# Patient Record
Sex: Male | Born: 1940 | Race: Black or African American | Hispanic: No | Marital: Married | Smoking: Former smoker
Health system: Southern US, Community
[De-identification: ages and names within clinical notes are randomized; demographics above are authoritative.]

## PROBLEM LIST (undated history)

## (undated) DIAGNOSIS — I2699 Other pulmonary embolism without acute cor pulmonale: Secondary | ICD-10-CM

## (undated) DIAGNOSIS — J90 Pleural effusion, not elsewhere classified: Secondary | ICD-10-CM

## (undated) DIAGNOSIS — I10 Essential (primary) hypertension: Secondary | ICD-10-CM

## (undated) DIAGNOSIS — E119 Type 2 diabetes mellitus without complications: Secondary | ICD-10-CM

## (undated) DIAGNOSIS — I82409 Acute embolism and thrombosis of unspecified deep veins of unspecified lower extremity: Secondary | ICD-10-CM

## (undated) HISTORY — PX: HERNIA REPAIR: SHX51

---

## 2015-10-26 DIAGNOSIS — G4733 Obstructive sleep apnea (adult) (pediatric): Secondary | ICD-10-CM | POA: Diagnosis not present

## 2015-10-26 DIAGNOSIS — Z6841 Body Mass Index (BMI) 40.0 and over, adult: Secondary | ICD-10-CM | POA: Diagnosis not present

## 2015-10-26 DIAGNOSIS — I10 Essential (primary) hypertension: Secondary | ICD-10-CM | POA: Diagnosis not present

## 2015-10-30 DIAGNOSIS — G4733 Obstructive sleep apnea (adult) (pediatric): Secondary | ICD-10-CM | POA: Diagnosis not present

## 2015-11-02 DIAGNOSIS — G4733 Obstructive sleep apnea (adult) (pediatric): Secondary | ICD-10-CM | POA: Diagnosis not present

## 2015-11-30 DIAGNOSIS — G4733 Obstructive sleep apnea (adult) (pediatric): Secondary | ICD-10-CM | POA: Diagnosis not present

## 2015-12-22 DIAGNOSIS — H26493 Other secondary cataract, bilateral: Secondary | ICD-10-CM | POA: Diagnosis not present

## 2015-12-22 DIAGNOSIS — H5213 Myopia, bilateral: Secondary | ICD-10-CM | POA: Diagnosis not present

## 2015-12-22 DIAGNOSIS — H40013 Open angle with borderline findings, low risk, bilateral: Secondary | ICD-10-CM | POA: Diagnosis not present

## 2015-12-22 DIAGNOSIS — H524 Presbyopia: Secondary | ICD-10-CM | POA: Diagnosis not present

## 2015-12-22 DIAGNOSIS — H52223 Regular astigmatism, bilateral: Secondary | ICD-10-CM | POA: Diagnosis not present

## 2015-12-28 DIAGNOSIS — G4733 Obstructive sleep apnea (adult) (pediatric): Secondary | ICD-10-CM | POA: Diagnosis not present

## 2016-01-28 DIAGNOSIS — G4733 Obstructive sleep apnea (adult) (pediatric): Secondary | ICD-10-CM | POA: Diagnosis not present

## 2016-02-27 DIAGNOSIS — G4733 Obstructive sleep apnea (adult) (pediatric): Secondary | ICD-10-CM | POA: Diagnosis not present

## 2016-03-29 DIAGNOSIS — G4733 Obstructive sleep apnea (adult) (pediatric): Secondary | ICD-10-CM | POA: Diagnosis not present

## 2016-04-12 DIAGNOSIS — J209 Acute bronchitis, unspecified: Secondary | ICD-10-CM | POA: Diagnosis not present

## 2016-04-12 DIAGNOSIS — I10 Essential (primary) hypertension: Secondary | ICD-10-CM | POA: Diagnosis not present

## 2016-04-12 DIAGNOSIS — Z6841 Body Mass Index (BMI) 40.0 and over, adult: Secondary | ICD-10-CM | POA: Diagnosis not present

## 2016-04-28 DIAGNOSIS — G4733 Obstructive sleep apnea (adult) (pediatric): Secondary | ICD-10-CM | POA: Diagnosis not present

## 2016-05-29 DIAGNOSIS — G4733 Obstructive sleep apnea (adult) (pediatric): Secondary | ICD-10-CM | POA: Diagnosis not present

## 2016-06-28 DIAGNOSIS — H26493 Other secondary cataract, bilateral: Secondary | ICD-10-CM | POA: Diagnosis not present

## 2016-06-28 DIAGNOSIS — H5213 Myopia, bilateral: Secondary | ICD-10-CM | POA: Diagnosis not present

## 2016-06-28 DIAGNOSIS — H524 Presbyopia: Secondary | ICD-10-CM | POA: Diagnosis not present

## 2016-06-28 DIAGNOSIS — H52223 Regular astigmatism, bilateral: Secondary | ICD-10-CM | POA: Diagnosis not present

## 2016-06-28 DIAGNOSIS — H40013 Open angle with borderline findings, low risk, bilateral: Secondary | ICD-10-CM | POA: Diagnosis not present

## 2016-06-29 DIAGNOSIS — G4733 Obstructive sleep apnea (adult) (pediatric): Secondary | ICD-10-CM | POA: Diagnosis not present

## 2016-07-29 DIAGNOSIS — G4733 Obstructive sleep apnea (adult) (pediatric): Secondary | ICD-10-CM | POA: Diagnosis not present

## 2016-08-01 DIAGNOSIS — G4733 Obstructive sleep apnea (adult) (pediatric): Secondary | ICD-10-CM | POA: Diagnosis not present

## 2016-08-29 DIAGNOSIS — G4733 Obstructive sleep apnea (adult) (pediatric): Secondary | ICD-10-CM | POA: Diagnosis not present

## 2016-09-28 DIAGNOSIS — G4733 Obstructive sleep apnea (adult) (pediatric): Secondary | ICD-10-CM | POA: Diagnosis not present

## 2016-10-29 DIAGNOSIS — G4733 Obstructive sleep apnea (adult) (pediatric): Secondary | ICD-10-CM | POA: Diagnosis not present

## 2016-11-05 DIAGNOSIS — M79672 Pain in left foot: Secondary | ICD-10-CM | POA: Diagnosis not present

## 2016-11-05 DIAGNOSIS — Z6841 Body Mass Index (BMI) 40.0 and over, adult: Secondary | ICD-10-CM | POA: Diagnosis not present

## 2016-12-27 DIAGNOSIS — H524 Presbyopia: Secondary | ICD-10-CM | POA: Diagnosis not present

## 2016-12-27 DIAGNOSIS — H40013 Open angle with borderline findings, low risk, bilateral: Secondary | ICD-10-CM | POA: Diagnosis not present

## 2016-12-27 DIAGNOSIS — H5213 Myopia, bilateral: Secondary | ICD-10-CM | POA: Diagnosis not present

## 2016-12-27 DIAGNOSIS — H26493 Other secondary cataract, bilateral: Secondary | ICD-10-CM | POA: Diagnosis not present

## 2016-12-27 DIAGNOSIS — H52223 Regular astigmatism, bilateral: Secondary | ICD-10-CM | POA: Diagnosis not present

## 2017-01-04 DIAGNOSIS — G4733 Obstructive sleep apnea (adult) (pediatric): Secondary | ICD-10-CM | POA: Diagnosis not present

## 2017-01-04 DIAGNOSIS — Z23 Encounter for immunization: Secondary | ICD-10-CM | POA: Diagnosis not present

## 2017-01-04 DIAGNOSIS — Z6841 Body Mass Index (BMI) 40.0 and over, adult: Secondary | ICD-10-CM | POA: Diagnosis not present

## 2017-01-04 DIAGNOSIS — H9313 Tinnitus, bilateral: Secondary | ICD-10-CM | POA: Diagnosis not present

## 2017-01-04 DIAGNOSIS — I10 Essential (primary) hypertension: Secondary | ICD-10-CM | POA: Diagnosis not present

## 2017-01-05 DIAGNOSIS — R7309 Other abnormal glucose: Secondary | ICD-10-CM | POA: Diagnosis not present

## 2017-01-09 DIAGNOSIS — E1165 Type 2 diabetes mellitus with hyperglycemia: Secondary | ICD-10-CM | POA: Diagnosis not present

## 2017-01-09 DIAGNOSIS — R739 Hyperglycemia, unspecified: Secondary | ICD-10-CM | POA: Diagnosis not present

## 2017-01-09 DIAGNOSIS — Z6841 Body Mass Index (BMI) 40.0 and over, adult: Secondary | ICD-10-CM | POA: Diagnosis not present

## 2017-01-09 DIAGNOSIS — I10 Essential (primary) hypertension: Secondary | ICD-10-CM | POA: Diagnosis not present

## 2017-01-12 DIAGNOSIS — Z6841 Body Mass Index (BMI) 40.0 and over, adult: Secondary | ICD-10-CM | POA: Diagnosis not present

## 2017-01-12 DIAGNOSIS — H903 Sensorineural hearing loss, bilateral: Secondary | ICD-10-CM | POA: Diagnosis not present

## 2017-01-12 DIAGNOSIS — H9193 Unspecified hearing loss, bilateral: Secondary | ICD-10-CM | POA: Diagnosis not present

## 2017-01-12 DIAGNOSIS — H9313 Tinnitus, bilateral: Secondary | ICD-10-CM | POA: Diagnosis not present

## 2017-02-01 DIAGNOSIS — E1165 Type 2 diabetes mellitus with hyperglycemia: Secondary | ICD-10-CM | POA: Diagnosis not present

## 2017-02-01 DIAGNOSIS — I10 Essential (primary) hypertension: Secondary | ICD-10-CM | POA: Diagnosis not present

## 2017-03-09 DIAGNOSIS — E1165 Type 2 diabetes mellitus with hyperglycemia: Secondary | ICD-10-CM | POA: Diagnosis not present

## 2017-03-09 DIAGNOSIS — Z6841 Body Mass Index (BMI) 40.0 and over, adult: Secondary | ICD-10-CM | POA: Diagnosis not present

## 2017-03-09 DIAGNOSIS — I10 Essential (primary) hypertension: Secondary | ICD-10-CM | POA: Diagnosis not present

## 2017-04-10 DIAGNOSIS — E784 Other hyperlipidemia: Secondary | ICD-10-CM | POA: Diagnosis not present

## 2017-04-10 DIAGNOSIS — E1165 Type 2 diabetes mellitus with hyperglycemia: Secondary | ICD-10-CM | POA: Diagnosis not present

## 2017-04-10 DIAGNOSIS — I10 Essential (primary) hypertension: Secondary | ICD-10-CM | POA: Diagnosis not present

## 2017-04-10 DIAGNOSIS — Z6841 Body Mass Index (BMI) 40.0 and over, adult: Secondary | ICD-10-CM | POA: Diagnosis not present

## 2017-07-04 DIAGNOSIS — H5213 Myopia, bilateral: Secondary | ICD-10-CM | POA: Diagnosis not present

## 2017-07-04 DIAGNOSIS — H26493 Other secondary cataract, bilateral: Secondary | ICD-10-CM | POA: Diagnosis not present

## 2017-07-04 DIAGNOSIS — H524 Presbyopia: Secondary | ICD-10-CM | POA: Diagnosis not present

## 2017-07-04 DIAGNOSIS — H52223 Regular astigmatism, bilateral: Secondary | ICD-10-CM | POA: Diagnosis not present

## 2017-07-04 DIAGNOSIS — H40013 Open angle with borderline findings, low risk, bilateral: Secondary | ICD-10-CM | POA: Diagnosis not present

## 2017-11-03 DIAGNOSIS — I10 Essential (primary) hypertension: Secondary | ICD-10-CM | POA: Diagnosis not present

## 2017-11-03 DIAGNOSIS — Z6841 Body Mass Index (BMI) 40.0 and over, adult: Secondary | ICD-10-CM | POA: Diagnosis not present

## 2017-11-03 DIAGNOSIS — Z87891 Personal history of nicotine dependence: Secondary | ICD-10-CM | POA: Diagnosis not present

## 2017-11-03 DIAGNOSIS — E785 Hyperlipidemia, unspecified: Secondary | ICD-10-CM | POA: Diagnosis not present

## 2017-11-03 DIAGNOSIS — E1165 Type 2 diabetes mellitus with hyperglycemia: Secondary | ICD-10-CM | POA: Diagnosis not present

## 2017-11-03 DIAGNOSIS — Z79899 Other long term (current) drug therapy: Secondary | ICD-10-CM | POA: Diagnosis not present

## 2017-11-29 DIAGNOSIS — G4733 Obstructive sleep apnea (adult) (pediatric): Secondary | ICD-10-CM | POA: Diagnosis not present

## 2017-12-05 DIAGNOSIS — G4733 Obstructive sleep apnea (adult) (pediatric): Secondary | ICD-10-CM | POA: Diagnosis not present

## 2018-01-02 DIAGNOSIS — H52223 Regular astigmatism, bilateral: Secondary | ICD-10-CM | POA: Diagnosis not present

## 2018-01-02 DIAGNOSIS — H26493 Other secondary cataract, bilateral: Secondary | ICD-10-CM | POA: Diagnosis not present

## 2018-01-02 DIAGNOSIS — H5213 Myopia, bilateral: Secondary | ICD-10-CM | POA: Diagnosis not present

## 2018-01-02 DIAGNOSIS — H40013 Open angle with borderline findings, low risk, bilateral: Secondary | ICD-10-CM | POA: Diagnosis not present

## 2018-01-02 DIAGNOSIS — H524 Presbyopia: Secondary | ICD-10-CM | POA: Diagnosis not present

## 2018-03-02 DIAGNOSIS — D125 Benign neoplasm of sigmoid colon: Secondary | ICD-10-CM | POA: Diagnosis not present

## 2018-03-02 DIAGNOSIS — K621 Rectal polyp: Secondary | ICD-10-CM | POA: Diagnosis not present

## 2018-03-02 DIAGNOSIS — D120 Benign neoplasm of cecum: Secondary | ICD-10-CM | POA: Diagnosis not present

## 2018-03-02 DIAGNOSIS — D123 Benign neoplasm of transverse colon: Secondary | ICD-10-CM | POA: Diagnosis not present

## 2018-03-02 DIAGNOSIS — Z1211 Encounter for screening for malignant neoplasm of colon: Secondary | ICD-10-CM | POA: Diagnosis not present

## 2018-03-02 DIAGNOSIS — K5730 Diverticulosis of large intestine without perforation or abscess without bleeding: Secondary | ICD-10-CM | POA: Diagnosis not present

## 2018-03-02 DIAGNOSIS — Z86010 Personal history of colonic polyps: Secondary | ICD-10-CM | POA: Diagnosis not present

## 2018-03-02 DIAGNOSIS — D124 Benign neoplasm of descending colon: Secondary | ICD-10-CM | POA: Diagnosis not present

## 2018-06-12 DIAGNOSIS — Z0000 Encounter for general adult medical examination without abnormal findings: Secondary | ICD-10-CM | POA: Diagnosis not present

## 2018-06-12 DIAGNOSIS — E785 Hyperlipidemia, unspecified: Secondary | ICD-10-CM | POA: Diagnosis not present

## 2018-06-12 DIAGNOSIS — Z87891 Personal history of nicotine dependence: Secondary | ICD-10-CM | POA: Diagnosis not present

## 2018-06-12 DIAGNOSIS — Z79899 Other long term (current) drug therapy: Secondary | ICD-10-CM | POA: Diagnosis not present

## 2018-06-12 DIAGNOSIS — Z7982 Long term (current) use of aspirin: Secondary | ICD-10-CM | POA: Diagnosis not present

## 2018-06-12 DIAGNOSIS — E1165 Type 2 diabetes mellitus with hyperglycemia: Secondary | ICD-10-CM | POA: Diagnosis not present

## 2018-06-12 DIAGNOSIS — I10 Essential (primary) hypertension: Secondary | ICD-10-CM | POA: Diagnosis not present

## 2018-07-06 DIAGNOSIS — R109 Unspecified abdominal pain: Secondary | ICD-10-CM | POA: Diagnosis not present

## 2018-07-06 DIAGNOSIS — B079 Viral wart, unspecified: Secondary | ICD-10-CM | POA: Diagnosis not present

## 2018-07-24 DIAGNOSIS — H26493 Other secondary cataract, bilateral: Secondary | ICD-10-CM | POA: Diagnosis not present

## 2018-07-24 DIAGNOSIS — H40013 Open angle with borderline findings, low risk, bilateral: Secondary | ICD-10-CM | POA: Diagnosis not present

## 2018-08-13 DIAGNOSIS — D234 Other benign neoplasm of skin of scalp and neck: Secondary | ICD-10-CM | POA: Diagnosis not present

## 2018-08-13 DIAGNOSIS — L821 Other seborrheic keratosis: Secondary | ICD-10-CM | POA: Diagnosis not present

## 2018-08-13 DIAGNOSIS — L918 Other hypertrophic disorders of the skin: Secondary | ICD-10-CM | POA: Diagnosis not present

## 2018-08-15 DIAGNOSIS — B078 Other viral warts: Secondary | ICD-10-CM | POA: Diagnosis not present

## 2018-09-14 DIAGNOSIS — G4733 Obstructive sleep apnea (adult) (pediatric): Secondary | ICD-10-CM | POA: Diagnosis not present

## 2018-12-12 DIAGNOSIS — E1165 Type 2 diabetes mellitus with hyperglycemia: Secondary | ICD-10-CM | POA: Diagnosis not present

## 2018-12-12 DIAGNOSIS — Z6841 Body Mass Index (BMI) 40.0 and over, adult: Secondary | ICD-10-CM | POA: Diagnosis not present

## 2018-12-12 DIAGNOSIS — E785 Hyperlipidemia, unspecified: Secondary | ICD-10-CM | POA: Diagnosis not present

## 2018-12-12 DIAGNOSIS — I10 Essential (primary) hypertension: Secondary | ICD-10-CM | POA: Diagnosis not present

## 2019-03-28 DIAGNOSIS — Z03818 Encounter for observation for suspected exposure to other biological agents ruled out: Secondary | ICD-10-CM | POA: Diagnosis not present

## 2019-04-02 DIAGNOSIS — H5213 Myopia, bilateral: Secondary | ICD-10-CM | POA: Diagnosis not present

## 2019-04-02 DIAGNOSIS — H40013 Open angle with borderline findings, low risk, bilateral: Secondary | ICD-10-CM | POA: Diagnosis not present

## 2019-04-02 DIAGNOSIS — H52223 Regular astigmatism, bilateral: Secondary | ICD-10-CM | POA: Diagnosis not present

## 2019-04-02 DIAGNOSIS — H524 Presbyopia: Secondary | ICD-10-CM | POA: Diagnosis not present

## 2019-04-02 DIAGNOSIS — H26493 Other secondary cataract, bilateral: Secondary | ICD-10-CM | POA: Diagnosis not present

## 2019-04-16 DIAGNOSIS — H26492 Other secondary cataract, left eye: Secondary | ICD-10-CM | POA: Diagnosis not present

## 2019-07-08 DIAGNOSIS — Z0000 Encounter for general adult medical examination without abnormal findings: Secondary | ICD-10-CM | POA: Diagnosis not present

## 2019-07-08 DIAGNOSIS — I10 Essential (primary) hypertension: Secondary | ICD-10-CM | POA: Diagnosis not present

## 2019-07-08 DIAGNOSIS — Z87891 Personal history of nicotine dependence: Secondary | ICD-10-CM | POA: Diagnosis not present

## 2019-07-08 DIAGNOSIS — E1165 Type 2 diabetes mellitus with hyperglycemia: Secondary | ICD-10-CM | POA: Diagnosis not present

## 2019-07-08 DIAGNOSIS — Z79899 Other long term (current) drug therapy: Secondary | ICD-10-CM | POA: Diagnosis not present

## 2019-07-08 DIAGNOSIS — G4733 Obstructive sleep apnea (adult) (pediatric): Secondary | ICD-10-CM | POA: Diagnosis not present

## 2019-07-08 DIAGNOSIS — E785 Hyperlipidemia, unspecified: Secondary | ICD-10-CM | POA: Diagnosis not present

## 2019-12-02 DIAGNOSIS — I10 Essential (primary) hypertension: Secondary | ICD-10-CM | POA: Diagnosis not present

## 2019-12-02 DIAGNOSIS — M792 Neuralgia and neuritis, unspecified: Secondary | ICD-10-CM | POA: Diagnosis not present

## 2019-12-02 DIAGNOSIS — M10072 Idiopathic gout, left ankle and foot: Secondary | ICD-10-CM | POA: Diagnosis not present

## 2019-12-02 DIAGNOSIS — M79673 Pain in unspecified foot: Secondary | ICD-10-CM | POA: Diagnosis not present

## 2019-12-02 DIAGNOSIS — M79672 Pain in left foot: Secondary | ICD-10-CM | POA: Diagnosis not present

## 2019-12-12 DIAGNOSIS — E782 Mixed hyperlipidemia: Secondary | ICD-10-CM | POA: Diagnosis not present

## 2019-12-12 DIAGNOSIS — E1165 Type 2 diabetes mellitus with hyperglycemia: Secondary | ICD-10-CM | POA: Diagnosis not present

## 2019-12-12 DIAGNOSIS — I10 Essential (primary) hypertension: Secondary | ICD-10-CM | POA: Diagnosis not present

## 2020-01-23 DIAGNOSIS — H35033 Hypertensive retinopathy, bilateral: Secondary | ICD-10-CM | POA: Diagnosis not present

## 2020-01-23 DIAGNOSIS — H43812 Vitreous degeneration, left eye: Secondary | ICD-10-CM | POA: Diagnosis not present

## 2020-01-23 DIAGNOSIS — H40023 Open angle with borderline findings, high risk, bilateral: Secondary | ICD-10-CM | POA: Diagnosis not present

## 2020-01-23 DIAGNOSIS — Z961 Presence of intraocular lens: Secondary | ICD-10-CM | POA: Diagnosis not present

## 2020-04-21 DIAGNOSIS — I10 Essential (primary) hypertension: Secondary | ICD-10-CM | POA: Diagnosis not present

## 2020-05-13 DIAGNOSIS — E1165 Type 2 diabetes mellitus with hyperglycemia: Secondary | ICD-10-CM | POA: Diagnosis not present

## 2020-05-13 DIAGNOSIS — I10 Essential (primary) hypertension: Secondary | ICD-10-CM | POA: Diagnosis not present

## 2020-05-13 DIAGNOSIS — E782 Mixed hyperlipidemia: Secondary | ICD-10-CM | POA: Diagnosis not present

## 2020-05-13 DIAGNOSIS — Z6841 Body Mass Index (BMI) 40.0 and over, adult: Secondary | ICD-10-CM | POA: Diagnosis not present

## 2020-05-15 DIAGNOSIS — H524 Presbyopia: Secondary | ICD-10-CM | POA: Diagnosis not present

## 2020-05-24 DIAGNOSIS — R040 Epistaxis: Secondary | ICD-10-CM | POA: Diagnosis not present

## 2020-05-27 DIAGNOSIS — R040 Epistaxis: Secondary | ICD-10-CM | POA: Diagnosis not present

## 2020-06-02 DIAGNOSIS — R040 Epistaxis: Secondary | ICD-10-CM | POA: Diagnosis not present

## 2020-06-03 DIAGNOSIS — R040 Epistaxis: Secondary | ICD-10-CM | POA: Diagnosis not present

## 2020-06-04 DIAGNOSIS — E1165 Type 2 diabetes mellitus with hyperglycemia: Secondary | ICD-10-CM | POA: Diagnosis not present

## 2020-06-04 DIAGNOSIS — E782 Mixed hyperlipidemia: Secondary | ICD-10-CM | POA: Diagnosis not present

## 2020-06-04 DIAGNOSIS — G4733 Obstructive sleep apnea (adult) (pediatric): Secondary | ICD-10-CM | POA: Diagnosis not present

## 2020-06-04 DIAGNOSIS — I10 Essential (primary) hypertension: Secondary | ICD-10-CM | POA: Diagnosis not present

## 2020-06-04 DIAGNOSIS — R040 Epistaxis: Secondary | ICD-10-CM | POA: Diagnosis not present

## 2020-06-23 DIAGNOSIS — H9313 Tinnitus, bilateral: Secondary | ICD-10-CM | POA: Diagnosis not present

## 2020-06-23 DIAGNOSIS — R040 Epistaxis: Secondary | ICD-10-CM | POA: Diagnosis not present

## 2020-08-20 DIAGNOSIS — Z961 Presence of intraocular lens: Secondary | ICD-10-CM | POA: Diagnosis not present

## 2020-08-20 DIAGNOSIS — H43812 Vitreous degeneration, left eye: Secondary | ICD-10-CM | POA: Diagnosis not present

## 2020-08-20 DIAGNOSIS — H40023 Open angle with borderline findings, high risk, bilateral: Secondary | ICD-10-CM | POA: Diagnosis not present

## 2020-08-20 DIAGNOSIS — H35033 Hypertensive retinopathy, bilateral: Secondary | ICD-10-CM | POA: Diagnosis not present

## 2020-09-01 DIAGNOSIS — R040 Epistaxis: Secondary | ICD-10-CM | POA: Diagnosis not present

## 2020-11-04 DIAGNOSIS — R040 Epistaxis: Secondary | ICD-10-CM | POA: Diagnosis not present

## 2020-11-04 DIAGNOSIS — H938X3 Other specified disorders of ear, bilateral: Secondary | ICD-10-CM | POA: Diagnosis not present

## 2020-11-04 DIAGNOSIS — Z7982 Long term (current) use of aspirin: Secondary | ICD-10-CM | POA: Diagnosis not present

## 2020-11-05 DIAGNOSIS — R040 Epistaxis: Secondary | ICD-10-CM | POA: Diagnosis not present

## 2020-12-09 DIAGNOSIS — Z8249 Family history of ischemic heart disease and other diseases of the circulatory system: Secondary | ICD-10-CM | POA: Diagnosis not present

## 2020-12-09 DIAGNOSIS — E1165 Type 2 diabetes mellitus with hyperglycemia: Secondary | ICD-10-CM | POA: Diagnosis not present

## 2020-12-09 DIAGNOSIS — Z86010 Personal history of colonic polyps: Secondary | ICD-10-CM | POA: Diagnosis not present

## 2020-12-09 DIAGNOSIS — E782 Mixed hyperlipidemia: Secondary | ICD-10-CM | POA: Diagnosis not present

## 2020-12-09 DIAGNOSIS — I10 Essential (primary) hypertension: Secondary | ICD-10-CM | POA: Diagnosis not present

## 2020-12-09 DIAGNOSIS — Z7984 Long term (current) use of oral hypoglycemic drugs: Secondary | ICD-10-CM | POA: Diagnosis not present

## 2020-12-09 DIAGNOSIS — Z6841 Body Mass Index (BMI) 40.0 and over, adult: Secondary | ICD-10-CM | POA: Diagnosis not present

## 2020-12-09 DIAGNOSIS — Z0000 Encounter for general adult medical examination without abnormal findings: Secondary | ICD-10-CM | POA: Diagnosis not present

## 2020-12-22 DIAGNOSIS — J3489 Other specified disorders of nose and nasal sinuses: Secondary | ICD-10-CM | POA: Diagnosis not present

## 2020-12-22 DIAGNOSIS — R040 Epistaxis: Secondary | ICD-10-CM | POA: Diagnosis not present

## 2021-01-05 DIAGNOSIS — R040 Epistaxis: Secondary | ICD-10-CM | POA: Diagnosis not present

## 2021-01-13 DIAGNOSIS — E119 Type 2 diabetes mellitus without complications: Secondary | ICD-10-CM | POA: Diagnosis not present

## 2021-01-13 DIAGNOSIS — Z6841 Body Mass Index (BMI) 40.0 and over, adult: Secondary | ICD-10-CM | POA: Diagnosis not present

## 2021-01-13 DIAGNOSIS — Z1211 Encounter for screening for malignant neoplasm of colon: Secondary | ICD-10-CM | POA: Diagnosis not present

## 2021-01-13 DIAGNOSIS — Z86010 Personal history of colonic polyps: Secondary | ICD-10-CM | POA: Diagnosis not present

## 2021-01-13 DIAGNOSIS — E669 Obesity, unspecified: Secondary | ICD-10-CM | POA: Diagnosis not present

## 2021-01-13 DIAGNOSIS — K648 Other hemorrhoids: Secondary | ICD-10-CM | POA: Diagnosis not present

## 2021-01-13 DIAGNOSIS — G4730 Sleep apnea, unspecified: Secondary | ICD-10-CM | POA: Diagnosis not present

## 2021-01-13 DIAGNOSIS — K5730 Diverticulosis of large intestine without perforation or abscess without bleeding: Secondary | ICD-10-CM | POA: Diagnosis not present

## 2021-01-13 DIAGNOSIS — Z7984 Long term (current) use of oral hypoglycemic drugs: Secondary | ICD-10-CM | POA: Diagnosis not present

## 2021-02-18 DIAGNOSIS — H43812 Vitreous degeneration, left eye: Secondary | ICD-10-CM | POA: Diagnosis not present

## 2021-02-18 DIAGNOSIS — Z961 Presence of intraocular lens: Secondary | ICD-10-CM | POA: Diagnosis not present

## 2021-02-18 DIAGNOSIS — H40023 Open angle with borderline findings, high risk, bilateral: Secondary | ICD-10-CM | POA: Diagnosis not present

## 2021-02-18 DIAGNOSIS — H524 Presbyopia: Secondary | ICD-10-CM | POA: Diagnosis not present

## 2021-02-18 DIAGNOSIS — H35033 Hypertensive retinopathy, bilateral: Secondary | ICD-10-CM | POA: Diagnosis not present

## 2021-05-01 DIAGNOSIS — K219 Gastro-esophageal reflux disease without esophagitis: Secondary | ICD-10-CM | POA: Diagnosis not present

## 2021-05-04 DIAGNOSIS — K219 Gastro-esophageal reflux disease without esophagitis: Secondary | ICD-10-CM | POA: Diagnosis not present

## 2021-05-04 DIAGNOSIS — I10 Essential (primary) hypertension: Secondary | ICD-10-CM | POA: Diagnosis not present

## 2021-05-04 DIAGNOSIS — E782 Mixed hyperlipidemia: Secondary | ICD-10-CM | POA: Diagnosis not present

## 2021-05-04 DIAGNOSIS — G4733 Obstructive sleep apnea (adult) (pediatric): Secondary | ICD-10-CM | POA: Diagnosis not present

## 2021-05-04 DIAGNOSIS — E1165 Type 2 diabetes mellitus with hyperglycemia: Secondary | ICD-10-CM | POA: Diagnosis not present

## 2021-05-20 DIAGNOSIS — J90 Pleural effusion, not elsewhere classified: Secondary | ICD-10-CM | POA: Diagnosis not present

## 2021-05-20 DIAGNOSIS — G4733 Obstructive sleep apnea (adult) (pediatric): Secondary | ICD-10-CM | POA: Diagnosis not present

## 2021-05-20 DIAGNOSIS — I10 Essential (primary) hypertension: Secondary | ICD-10-CM | POA: Diagnosis not present

## 2021-05-20 DIAGNOSIS — K8020 Calculus of gallbladder without cholecystitis without obstruction: Secondary | ICD-10-CM | POA: Diagnosis not present

## 2021-05-20 DIAGNOSIS — K219 Gastro-esophageal reflux disease without esophagitis: Secondary | ICD-10-CM | POA: Diagnosis not present

## 2021-05-20 DIAGNOSIS — R112 Nausea with vomiting, unspecified: Secondary | ICD-10-CM | POA: Diagnosis not present

## 2021-05-20 DIAGNOSIS — R1011 Right upper quadrant pain: Secondary | ICD-10-CM | POA: Diagnosis not present

## 2021-05-20 DIAGNOSIS — R634 Abnormal weight loss: Secondary | ICD-10-CM | POA: Diagnosis not present

## 2021-05-20 DIAGNOSIS — Z86010 Personal history of colonic polyps: Secondary | ICD-10-CM | POA: Diagnosis not present

## 2021-05-25 ENCOUNTER — Other Ambulatory Visit: Payer: Self-pay

## 2021-05-25 ENCOUNTER — Emergency Department (HOSPITAL_BASED_OUTPATIENT_CLINIC_OR_DEPARTMENT_OTHER): Payer: PPO

## 2021-05-25 ENCOUNTER — Inpatient Hospital Stay (HOSPITAL_BASED_OUTPATIENT_CLINIC_OR_DEPARTMENT_OTHER)
Admission: EM | Admit: 2021-05-25 | Discharge: 2021-05-27 | DRG: 180 | Disposition: A | Discharge status: Home Health | Payer: PPO | Attending: Internal Medicine | Admitting: Internal Medicine

## 2021-05-25 ENCOUNTER — Encounter (HOSPITAL_BASED_OUTPATIENT_CLINIC_OR_DEPARTMENT_OTHER): Payer: Self-pay

## 2021-05-25 DIAGNOSIS — N179 Acute kidney failure, unspecified: Secondary | ICD-10-CM | POA: Diagnosis present

## 2021-05-25 DIAGNOSIS — N1831 Chronic kidney disease, stage 3a: Secondary | ICD-10-CM | POA: Diagnosis present

## 2021-05-25 DIAGNOSIS — K8020 Calculus of gallbladder without cholecystitis without obstruction: Secondary | ICD-10-CM | POA: Diagnosis not present

## 2021-05-25 DIAGNOSIS — R0602 Shortness of breath: Secondary | ICD-10-CM

## 2021-05-25 DIAGNOSIS — Z79899 Other long term (current) drug therapy: Secondary | ICD-10-CM

## 2021-05-25 DIAGNOSIS — R1011 Right upper quadrant pain: Secondary | ICD-10-CM | POA: Diagnosis not present

## 2021-05-25 DIAGNOSIS — J9811 Atelectasis: Secondary | ICD-10-CM | POA: Diagnosis present

## 2021-05-25 DIAGNOSIS — I517 Cardiomegaly: Secondary | ICD-10-CM | POA: Diagnosis not present

## 2021-05-25 DIAGNOSIS — J9601 Acute respiratory failure with hypoxia: Secondary | ICD-10-CM | POA: Diagnosis present

## 2021-05-25 DIAGNOSIS — G4733 Obstructive sleep apnea (adult) (pediatric): Secondary | ICD-10-CM | POA: Diagnosis not present

## 2021-05-25 DIAGNOSIS — D75839 Thrombocytosis, unspecified: Secondary | ICD-10-CM | POA: Diagnosis present

## 2021-05-25 DIAGNOSIS — D631 Anemia in chronic kidney disease: Secondary | ICD-10-CM | POA: Diagnosis present

## 2021-05-25 DIAGNOSIS — Z6841 Body Mass Index (BMI) 40.0 and over, adult: Secondary | ICD-10-CM | POA: Diagnosis not present

## 2021-05-25 DIAGNOSIS — R918 Other nonspecific abnormal finding of lung field: Secondary | ICD-10-CM

## 2021-05-25 DIAGNOSIS — J910 Malignant pleural effusion: Secondary | ICD-10-CM | POA: Diagnosis not present

## 2021-05-25 DIAGNOSIS — C384 Malignant neoplasm of pleura: Secondary | ICD-10-CM | POA: Diagnosis not present

## 2021-05-25 DIAGNOSIS — Z20822 Contact with and (suspected) exposure to covid-19: Secondary | ICD-10-CM | POA: Diagnosis not present

## 2021-05-25 DIAGNOSIS — I2694 Multiple subsegmental pulmonary emboli without acute cor pulmonale: Secondary | ICD-10-CM | POA: Diagnosis not present

## 2021-05-25 DIAGNOSIS — J449 Chronic obstructive pulmonary disease, unspecified: Secondary | ICD-10-CM | POA: Diagnosis not present

## 2021-05-25 DIAGNOSIS — I2699 Other pulmonary embolism without acute cor pulmonale: Secondary | ICD-10-CM

## 2021-05-25 DIAGNOSIS — I129 Hypertensive chronic kidney disease with stage 1 through stage 4 chronic kidney disease, or unspecified chronic kidney disease: Secondary | ICD-10-CM | POA: Diagnosis present

## 2021-05-25 DIAGNOSIS — Z87891 Personal history of nicotine dependence: Secondary | ICD-10-CM | POA: Diagnosis not present

## 2021-05-25 DIAGNOSIS — Z86711 Personal history of pulmonary embolism: Secondary | ICD-10-CM | POA: Diagnosis not present

## 2021-05-25 DIAGNOSIS — I10 Essential (primary) hypertension: Secondary | ICD-10-CM | POA: Diagnosis not present

## 2021-05-25 DIAGNOSIS — K219 Gastro-esophageal reflux disease without esophagitis: Secondary | ICD-10-CM | POA: Diagnosis present

## 2021-05-25 DIAGNOSIS — Z7984 Long term (current) use of oral hypoglycemic drugs: Secondary | ICD-10-CM | POA: Diagnosis not present

## 2021-05-25 DIAGNOSIS — C3491 Malignant neoplasm of unspecified part of right bronchus or lung: Secondary | ICD-10-CM | POA: Diagnosis not present

## 2021-05-25 DIAGNOSIS — E1122 Type 2 diabetes mellitus with diabetic chronic kidney disease: Secondary | ICD-10-CM | POA: Diagnosis not present

## 2021-05-25 DIAGNOSIS — R0781 Pleurodynia: Secondary | ICD-10-CM | POA: Diagnosis not present

## 2021-05-25 DIAGNOSIS — Z48813 Encounter for surgical aftercare following surgery on the respiratory system: Secondary | ICD-10-CM | POA: Diagnosis not present

## 2021-05-25 DIAGNOSIS — J90 Pleural effusion, not elsewhere classified: Secondary | ICD-10-CM

## 2021-05-25 DIAGNOSIS — I82452 Acute embolism and thrombosis of left peroneal vein: Secondary | ICD-10-CM | POA: Diagnosis present

## 2021-05-25 DIAGNOSIS — I2693 Single subsegmental pulmonary embolism without acute cor pulmonale: Secondary | ICD-10-CM | POA: Diagnosis not present

## 2021-05-25 DIAGNOSIS — J811 Chronic pulmonary edema: Secondary | ICD-10-CM | POA: Diagnosis not present

## 2021-05-25 DIAGNOSIS — Z9889 Other specified postprocedural states: Secondary | ICD-10-CM

## 2021-05-25 HISTORY — DX: Essential (primary) hypertension: I10

## 2021-05-25 HISTORY — DX: Type 2 diabetes mellitus without complications: E11.9

## 2021-05-25 LAB — COMPREHENSIVE METABOLIC PANEL
ALT: 27 U/L (ref 0–44)
AST: 43 U/L — ABNORMAL HIGH (ref 15–41)
Albumin: 2.3 g/dL — ABNORMAL LOW (ref 3.5–5.0)
Alkaline Phosphatase: 120 U/L (ref 38–126)
Anion gap: 13 (ref 5–15)
BUN: 35 mg/dL — ABNORMAL HIGH (ref 8–23)
CO2: 19 mmol/L — ABNORMAL LOW (ref 22–32)
Calcium: 8.8 mg/dL — ABNORMAL LOW (ref 8.9–10.3)
Chloride: 105 mmol/L (ref 98–111)
Creatinine, Ser: 1.83 mg/dL — ABNORMAL HIGH (ref 0.61–1.24)
GFR, Estimated: 37 mL/min — ABNORMAL LOW (ref >60)
Glucose, Bld: 106 mg/dL — ABNORMAL HIGH (ref 70–99)
Potassium: 4.5 mmol/L (ref 3.5–5.1)
Sodium: 137 mmol/L (ref 135–145)
Total Bilirubin: 1.1 mg/dL (ref 0.3–1.2)
Total Protein: 6.8 g/dL (ref 6.5–8.1)

## 2021-05-25 LAB — GLUCOSE, CAPILLARY
Glucose-Capillary: 95 mg/dL (ref 70–99)
Glucose-Capillary: 120 mg/dL — ABNORMAL HIGH (ref 70–99)

## 2021-05-25 LAB — CBC WITH DIFFERENTIAL/PLATELET
Abs Immature Granulocytes: 0.11 10*3/uL — ABNORMAL HIGH (ref 0.00–0.07)
Basophils Absolute: 0.1 10*3/uL (ref 0.0–0.1)
Basophils Relative: 1 %
Eosinophils Absolute: 0.6 10*3/uL — ABNORMAL HIGH (ref 0.0–0.5)
Eosinophils Relative: 6 %
HCT: 36.2 % — ABNORMAL LOW (ref 39.0–52.0)
Hemoglobin: 12 g/dL — ABNORMAL LOW (ref 13.0–17.0)
Immature Granulocytes: 1 %
Lymphocytes Relative: 11 %
Lymphs Abs: 1.2 10*3/uL (ref 0.7–4.0)
MCH: 28.7 pg (ref 26.0–34.0)
MCHC: 33.1 g/dL (ref 30.0–36.0)
MCV: 86.6 fL (ref 80.0–100.0)
Monocytes Absolute: 1.3 10*3/uL — ABNORMAL HIGH (ref 0.1–1.0)
Monocytes Relative: 12 %
Neutro Abs: 7.3 10*3/uL (ref 1.7–7.7)
Neutrophils Relative %: 69 %
Platelets: 472 10*3/uL — ABNORMAL HIGH (ref 150–400)
RBC: 4.18 MIL/uL — ABNORMAL LOW (ref 4.22–5.81)
RDW: 14.6 % (ref 11.5–15.5)
WBC: 10.5 10*3/uL (ref 4.0–10.5)
nRBC: 0 % (ref 0.0–0.2)

## 2021-05-25 LAB — APTT: aPTT: 31 seconds (ref 24–36)

## 2021-05-25 LAB — CBC
HCT: 35.7 % — ABNORMAL LOW (ref 39.0–52.0)
Hemoglobin: 11.4 g/dL — ABNORMAL LOW (ref 13.0–17.0)
MCH: 28.2 pg (ref 26.0–34.0)
MCHC: 31.9 g/dL (ref 30.0–36.0)
MCV: 88.4 fL (ref 80.0–100.0)
Platelets: 495 10*3/uL — ABNORMAL HIGH (ref 150–400)
RBC: 4.04 MIL/uL — ABNORMAL LOW (ref 4.22–5.81)
RDW: 14.8 % (ref 11.5–15.5)
WBC: 9.7 10*3/uL (ref 4.0–10.5)
nRBC: 0 % (ref 0.0–0.2)

## 2021-05-25 LAB — RESP PANEL BY RT-PCR (FLU A&B, COVID) ARPGX2
Influenza A by PCR: NEGATIVE
Influenza B by PCR: NEGATIVE
SARS Coronavirus 2 by RT PCR: NEGATIVE

## 2021-05-25 LAB — HEMOGLOBIN A1C
Hgb A1c MFr Bld: 6.8 % — ABNORMAL HIGH (ref 4.8–5.6)
Mean Plasma Glucose: 148.46 mg/dL

## 2021-05-25 LAB — TROPONIN I (HIGH SENSITIVITY)
Troponin I (High Sensitivity): 9 ng/L (ref <18)
Troponin I (High Sensitivity): 7 ng/L (ref <18)

## 2021-05-25 LAB — PROTIME-INR
INR: 1.3 — ABNORMAL HIGH (ref 0.8–1.2)
Prothrombin Time: 16 seconds — ABNORMAL HIGH (ref 11.4–15.2)

## 2021-05-25 LAB — BRAIN NATRIURETIC PEPTIDE: B Natriuretic Peptide: 10.9 pg/mL (ref 0.0–100.0)

## 2021-05-25 LAB — HEPARIN LEVEL (UNFRACTIONATED)
Heparin Unfractionated: 0.19 IU/mL — ABNORMAL LOW (ref 0.30–0.70)
Heparin Unfractionated: 0.41 IU/mL (ref 0.30–0.70)

## 2021-05-25 LAB — CREATININE, SERUM
Creatinine, Ser: 1.58 mg/dL — ABNORMAL HIGH (ref 0.61–1.24)
GFR, Estimated: 44 mL/min — ABNORMAL LOW (ref >60)

## 2021-05-25 MED ORDER — ACETAMINOPHEN 325 MG PO TABS
650.0000 mg | ORAL_TABLET | Freq: Four times a day (QID) | ORAL | Status: DC | PRN
  Administered 2021-05-26 (×3): 650 mg via ORAL
  Filled 2021-05-25 (×3): qty 2

## 2021-05-25 MED ORDER — SALINE SPRAY 0.65 % NA SOLN
1.0000 | Freq: Every day | NASAL | Status: DC
  Administered 2021-05-25 – 2021-05-26 (×2): 1 via NASAL
  Filled 2021-05-25: qty 44

## 2021-05-25 MED ORDER — ONDANSETRON HCL 4 MG/2ML IJ SOLN: 4.0000 mg | Freq: Four times a day (QID) | INTRAMUSCULAR | Status: DC | PRN

## 2021-05-25 MED ORDER — HEPARIN BOLUS VIA INFUSION
7000.0000 [IU] | Freq: Once | INTRAVENOUS | Status: AC
  Administered 2021-05-25: 7000 [IU] via INTRAVENOUS

## 2021-05-25 MED ORDER — ACETAMINOPHEN 650 MG RE SUPP: 650.0000 mg | Freq: Four times a day (QID) | RECTAL | Status: DC | PRN

## 2021-05-25 MED ORDER — ONDANSETRON HCL 4 MG PO TABS: 4.0000 mg | ORAL_TABLET | Freq: Four times a day (QID) | ORAL | Status: DC | PRN

## 2021-05-25 MED ORDER — GUAIFENESIN ER 600 MG PO TB12
600.0000 mg | ORAL_TABLET | Freq: Two times a day (BID) | ORAL | Status: DC
  Administered 2021-05-25 – 2021-05-27 (×4): 600 mg via ORAL
  Filled 2021-05-25 (×4): qty 1

## 2021-05-25 MED ORDER — INSULIN ASPART 100 UNIT/ML IJ SOLN: 0.0000 [IU] | Freq: Three times a day (TID) | INTRAMUSCULAR | Status: DC

## 2021-05-25 MED ORDER — SODIUM CHLORIDE 0.9 % IV SOLN: INTRAVENOUS | Status: DC

## 2021-05-25 MED ORDER — ALBUTEROL SULFATE (2.5 MG/3ML) 0.083% IN NEBU
2.5000 mg | INHALATION_SOLUTION | Freq: Four times a day (QID) | RESPIRATORY_TRACT | Status: DC
  Administered 2021-05-25: 2.5 mg via RESPIRATORY_TRACT
  Filled 2021-05-25: qty 3

## 2021-05-25 MED ORDER — REVEFENACIN 175 MCG/3ML IN SOLN
175.0000 ug | Freq: Every day | RESPIRATORY_TRACT | Status: DC
  Administered 2021-05-25 – 2021-05-26 (×2): 175 ug via RESPIRATORY_TRACT
  Filled 2021-05-25 (×3): qty 3

## 2021-05-25 MED ORDER — SIMVASTATIN 20 MG PO TABS
10.0000 mg | ORAL_TABLET | Freq: Every day | ORAL | Status: DC
  Administered 2021-05-25 – 2021-05-27 (×3): 10 mg via ORAL
  Filled 2021-05-25 (×3): qty 1

## 2021-05-25 MED ORDER — IOHEXOL 350 MG/ML SOLN
100.0000 mL | Freq: Once | INTRAVENOUS | Status: AC | PRN
  Administered 2021-05-25: 80 mL via INTRAVENOUS

## 2021-05-25 MED ORDER — ARFORMOTEROL TARTRATE 15 MCG/2ML IN NEBU
15.0000 ug | INHALATION_SOLUTION | Freq: Two times a day (BID) | RESPIRATORY_TRACT | Status: DC
  Administered 2021-05-25 – 2021-05-27 (×4): 15 ug via RESPIRATORY_TRACT
  Filled 2021-05-25 (×4): qty 2

## 2021-05-25 MED ORDER — BUDESONIDE 0.25 MG/2ML IN SUSP
0.2500 mg | Freq: Two times a day (BID) | RESPIRATORY_TRACT | Status: DC
  Administered 2021-05-25 – 2021-05-27 (×4): 0.25 mg via RESPIRATORY_TRACT
  Filled 2021-05-25 (×4): qty 2

## 2021-05-25 MED ORDER — ALBUTEROL SULFATE (2.5 MG/3ML) 0.083% IN NEBU
2.5000 mg | INHALATION_SOLUTION | Freq: Three times a day (TID) | RESPIRATORY_TRACT | Status: DC
Start: 2021-05-26 — End: 2021-05-27
  Administered 2021-05-26 – 2021-05-27 (×4): 2.5 mg via RESPIRATORY_TRACT
  Filled 2021-05-25 (×4): qty 3

## 2021-05-25 MED ORDER — PANTOPRAZOLE SODIUM 40 MG PO TBEC
40.0000 mg | DELAYED_RELEASE_TABLET | Freq: Every day | ORAL | Status: DC
  Administered 2021-05-25 – 2021-05-27 (×3): 40 mg via ORAL
  Filled 2021-05-25 (×3): qty 1

## 2021-05-25 MED ORDER — HEPARIN (PORCINE) 25000 UT/250ML-% IV SOLN
1900.0000 [IU]/h | INTRAVENOUS | Status: DC
  Administered 2021-05-25 – 2021-05-26 (×2): 1600 [IU]/h via INTRAVENOUS
  Filled 2021-05-25 (×3): qty 250

## 2021-05-25 NOTE — Progress Notes (Signed)
ANTICOAGULATION CONSULT NOTE - Initial Consult  Pharmacy Consult for heparin Indication: pulmonary embolus  No Known Allergies  Patient Measurements: Height: 5\' 7"  (170.2 cm) Weight: 118.8 kg (262 lb) IBW/kg (Calculated) : 66.1 Heparin Dosing Weight: 93.5 kg  Vital Signs: Temp: 97.9 F (36.6 C) (08/23 0924) Temp Source: Oral (08/23 0924) BP: 119/68 (08/23 1115) Pulse Rate: 80 (08/23 1115)  Labs: Recent Labs    05/25/21 0947  HGB 12.0*  HCT 36.2*  PLT 472*  CREATININE 1.83*  TROPONINIHS 9    Estimated Creatinine Clearance: 41 mL/min (A) (by C-G formula based on SCr of 1.83 mg/dL (H)).   Medical History: Past Medical History:  Diagnosis Date   Diabetes mellitus without complication ([place])    Hypertension    Assessment: [name] with PMHx of DM and HTN. Arrives to ED with SOB for past week, with increased WOB. CXR with effusion, with CTA chest which showed PE with segmental to subsegmental bolus in RUL. No elevation of RV:LV. Also found to have large mass in R middle lobe.    Goal of Therapy:  Heparin level 0.3-0.7 units/ml Monitor platelets by anticoagulation protocol: Yes   Plan:  Give 7000 units bolus x 1 Start heparin infusion at 1600 units/hr Check anti-Xa level in 6-8 hours and daily while on heparin Continue to monitor H&H and platelets  Joetta Manners, PharmD, [facility] Medicine Clinical Pharmacist ED RPh Phone: [place]: [phone]

## 2021-05-25 NOTE — ED Notes (Signed)
[facility]  Here to get pt

## 2021-05-25 NOTE — ED Provider Notes (Signed)
[facility]ERGENCY DEPARTMENT Provider Note   CSN: [id number] Arrival date & time: 05/25/21  5885     History Chief Complaint  Patient presents with   Shortness of Breath    Kevin Hobbs is a 80 y.o. male.  The history is provided by the patient.  Shortness of Breath Severity:  Mild Onset quality:  Gradual Duration:  1 week Timing:  Constant Progression:  Unchanged Chronicity:  New Context: not URI   Context comment:  Patient has been coughing.  Negative rapid COVID test.  Had ultrasound yesterday of his gallbladder that showed some small pleural effusion.  He is undergoing treatment for H. pylori.  Not having any abdominal pain or chest pain. Relieved by:  Nothing Worsened by:  Nothing Associated symptoms: cough   Associated symptoms: no abdominal pain, no chest pain, no ear pain, no fever, no rash, no sore throat, no sputum production and no vomiting   Risk factors: no hx of PE/DVT       Past Medical History:  Diagnosis Date   Diabetes mellitus without complication ([place])    Hypertension     There are no problems to display for this patient.   Past Surgical History:  Procedure Laterality Date   HERNIA REPAIR         History reviewed. No pertinent family history.  Social History   Tobacco Use   Smoking status: Former    Types: Cigarettes   Smokeless tobacco: Never  Substance Use Topics   Alcohol use: Not Currently   Drug use: Never    Home Medications Prior to Admission medications   Medication Sig Start Date End Date Taking? Authorizing Provider  amLODipine (NORVASC) 10 MG tablet Take 10 mg by mouth daily. 05/04/21   [provider]  amoxicillin (AMOXIL) 500 MG tablet Take 1,000 mg by mouth 2 (two) times daily. 05/24/21   [provider]  losartan (COZAAR) 100 MG tablet Take 100 mg by mouth daily. 03/29/21   [provider]  metFORMIN (GLUCOPHAGE-XR) 500 MG 24 hr tablet Take 500 mg by mouth 2 (two) times  daily. 02/28/21   [provider]  pantoprazole (PROTONIX) 40 MG tablet Take 40 mg by mouth daily. 05/04/21   [provider]  simvastatin (ZOCOR) 10 MG tablet Take 10 mg by mouth daily as needed. 05/04/21   [provider]    Allergies    Patient has no known allergies.  Review of Systems   Review of Systems  Constitutional:  Negative for chills and fever.  HENT:  Negative for ear pain and sore throat.   Eyes:  Negative for pain and visual disturbance.  Respiratory:  Positive for cough and shortness of breath. Negative for sputum production.   Cardiovascular:  Negative for chest pain, palpitations and leg swelling.  Gastrointestinal:  Negative for abdominal pain and vomiting.  Genitourinary:  Negative for dysuria and hematuria.  Musculoskeletal:  Negative for arthralgias and back pain.  Skin:  Negative for color change and rash.  Neurological:  Negative for seizures and syncope.  All other systems reviewed and are negative.  Physical Exam Updated Vital Signs BP 119/68   Pulse 80   Temp 97.9 F (36.6 C) (Oral)   Resp (!) 21   Ht 5\' 7"  (1.702 m)   Wt 118.8 kg   SpO2 93%   BMI 41.04 kg/m   Physical Exam Vitals and nursing note reviewed.  Constitutional:      General: He is not  in acute distress.    Appearance: He is well-developed. He is not ill-appearing.  HENT:     Head: Normocephalic and atraumatic.  Eyes:     Extraocular Movements: Extraocular movements intact.     Conjunctiva/sclera: Conjunctivae normal.     Pupils: Pupils are equal, round, and reactive to light.  Cardiovascular:     Rate and Rhythm: Normal rate and regular rhythm.     Pulses: Normal pulses.     Heart sounds: Normal heart sounds. No murmur heard. Pulmonary:     Effort: Tachypnea present. No respiratory distress.     Breath sounds: Examination of the right-middle field reveals decreased breath sounds. Examination of the right-lower field reveals decreased breath sounds.  Decreased breath sounds present. No rhonchi or rales.  Abdominal:     Palpations: Abdomen is soft.     Tenderness: There is no abdominal tenderness.  Musculoskeletal:     Cervical back: Normal range of motion and neck supple.     Right lower leg: No edema.     Left lower leg: No edema.  Skin:    General: Skin is warm and dry.     Capillary Refill: Capillary refill takes less than 2 seconds.  Neurological:     General: No focal deficit present.     Mental Status: He is alert.  Psychiatric:        Mood and Affect: Mood normal.    ED Results / Procedures / Treatments   Labs (all labs ordered are listed, but only abnormal results are displayed) Labs Reviewed  CBC WITH DIFFERENTIAL/PLATELET - Abnormal; Notable for the following components:      Result Value   RBC 4.18 (*)    Hemoglobin 12.0 (*)    HCT 36.2 (*)    Platelets 472 (*)    Monocytes Absolute 1.3 (*)    Eosinophils Absolute 0.6 (*)    Abs Immature Granulocytes 0.11 (*)    All other components within normal limits  COMPREHENSIVE METABOLIC PANEL - Abnormal; Notable for the following components:   CO2 19 (*)    Glucose, Bld 106 (*)    BUN 35 (*)    Creatinine, Ser 1.83 (*)    Calcium 8.8 (*)    Albumin 2.3 (*)    AST 43 (*)    GFR, Estimated 37 (*)    All other components within normal limits  RESP PANEL BY RT-PCR (FLU A&B, COVID) [id number]  BRAIN NATRIURETIC PEPTIDE  TROPONIN I (HIGH SENSITIVITY)  TROPONIN I (HIGH SENSITIVITY)    EKG EKG Interpretation  Date/Time:  Tuesday May 25 2021 09:22:32 EDT Ventricular Rate:  93 PR Interval:  160 QRS Duration: 98 QT Interval:  371 QTC Calculation: 462 R Axis:   71 Text Interpretation: Sinus rhythm Multiple premature complexes, vent & supraven Confirmed by Lennice Sites ([id number]) on 05/25/2021 9:34:59 AM  Radiology CT Angio Chest PE W and/or Wo Contrast  Result Date: 05/25/2021 CLINICAL DATA:  Shortness of breath EXAM: CT ANGIOGRAPHY CHEST WITH CONTRAST TECHNIQUE:  Multidetector CT imaging of the chest was performed using the standard protocol during bolus administration of intravenous contrast. Multiplanar CT image reconstructions and MIPs were obtained to evaluate the vascular anatomy. CONTRAST:  70mL OMNIPAQUE IOHEXOL 350 MG/ML SOLN COMPARISON:  None. FINDINGS: Cardiovascular: Examination for pulmonary embolism is limited by marginal contrast bolus, main pulmonary artery = 141 HU. Within this limitation, positive examination for pulmonary embolism, with segmental to subsegmental embolus present in the right upper lobe (series 4, image  37). Normal heart size. Three-vessel coronary artery calcifications. No pericardial effusion. Aortic atherosclerosis. Enlargement of the main pulmonary artery measuring up to 3.6 cm in caliber. RV LV ratio is preserved, approximately 0.6. Mediastinum/Nodes: Markedly enlarged subcarinal nodes measuring at least 6.1 x 2.6 cm (series 4, image 52) thyroid gland, trachea, and esophagus demonstrate no significant findings. Lungs/Pleura: Moderate right pleural effusion associated atelectasis or consolidation. There is a large mass centered in the right middle lobe, which occludes the middle lobe segmental bronchi, although difficult to distinguish from airspace consolidation and adjacent atelectasis, this measures approximately 7.9 x 7.7 cm (series 4, image 55). Upper Abdomen: No acute abnormality. Gallstones and or sludge in the dependent gallbladder. Musculoskeletal: No chest wall abnormality. No acute or significant osseous findings. Review of the MIP images confirms the above findings. IMPRESSION: 1. Positive examination for pulmonary embolism, with segmental to subsegmental embolus present in the right upper lobe. 2. Enlargement of the main pulmonary artery, concerning for pulmonary hypertension. No elevation of the RV LV ratio. 3. There is a large mass centered in the right middle lobe, which occludes the middle lobe segmental bronchi, although  difficult to distinguish from airspace consolidation and adjacent atelectasis, this measures approximately 7.9 x 7.7 cm. 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 4. Markedly enlarged subcarinal lymph nodes, concerning for nodal metastatic disease. 5. Moderate right pleural effusion and associated atelectasis or consolidation, presumably malignant, however without direct evidence of pleural metastatic disease. 6. Coronary artery disease. These results were called by telephone at the time of interpretation on 05/25/2021 at 11:29 am to Dr. Lennice Sites , who verbally acknowledged these results. Aortic Atherosclerosis (ICD10-I70.0). Electronically Signed   By: Eddie Candle M.D.   On: 05/25/2021 11:30   DG Chest Portable 1 View  Result Date: 05/25/2021 CLINICAL DATA:  Breath for 1 week, hypertension, RIGHT pleural effusion by abdominal ultrasound EXAM: PORTABLE CHEST 1 VIEW COMPARISON:  Portable exam 0920 hours without priors for comparison FINDINGS: Enlargement of cardiac silhouette with pulmonary vascular congestion. Atherosclerotic calcification aorta. RIGHT pleural effusion and basilar atelectasis versus consolidation. Minimal atelectasis at LEFT base. Upper lungs clear. No pneumothorax. Bones demineralized. IMPRESSION: Enlargement of cardiac silhouette with pulmonary vascular congestion. RIGHT pleural effusion with basilar atelectasis and/or consolidation. Minimal LEFT basilar atelectasis. Electronically Signed   By: Lavonia Dana M.D.   On: 05/25/2021 10:03    Procedures .Critical Care  Date/Time: 05/25/2021 11:35 AM Performed by: Lennice Sites, DO Authorized by: Lennice Sites, DO   Critical care provider statement:    Critical care time (minutes):  40   Critical care was necessary to treat or prevent imminent or life-threatening deterioration of the following conditions: pulmonary embolism.   Critical care was time spent personally by me on the following activities:  Blood draw for  specimens, development of treatment plan with patient or surrogate, discussions with primary provider, evaluation of patient's response to treatment, examination of patient, obtaining history from patient or surrogate, ordering and performing treatments and interventions, ordering and review of laboratory studies, ordering and review of radiographic studies, pulse oximetry, re-evaluation of patient's condition and review of old charts   Care discussed with: admitting provider     Medications Ordered in ED Medications  iohexol (OMNIPAQUE) 350 MG/ML injection 100 mL (80 mLs Intravenous Contrast Given 05/25/21 1048)    ED Course  I have reviewed the triage vital signs and the nursing notes.  Pertinent labs & imaging results that were available during my care of the patient  were reviewed by me and considered in my medical decision making (see chart for details).    MDM Rules/Calculators/A&P                           Kevin Hobbs is here for shortness of breath.  History of diabetes and hypertension.  Normal vitals.  No fever.  Shortness of breath of the past week.  Had negative home COVID test.  Currently treated for H. pylori.  Has been having some abdominal pain recently.  Had ultrasound yesterday that showed no cholecystitis but does show small pleural effusion.  No history of heart disease or heart failure.  No leg swelling.  Has had a cough but no sputum production.  Suspect possibly viral process but could have large effusion, PE, CHF.  Will check COVID.  Will evaluate for cardiac issues with troponin and BNP and chest x-ray.  Will check basic labs.  No abdominal tenderness on exam.  Oxygen stays just above 90 with ambulation and but does have increased work of breathing easily.  Chest x-ray showed effusion therefore CT scan was ordered.  Radiology called me on the phone and overall patient does have blood clot in the lung but also does have what likely is a lung mass with pleural effusion.   Patient with no other significant anemia, electrolyte [place], kidney injury.  Troponin and BNP within normal limits.  There is no right heart strain.  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  Now with blood clot as well.  We will start him on IV heparin and have been admitted for thoracentesis and further diagnostics.  This chart was dictated using voice recognition software.  Despite best efforts to proofread,  errors can occur which can change the documentation meaning.   Final Clinical Impression(s) / ED Diagnoses Final diagnoses:  Lung mass  Pleural effusion  Other acute pulmonary embolism, unspecified whether acute cor pulmonale present [facility])    Rx / DC Orders ED Discharge Orders     None        Lennice Sites, DO 05/25/21 1135

## 2021-05-25 NOTE — Plan of Care (Signed)
  Problem: Health Behavior/Discharge Planning: Goal: Ability to manage health-related needs will improve Outcome: Progressing   Problem: Education: Goal: Knowledge of General Education information will improve Description: Including pain rating scale, medication(s)/side effects and non-pharmacologic comfort measures Outcome: Progressing   Problem: Clinical Measurements: Goal: Will remain free from infection Outcome: Progressing   

## 2021-05-25 NOTE — ED Triage Notes (Signed)
Pt c/o shortness of breath x 1 week. Was at PCP this morning who told to come to ED. Wife states recently had an [place] which showed "fluid somewhere". Denies chest pain

## 2021-05-25 NOTE — Progress Notes (Signed)
ANTICOAGULATION CONSULT NOTE - Follow Up Consult  Pharmacy Consult for Heparin Indication: pulmonary embolus  No Known Allergies  Patient Measurements: Height: 5\' 7"  (170.2 cm) Weight: 118.8 kg (262 lb) IBW/kg (Calculated) : 66.1 Heparin Dosing Weight: 93.5 kg  Vital Signs: Temp: 97.5 F (36.4 C) (08/23 1609) Temp Source: Oral (08/23 1609) BP: 129/76 (08/23 1609) Pulse Rate: 88 (08/23 1609)  Labs: Recent Labs    05/25/21 0947 05/25/21 0948 05/25/21 1628  HGB 12.0*  --   --   HCT 36.2*  --   --   PLT 472*  --   --   APTT  --  31  --   LABPROT  --  16.0*  --   INR  --  1.3*  --   HEPARINUNFRC  --   --  0.19*  CREATININE 1.83*  --   --   TROPONINIHS 9  --  7    Estimated Creatinine Clearance: 41 mL/min (A) (by C-G formula based on SCr of 1.83 mg/dL (H)).   Medications:  Infusions:   heparin 1,600 Units/hr (05/25/21 1203)    Assessment: [name] with PMHx of DM and HTN. Arrives to ED with SOB for past week, with increased WOB. CXR with effusion, with CTA chest which showed PE with segmental to subsegmental bolus in RUL. No elevation of RV:LV. Also found to have large mass in R middle lobe.  SCr elevated at 1.8, CrCl ~ 41 ml/min CBC: Hgb low at 12, Plt elevated 472 16:28 Heparin level obtained ~3.5 hours early 20:00 Heparin level 0.41, therapeutic on heparin at 1600 units/hr No bleeding or complications reported.   Goal of Therapy:  Heparin level 0.3-0.7 units/ml Monitor platelets by anticoagulation protocol: Yes   Plan:  Continue heparin IV infusion at 1600 units/hr Confirmatory heparin level in 8 hours Daily heparin level and CBC   Gretta Arab PharmD, BCPS Clinical Pharmacist [facility] main pharmacy [phone] 05/25/2021 7:27 PM

## 2021-05-25 NOTE — Care Plan (Addendum)
80 year old male with history of diabetes, hypertension former smoker presents with increasing shortness of breath and dry cough over the last week.  COVID-negative, CTA obtained showing pleural effusion on the right new lung mass 7 x 7 right-sided PE.  Patient was started on heparin drip.  Patient will be transferred from [facility] ED to [facility] for further evaluation and admission for likely new lung cancer and PE etc.  Patient discussed with Dr. Lennice Sites.  Consult pulm on arrival, pt discussed w/ Mr. [facility].  Pt not hypoxic on room air.

## 2021-05-25 NOTE — H&P (Addendum)
TRH H&P    Patient Demographics:    Kevin Hobbs, is a 80 y.o. male  MRN: [id number]  DOB - 12/16/1940  Admit Date - 05/25/2021  Referring MD/NP/PA: Dr. Ronnald Nian  Outpatient Primary MD for the patient is Robyne Peers, MD  Patient coming from: [facility]  Chief complaint-shortness of breath   HPI:    Kevin Hobbs  is a 80 y.o. male, with history of diabetes mellitus type 2, hypertension, former smoker who quit smoking in 1996, presented with shortness of breath and dry cough for past 1 week.  Patient states that he had abdominal pain, and was seen by PCP.  Had abdominal ultrasound yesterday which showed no cholecystitis but did show small pleural effusion.  He went for blood work at PCP office and developed extreme shortness of breath so was sent to ED for further evaluation. In the ED CTA chest showed pulmonary embolism and right side pleural effusion with right middle lobe mass measuring 7.9 x 7.7 cm.  Also showed markedly enlarged subcarinal lymph nodes concerning for nodal metastatic disease.  Moderate right pleural effusion with associated atelectasis. Patient denies nausea vomiting or diarrhea Complains of cough, no fever or chills. Denies abdominal pain or dysuria    Review of systems:    In addition to the HPI above,    All other systems reviewed and are negative.    Past History of the following :    Past Medical History:  Diagnosis Date   Diabetes mellitus without complication ([facility])    Hypertension       Past Surgical History:  Procedure Laterality Date   HERNIA REPAIR        Social History:      Social History   Tobacco Use   Smoking status: Former    Types: Cigarettes   Smokeless tobacco: Never  Substance Use Topics   Alcohol use: Not Currently       Family History :   No family history of cancer    Home Medications:   Prior to Admission  medications   Medication Sig Start Date End Date Taking? Authorizing Provider  amLODipine (NORVASC) 10 MG tablet Take 10 mg by mouth daily. 05/04/21  Yes [provider]  amoxicillin (AMOXIL) 500 MG tablet Take 1,000 mg by mouth 2 (two) times daily. 05/24/21  Yes [provider]  clarithromycin (BIAXIN) 500 MG tablet Take 500 mg by mouth 2 (two) times daily. Start date : 05/24/21   Yes [provider]  lansoprazole (PREVACID) 30 MG capsule Take 30 mg by mouth 2 (two) times daily before a meal. 05/24/21  Yes [provider]  losartan (COZAAR) 100 MG tablet Take 100 mg by mouth daily. 03/29/21  Yes [provider]  metFORMIN (GLUCOPHAGE-XR) 500 MG 24 hr tablet Take 1,000 mg by mouth daily. 02/28/21  Yes [provider]  pantoprazole (PROTONIX) 40 MG tablet Take 40 mg by mouth daily. 05/04/21  Yes [provider]  simvastatin (ZOCOR) 10 MG tablet Take 10 mg by mouth daily.  05/04/21  Yes [provider]  sodium chloride (OCEAN) 0.65 % nasal spray Place 1 spray into the nose at bedtime.   Yes [provider]     Allergies:    No Known Allergies   Physical Exam:   Vitals  Blood pressure 129/76, pulse 88, temperature (!) 97.5 F (36.4 C), temperature source Oral, resp. rate (!) 26, height 5\' 7"  (1.702 m), weight 118.8 kg, SpO2 93 %.  1.  General: Appears in no acute distress  2. Psychiatric: Alert, oriented x3, intact insight and judgment  3. Neurologic: Cranial nerves II through XII grossly intact, no focal deficit noted  4. HEENMT:  Atraumatic normocephalic, extraocular muscles are intact  5. Respiratory : Bilateral wheezing auscultated  6. Cardiovascular : S1-S2, regular, no murmur auscultated  7. Gastrointestinal:  Abdomen is soft, nontender, no organomegaly  8. Skin:  No rashes noted   Data Review:    CBC Recent Labs  Lab 05/25/21 0947  WBC 10.5  HGB 12.0*  HCT 36.2*  PLT 472*  MCV 86.6   MCH 28.7  MCHC 33.1  RDW 14.6  LYMPHSABS 1.2  MONOABS 1.3*  EOSABS 0.6*  BASOSABS 0.1   ------------------------------------------------------------------------------------------------------------------  Results for orders placed or performed during the hospital encounter of 05/25/21 (from the past 48 hour(s))  Resp Panel by RT-PCR (Flu A&B, Covid) Nasopharyngeal Swab     Status: None   Collection Time: 05/25/21  9:47 AM   Specimen: Nasopharyngeal Swab; Nasopharyngeal(NP) swabs in vial transport medium  Result Value Ref Range   SARS Coronavirus 2 by RT PCR NEGATIVE NEGATIVE    Comment: (NOTE) SARS-CoV-2 target nucleic acids are NOT DETECTED.  The SARS-CoV-2 RNA is generally detectable in upper respiratory specimens during the acute phase of infection. The lowest concentration of SARS-CoV-2 viral copies this assay can detect is 138 copies/mL. A negative result does not preclude SARS-Cov-2 infection and should not be used as the sole basis for treatment or other patient management decisions. A negative result may occur with  improper specimen collection/handling, submission of specimen other than nasopharyngeal swab, presence of viral mutation(s) within the areas targeted by this assay, and inadequate number of viral copies(<138 copies/mL). A negative result must be combined with clinical observations, patient history, and epidemiological information. The expected result is Negative.  Fact Sheet for Patients:  EntrepreneurPulse.com.au  Fact Sheet for Healthcare Providers:  IncredibleEmployment.be  This test is no t yet approved or cleared by the [place] FDA and  has been authorized for detection and/or diagnosis of SARS-CoV-2 by FDA under an Emergency Use Authorization (EUA). This EUA will remain  in effect (meaning this test can be used) for the duration of the COVID-19 declaration under Section 564(b)(1) of the Act,  21 U.S.C.section 360bbb-3(b)(1), unless the authorization is terminated  or revoked sooner.       Influenza A by PCR NEGATIVE NEGATIVE   Influenza B by PCR NEGATIVE NEGATIVE    Comment: (NOTE) The Xpert Xpress SARS-CoV-2/FLU/RSV plus assay is intended as an aid in the diagnosis of influenza from Nasopharyngeal swab specimens and should not be used as a sole basis for treatment. Nasal washings and aspirates are unacceptable for Xpert Xpress SARS-CoV-2/FLU/RSV testing.  Fact Sheet for Patients: EntrepreneurPulse.com.au  Fact Sheet for Healthcare Providers: IncredibleEmployment.be  This test is not yet approved or cleared by the [place] FDA and has been authorized for detection and/or diagnosis of SARS-CoV-2 by FDA under an Emergency Use Authorization (EUA). This EUA will remain in  effect (meaning this test can be used) for the duration of the COVID-19 declaration under Section 564(b)(1) of the Act, 21 U.S.C. section 360bbb-3(b)(1), unless the authorization is terminated or revoked.  Performed at [facility], [address] [place], [place] [address]   CBC with Differential     Status: Abnormal   Collection Time: 05/25/21  9:47 AM  Result Value Ref Range   WBC 10.5 4.0 - 10.5 K/uL   RBC 4.18 (L) 4.22 - 5.81 MIL/uL   Hemoglobin 12.0 (L) 13.0 - 17.0 g/dL   HCT 36.2 (L) 39.0 - 52.0 %   MCV 86.6 80.0 - 100.0 fL   MCH 28.7 26.0 - 34.0 pg   MCHC 33.1 30.0 - 36.0 g/dL   RDW 14.6 11.5 - 15.5 %   Platelets 472 (H) 150 - 400 K/uL   nRBC 0.0 0.0 - 0.2 %   Neutrophils Relative % 69 %   Neutro Abs 7.3 1.7 - 7.7 K/uL   Lymphocytes Relative 11 %   Lymphs Abs 1.2 0.7 - 4.0 K/uL   Monocytes Relative 12 %   Monocytes Absolute 1.3 (H) 0.1 - 1.0 K/uL   Eosinophils Relative 6 %   Eosinophils Absolute 0.6 (H) 0.0 - 0.5 K/uL   Basophils Relative 1 %   Basophils Absolute 0.1 0.0 - 0.1 K/uL   Immature Granulocytes 1 %   Abs Immature  Granulocytes 0.11 (H) 0.00 - 0.07 K/uL    Comment: Performed at [facility], [address] [place], [place] [address]  Comprehensive metabolic panel     Status: Abnormal   Collection Time: 05/25/21  9:47 AM  Result Value Ref Range   Sodium 137 135 - 145 mmol/L   Potassium 4.5 3.5 - 5.1 mmol/L   Chloride 105 98 - 111 mmol/L   CO2 19 (L) 22 - 32 mmol/L   Glucose, Bld 106 (H) 70 - 99 mg/dL    Comment: Glucose reference range applies only to samples taken after fasting for at least 8 hours.   BUN 35 (H) 8 - 23 mg/dL   Creatinine, Ser 1.83 (H) 0.61 - 1.24 mg/dL   Calcium 8.8 (L) 8.9 - 10.3 mg/dL   Total Protein 6.8 6.5 - 8.1 g/dL   Albumin 2.3 (L) 3.5 - 5.0 g/dL   AST 43 (H) 15 - 41 U/L   ALT 27 0 - 44 U/L   Alkaline Phosphatase 120 38 - 126 U/L   Total Bilirubin 1.1 0.3 - 1.2 mg/dL   GFR, Estimated 37 (L) >60 mL/min    Comment: (NOTE) Calculated using the CKD-EPI Creatinine Equation (2021)    Anion gap 13 5 - 15    Comment: Performed at [facility], [address] [place], [place] [address]  Brain natriuretic peptide     Status: None   Collection Time: 05/25/21  9:47 AM  Result Value Ref Range   B Natriuretic Peptide 10.9 0.0 - 100.0 pg/mL    Comment: Performed at [facility], [address] [place], [place] [address]  Troponin I (High Sensitivity)     Status: None   Collection Time: 05/25/21  9:47 AM  Result Value Ref Range   Troponin I (High Sensitivity) 9 <18 ng/L    Comment: (NOTE) Elevated high sensitivity troponin I (hsTnI) values and significant  changes across serial measurements may suggest ACS but many other  chronic and acute conditions are known to elevate hsTnI  results.  Refer to the "Links" section for chest pain algorithms and additional  guidance. Performed at [facility], [address] [place], [place] [address]   APTT     Status: None   Collection Time: 05/25/21  9:48 AM  Result Value Ref Range   aPTT  31 24 - 36 seconds    Comment: Performed at [facility], [address] [place], [place] [address]  Protime-INR     Status: Abnormal   Collection Time: 05/25/21  9:48 AM  Result Value Ref Range   Prothrombin Time 16.0 (H) 11.4 - 15.2 seconds   INR 1.3 (H) 0.8 - 1.2    Comment: (NOTE) INR goal varies based on device and disease states. Performed at [facility], [address] [name], [place] [address]     Chemistries  Recent Labs  Lab 05/25/21 [id number]  NA 137  K 4.5  CL 105  CO2 19*  GLUCOSE 106*  BUN 35*  CREATININE 1.83*  CALCIUM 8.8*  AST 43*  ALT 27  ALKPHOS 120  BILITOT 1.1   ------------------------------------------------------------------------------------------------------------------  ------------------------------------------------------------------------------------------------------------------ GFR: Estimated Creatinine Clearance: 41 mL/min (A) (by C-G formula based on SCr of 1.83 mg/dL (H)). Liver Function Tests: Recent Labs  Lab 05/25/21 0947  AST 43*  ALT 27  ALKPHOS 120  BILITOT 1.1  PROT 6.8  ALBUMIN 2.3*   No results for input(s): LIPASE, AMYLASE in the last 168 hours. No results for input(s): AMMONIA in the last 168 hours. Coagulation Profile: Recent Labs  Lab 05/25/21 0948  INR 1.3*   Cardiac Enzymes: No results for input(s): CKTOTAL, CKMB, CKMBINDEX, TROPONINI in the last 168 hours. BNP (last 3 results) No results for input(s): PROBNP in the last 8760 hours. HbA1C: No results for input(s): HGBA1C in the last 72 hours. CBG: No results for input(s): GLUCAP in the last 168 hours. Lipid Profile: No results for input(s): CHOL, HDL, LDLCALC, TRIG, CHOLHDL, LDLDIRECT in the last 72 hours. Thyroid Function Tests: No results for input(s): TSH, T4TOTAL, FREET4, T3FREE, THYROIDAB in the last 72 hours. Anemia Panel: No results for input(s): VITAMINB12, FOLATE, FERRITIN, TIBC, IRON, RETICCTPCT in the last 72  hours.  --------------------------------------------------------------------------------------------------------------- Urine analysis: No results found for: COLORURINE, APPEARANCEUR, LABSPEC, PHURINE, GLUCOSEU, HGBUR, BILIRUBINUR, KETONESUR, PROTEINUR, UROBILINOGEN, NITRITE, LEUKOCYTESUR    Imaging Results:    CT Angio Chest PE W and/or Wo Contrast  Result Date: 05/25/2021 CLINICAL DATA:  Shortness of breath EXAM: CT ANGIOGRAPHY CHEST WITH CONTRAST TECHNIQUE: Multidetector CT imaging of the chest was performed using the standard protocol during bolus administration of intravenous contrast. Multiplanar CT image reconstructions and MIPs were obtained to evaluate the vascular anatomy. CONTRAST:  37mL OMNIPAQUE IOHEXOL 350 MG/ML SOLN COMPARISON:  None. FINDINGS: Cardiovascular: Examination for pulmonary embolism is limited by marginal contrast bolus, main pulmonary artery = 141 HU. Within this limitation, positive examination for pulmonary embolism, with segmental to subsegmental embolus present in the right upper lobe (series 4, image 37). Normal heart size. Three-vessel coronary artery calcifications. No pericardial effusion. Aortic atherosclerosis. Enlargement of the main pulmonary artery measuring up to 3.6 cm in caliber. RV LV ratio is preserved, approximately 0.6. Mediastinum/Nodes: Markedly enlarged subcarinal nodes measuring at least 6.1 x 2.6 cm (series 4, image 52) thyroid gland, trachea, and esophagus demonstrate no significant findings. Lungs/Pleura: Moderate right pleural effusion associated atelectasis or consolidation. There is a large mass centered in the right middle lobe, which occludes the middle lobe segmental  bronchi, although difficult to distinguish from airspace consolidation and adjacent atelectasis, this measures approximately 7.9 x 7.7 cm (series 4, image 55). Upper Abdomen: No acute abnormality. Gallstones and or sludge in the dependent gallbladder. Musculoskeletal: No chest wall  abnormality. No acute or significant osseous findings. Review of the MIP images confirms the above findings. IMPRESSION: 1. Positive examination for pulmonary embolism, with segmental to subsegmental embolus present in the right upper lobe. 2. Enlargement of the main pulmonary artery, concerning for pulmonary hypertension. No elevation of the RV LV ratio. 3. There is a large mass centered in the right middle lobe, which occludes the middle lobe segmental bronchi, although difficult to distinguish from airspace consolidation and adjacent atelectasis, this measures approximately 7.9 x 7.7 cm. 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 4. Markedly enlarged subcarinal lymph nodes, concerning for nodal metastatic disease. 5. Moderate right pleural effusion and associated atelectasis or consolidation, presumably malignant, however without direct evidence of pleural metastatic disease. 6. Coronary artery disease. These results were called by telephone at the time of interpretation on 05/25/2021 at 11:29 am to Dr. Lennice Sites , who verbally acknowledged these results. Aortic Atherosclerosis (ICD10-I70.0). Electronically Signed   By: Eddie Candle M.D.   On: 05/25/2021 11:30   DG Chest Portable 1 View  Result Date: 05/25/2021 CLINICAL DATA:  Breath for 1 week, hypertension, RIGHT pleural effusion by abdominal ultrasound EXAM: PORTABLE CHEST 1 VIEW COMPARISON:  Portable exam 0920 hours without priors for comparison FINDINGS: Enlargement of cardiac silhouette with pulmonary vascular congestion. Atherosclerotic calcification aorta. RIGHT pleural effusion and basilar atelectasis versus consolidation. Minimal atelectasis at LEFT base. Upper lungs clear. No pneumothorax. Bones demineralized. IMPRESSION: Enlargement of cardiac silhouette with pulmonary vascular congestion. RIGHT pleural effusion with basilar atelectasis and/or consolidation. Minimal LEFT basilar atelectasis. Electronically Signed   By: Lavonia Dana M.D.   On: 05/25/2021 10:03     EKG -Shows multiple PACs.  Normal sinus rhythm.  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  Pulmonology has been consulted, he will likely need diagnostic and therapeutic thoracentesis.  Further management as per recommendations by pulmonology. Pulmonary embolism-CTA chest shows segmental to subsegmental embolus in the right upper lobe.  Patient started on IV heparin per pharmacy.  Currently not requiring oxygen.  No elevation of RV/LV ratio.  Enlargement of main pulmonary artery concerning for pulmonary hypertension.  Will obtain echocardiogram.  Follow results. Moderate right pleural effusion-Will need diagnostic and therapeutic thoracentesis.  Pulmonology has been consulted. Hypertension-blood pressure is soft, will hold antihypertensive medications at this time. Diabetes mellitus type 2-hold metformin, will initiate sliding scale insulin with NovoLog. GERD-continue Protonix 40 mg p.o. daily   DVT Prophylaxis-   full dose heparin  AM Labs Ordered, also please review Full Orders  Family Communication: Admission, patients condition and plan of care including tests being ordered have been discussed with the patient and his wife at bedside who indicate understanding and agree with the plan and Code Status.  Code Status: Full code  Admission status: Inpatient :The appropriate admission status for this patient is INPATIENT. Inpatient status is judged to be reasonable and necessary in order to provide the required intensity of service to ensure the patient's safety. The patient's presenting symptoms, physical exam findings, and initial radiographic and laboratory data in the context of their chronic comorbidities is felt to place them at high  risk for further clinical deterioration. Furthermore, it is not anticipated that the  patient will be medically stable for discharge from the hospital within 2 midnights of admission. The following factors support the admission status of inpatient.     The patient's presenting symptoms include shortness of breath The worrisome physical exam findings include lung mass on CT chest. The initial radiographic and laboratory data are worrisome because of lung mass, pulmonary embolism.        * I certify that at the point of admission it is my clinical judgment that the patient will require inpatient hospital care spanning beyond 2 midnights from the point of admission due to high intensity of service, high risk for further deterioration and high frequency of surveillance required.*  Time spent in minutes : 60 minutes   Carolle Ishii S Jera Headings M.D

## 2021-05-25 NOTE — Consult Note (Signed)
NAME:  Kevin Hobbs, MRN:  [id number], DOB:  01-20-1941, LOS: 0 ADMISSION DATE:  05/25/2021, CONSULTATION DATE:  05/25/21 REFERRING MD:  Dwyane Dee CHIEF COMPLAINT:  Lung Mass, PE   History of Present Illness:  Kevin Hobbs is a 80 y.o. male who presented to [facility] 8/23 with complaints 1 month and then right-sided pleuritic chest pain x1 or 2 weeks followed by insidious onset and progressive dyspnea x 1 week.  As the dyspnea got worse chest pain got better.  Saw PCP that morning and was told to go to ED. In ED, he was hypoxic with exertion but stable at rest.  COVID negative. CXR showed left effusion.  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 He was admitted by [facility] and PCCM asked to see in consultation.  The CT scan was personally visualized by PCCM heparin infusion.  At admission biomarkers for severity of pulm embolism suggested to mild pulm embolism based on the fact on CT scan there is no RV LV strain [all the pulmonary arteries are enlarged suggesting hypertension is present], BNP is normal, troponin is normal.  He is on room air.  On basis of age, male gender and history of cancer even though his hemodynamics are normal we have CLass 4 PESI score of 118 points.  [place]  He quit smoking in 1996 also.  Wife also used to smoke.  She also quit within a year of him quitting.  Pertinent  Medical History:  has Pulmonary embolism ([place]) on their problem list.   has a past medical history of Diabetes mellitus without complication ([place]) and Hypertension.   reports that he has quit smoking. His smoking use included cigarettes. He has never used smokeless tobacco.  Past Surgical History:  Procedure Laterality Date   HERNIA REPAIR      No Known Allergies   There is no immunization history on file for this  patient.  History reviewed. No pertinent family history.   Current Facility-Administered Medications:    heparin ADULT infusion 100 units/mL (25000 units/238mL), 1,600 Units/hr, Intravenous, Continuous, Curatolo, Adam, DO, Last Rate: 16 mL/hr at 05/25/21 1203, 1,600 Units/hr at 05/25/21 [place] [facility] Events: Including procedures, antibiotic start and stop dates in addition to other pertinent events   8/23 admit  Interim History / Subjective:  x  Objective:  Blood pressure 119/68, pulse 80, temperature 97.9 F (36.6 C), temperature source Oral, resp. rate (!) 21, height 5\' 7"  (1.702 m), weight 118.8 kg, SpO2 93 %.       No intake or output data in the 24 hours ending 05/25/21 1212 Filed Weights   05/25/21 0919  Weight: 118.8 kg    Examination: General: Pleasant male.  Lying in the bed.  IV heparin on.  No distress.  Family at the bedside Neuro: Alert and oriented x3.  Speech is normal.  Moves all 4 HEENT: No elevated JVP no neck nodes Cardiovascular: Regular rate and rhythm no murmurs Lungs: Clear to auscultation bilaterally.  Diminished air entry on the right side Abdomen: Obese and protuberant.  No tenderness Musculoskeletal: No cyanosis no clubbing no edema Skin: Intact GU: Not examined  Labs/imaging personally reviewed:  CTA chest 8/23 > RUL PE with normal RV/LV ratio and incidentally showed large RML mass 7.9cm x 7.7cm concerning for primary lung CA,  marked enlarged subcarinal lymph nodes, moderate right pleural effusion.  Assessment & Plan:   RUL PE Present on admission- likely provoked in setting of probable undiagnosed lung CA. class IV pulmonary embolism severity index score 118 point.  Biomarkers for submassive PE are negative  Plan - Continue heparin gtt for at least 48-72 hours due to risk of decompensation-> then start oral anticoagulation with priority for DOAC if no contraindication - No role in lytics at this point - Assess LE duplex,  echo. - Trend trops. -Check lactic acid  Large RML mass with enlarged subcarinal lymph nodes and mod R effusion.  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  Plan  - Aim for ultrasound-guided thoracentesis but only after ensuring 24 hours of stability postadmission in terms of pulmonary embolism with IV heparin  -Sent for cytology and also do therapeutic thoracentesis.  Heparin to be held at least for couple of hours before and after thora.  Risk of bleeding, pneumothorax, reexpansion pulmonary edema and nondiagnostic all explained. Marland Kitchen  He and family are willing to proceed  -Outpatient PET scan  -Further biopsy based on PET scan results including endobronchial ultrasound  Bilateral wheezing at admission   - Start DuoNeb    Best practice (evaluated daily):  Per primary team.     SIGNATURE    Dr. Brand Males, M.D., F.C.C.P,  Pulmonary and Critical Care Medicine Staff Physician, [place] Director - Interstitial Lung Disease  Program  Pulmonary [place] at [place], [place], [address]  NPI Number:  NPI #[id number]  Pager: [phone], If no answer  -> Check AMION or Try [phone] Telephone (clinical office): [phone] Telephone (research): [phone]  5:37 PM 05/25/2021    LABS    PULMONARY No results for input(s): PHART, PCO2ART, PO2ART, HCO3, TCO2, O2SAT in the last 168 hours.  Invalid input(s): PCO2, PO2  CBC Recent Labs  Lab 05/25/21 0947  HGB 12.0*  HCT 36.2*  WBC 10.5  PLT 472*    COAGULATION Recent Labs  Lab 05/25/21 0948  INR 1.3*    CARDIAC  No results for input(s): TROPONINI in the last 168 hours. No results for input(s): PROBNP in the last 168 hours.   CHEMISTRY Recent Labs  Lab 05/25/21 0947  NA 137  K 4.5  CL 105  CO2 19*  GLUCOSE 106*  BUN 35*  CREATININE 1.83*  CALCIUM 8.8*   Estimated Creatinine Clearance: 41 mL/min (A)  (by C-G formula based on SCr of 1.83 mg/dL (H)).   LIVER Recent Labs  Lab 05/25/21 0947 05/25/21 0948  AST 43*  --   ALT 27  --   ALKPHOS 120  --   BILITOT 1.1  --   PROT 6.8  --   ALBUMIN 2.3*  --   INR  --  1.3*     INFECTIOUS No results for input(s): LATICACIDVEN, PROCALCITON in the last 168 hours.   ENDOCRINE CBG (last 3)  No results for input(s): GLUCAP in the last 72 hours.       IMAGING x48h  - image(s) personally visualized  -   highlighted in bold CT Angio Chest PE W and/or Wo Contrast  Result Date: 05/25/2021 CLINICAL DATA:  Shortness of breath EXAM: CT ANGIOGRAPHY CHEST WITH CONTRAST TECHNIQUE: Multidetector CT imaging of the chest was performed using the standard protocol during bolus administration of intravenous contrast. Multiplanar CT image reconstructions and MIPs were obtained to  evaluate the vascular anatomy. CONTRAST:  80mL OMNIPAQUE IOHEXOL 350 MG/ML SOLN COMPARISON:  None. FINDINGS: Cardiovascular: Examination for pulmonary embolism is limited by marginal contrast bolus, main pulmonary artery = 141 HU. Within this limitation, positive examination for pulmonary embolism, with segmental to subsegmental embolus present in the right upper lobe (series 4, image 37). Normal heart size. Three-vessel coronary artery calcifications. No pericardial effusion. Aortic atherosclerosis. Enlargement of the main pulmonary artery measuring up to 3.6 cm in caliber. RV LV ratio is preserved, approximately 0.6. Mediastinum/Nodes: Markedly enlarged subcarinal nodes measuring at least 6.1 x 2.6 cm (series 4, image 52) thyroid gland, trachea, and esophagus demonstrate no significant findings. Lungs/Pleura: Moderate right pleural effusion associated atelectasis or consolidation. There is a large mass centered in the right middle lobe, which occludes the middle lobe segmental bronchi, although difficult to distinguish from airspace consolidation and adjacent atelectasis, this measures  approximately 7.9 x 7.7 cm (series 4, image 55). Upper Abdomen: No acute abnormality. Gallstones and or sludge in the dependent gallbladder. Musculoskeletal: No chest wall abnormality. No acute or significant osseous findings. Review of the MIP images confirms the above findings. IMPRESSION: 1. Positive examination for pulmonary embolism, with segmental to subsegmental embolus present in the right upper lobe. 2. Enlargement of the main pulmonary artery, concerning for pulmonary hypertension. No elevation of the RV LV ratio. 3. There is a large mass centered in the right middle lobe, which occludes the middle lobe segmental bronchi, although difficult to distinguish from airspace consolidation and adjacent atelectasis, this measures approximately 7.9 x 7.7 cm. 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 4. Markedly enlarged subcarinal lymph nodes, concerning for nodal metastatic disease. 5. Moderate right pleural effusion and associated atelectasis or consolidation, presumably malignant, however without direct evidence of pleural metastatic disease. 6. Coronary artery disease. These results were called by telephone at the time of interpretation on 05/25/2021 at 11:29 am to Dr. Lennice Sites , who verbally acknowledged these results. Aortic Atherosclerosis (ICD10-I70.0). Electronically Signed   By: Eddie Candle M.D.   On: 05/25/2021 11:30   DG Chest Portable 1 View  Result Date: 05/25/2021 CLINICAL DATA:  Breath for 1 week, hypertension, RIGHT pleural effusion by abdominal ultrasound EXAM: PORTABLE CHEST 1 VIEW COMPARISON:  Portable exam 0920 hours without priors for comparison FINDINGS: Enlargement of cardiac silhouette with pulmonary vascular congestion. Atherosclerotic calcification aorta. RIGHT pleural effusion and basilar atelectasis versus consolidation. Minimal atelectasis at LEFT base. Upper lungs clear. No pneumothorax. Bones demineralized. IMPRESSION: Enlargement of cardiac silhouette with  pulmonary vascular congestion. RIGHT pleural effusion with basilar atelectasis and/or consolidation. Minimal LEFT basilar atelectasis. Electronically Signed   By: Lavonia Dana M.D.   On: 05/25/2021 10:03

## 2021-05-25 NOTE — ED Notes (Signed)
Wife in room with pt

## 2021-05-25 NOTE — Progress Notes (Signed)
Patient got very SOB with exertion, SAT around 89-90%. After settling in bed, SAT 93%. Will place O2 if needed

## 2021-05-26 ENCOUNTER — Other Ambulatory Visit: Payer: Self-pay

## 2021-05-26 ENCOUNTER — Inpatient Hospital Stay (HOSPITAL_COMMUNITY): Payer: PPO

## 2021-05-26 DIAGNOSIS — J9601 Acute respiratory failure with hypoxia: Secondary | ICD-10-CM

## 2021-05-26 DIAGNOSIS — I2693 Single subsegmental pulmonary embolism without acute cor pulmonale: Secondary | ICD-10-CM | POA: Diagnosis not present

## 2021-05-26 DIAGNOSIS — I2694 Multiple subsegmental pulmonary emboli without acute cor pulmonale: Secondary | ICD-10-CM

## 2021-05-26 LAB — COMPREHENSIVE METABOLIC PANEL
ALT: 28 U/L (ref 0–44)
AST: 62 U/L — ABNORMAL HIGH (ref 15–41)
Albumin: 2.5 g/dL — ABNORMAL LOW (ref 3.5–5.0)
Alkaline Phosphatase: 125 U/L (ref 38–126)
Anion gap: 12 (ref 5–15)
BUN: 31 mg/dL — ABNORMAL HIGH (ref 8–23)
CO2: 18 mmol/L — ABNORMAL LOW (ref 22–32)
Calcium: 8.9 mg/dL (ref 8.9–10.3)
Chloride: 111 mmol/L (ref 98–111)
Creatinine, Ser: 1.48 mg/dL — ABNORMAL HIGH (ref 0.61–1.24)
GFR, Estimated: 48 mL/min — ABNORMAL LOW (ref >60)
Glucose, Bld: 105 mg/dL — ABNORMAL HIGH (ref 70–99)
Potassium: 4.2 mmol/L (ref 3.5–5.1)
Sodium: 141 mmol/L (ref 135–145)
Total Bilirubin: 1 mg/dL (ref 0.3–1.2)
Total Protein: 7.1 g/dL (ref 6.5–8.1)

## 2021-05-26 LAB — ECHOCARDIOGRAM COMPLETE
Area-P 1/2: 3.33 cm2
Height: 67 in
S' Lateral: 3.2 cm
Single Plane A4C EF: 57.5 %
Weight: 4192 oz

## 2021-05-26 LAB — GLUCOSE, PLEURAL OR PERITONEAL FLUID: Glucose, Fluid: 89 mg/dL

## 2021-05-26 LAB — PROTEIN, PLEURAL OR PERITONEAL FLUID: Total protein, fluid: 5.1 g/dL

## 2021-05-26 LAB — HEPARIN LEVEL (UNFRACTIONATED)
Heparin Unfractionated: 0.13 IU/mL — ABNORMAL LOW (ref 0.30–0.70)
Heparin Unfractionated: <0.1 [IU]/mL — ABNORMAL LOW (ref 0.30–0.70)

## 2021-05-26 LAB — CBC
HCT: 37.1 % — ABNORMAL LOW (ref 39.0–52.0)
Hemoglobin: 11.9 g/dL — ABNORMAL LOW (ref 13.0–17.0)
MCH: 28.3 pg (ref 26.0–34.0)
MCHC: 32.1 g/dL (ref 30.0–36.0)
MCV: 88.1 fL (ref 80.0–100.0)
Platelets: 488 K/uL — ABNORMAL HIGH (ref 150–400)
RBC: 4.21 MIL/uL — ABNORMAL LOW (ref 4.22–5.81)
RDW: 14.8 % (ref 11.5–15.5)
WBC: 9.4 K/uL (ref 4.0–10.5)
nRBC: 0 % (ref 0.0–0.2)

## 2021-05-26 LAB — ALBUMIN, PLEURAL OR PERITONEAL FLUID: Albumin, Fluid: 2.2 g/dL

## 2021-05-26 LAB — LACTATE DEHYDROGENASE, PLEURAL OR PERITONEAL FLUID: LD, Fluid: 614 U/L — ABNORMAL HIGH (ref 3–23)

## 2021-05-26 LAB — BODY FLUID CELL COUNT WITH DIFFERENTIAL
Eos, Fluid: 2 %
Lymphs, Fluid: 20 %
Monocyte-Macrophage-Serous Fluid: 21 % — ABNORMAL LOW (ref 50–90)
Neutrophil Count, Fluid: 57 % — ABNORMAL HIGH (ref 0–25)
Total Nucleated Cell Count, Fluid: UNDETERMINED cu mm (ref 0–1000)

## 2021-05-26 LAB — GLUCOSE, CAPILLARY
Glucose-Capillary: 108 mg/dL — ABNORMAL HIGH (ref 70–99)
Glucose-Capillary: 104 mg/dL — ABNORMAL HIGH (ref 70–99)
Glucose-Capillary: 138 mg/dL — ABNORMAL HIGH (ref 70–99)

## 2021-05-26 LAB — LACTIC ACID, PLASMA: Lactic Acid, Venous: 1.1 mmol/L (ref 0.5–1.9)

## 2021-05-26 LAB — TROPONIN I (HIGH SENSITIVITY): Troponin I (High Sensitivity): 11 ng/L (ref <18)

## 2021-05-26 MED ORDER — FUROSEMIDE 10 MG/ML IJ SOLN
40.0000 mg | Freq: Once | INTRAMUSCULAR | Status: AC
  Administered 2021-05-26: 40 mg via INTRAVENOUS
  Filled 2021-05-26: qty 4

## 2021-05-26 MED ORDER — LIDOCAINE HCL 1 % IJ SOLN
INTRAMUSCULAR | Status: AC
  Filled 2021-05-26: qty 20

## 2021-05-26 MED ORDER — HEPARIN (PORCINE) 25000 UT/250ML-% IV SOLN
2200.0000 [IU]/h | INTRAVENOUS | Status: DC
  Administered 2021-05-26: 2000 [IU]/h via INTRAVENOUS
  Administered 2021-05-27: 2200 [IU]/h via INTRAVENOUS
  Filled 2021-05-26 (×2): qty 250

## 2021-05-26 MED ORDER — CHLORHEXIDINE GLUCONATE 0.12 % MT SOLN
15.0000 mL | Freq: Two times a day (BID) | OROMUCOSAL | Status: DC
  Administered 2021-05-26 – 2021-05-27 (×3): 15 mL via OROMUCOSAL
  Filled 2021-05-26 (×3): qty 15

## 2021-05-26 MED ORDER — TRAMADOL HCL 50 MG PO TABS
50.0000 mg | ORAL_TABLET | Freq: Four times a day (QID) | ORAL | Status: AC | PRN
  Administered 2021-05-26 – 2021-05-27 (×2): 50 mg via ORAL
  Filled 2021-05-26 (×2): qty 1

## 2021-05-26 MED ORDER — HEPARIN BOLUS VIA INFUSION
3000.0000 [IU] | Freq: Once | INTRAVENOUS | Status: AC
  Administered 2021-05-26: 3000 [IU] via INTRAVENOUS
  Filled 2021-05-26: qty 3000

## 2021-05-26 MED ORDER — ORAL CARE MOUTH RINSE
15.0000 mL | Freq: Two times a day (BID) | OROMUCOSAL | Status: DC
  Administered 2021-05-27: 15 mL via OROMUCOSAL

## 2021-05-26 NOTE — Procedures (Signed)
Ultrasound-guided diagnostic and therapeutic right thoracentesis performed yielding 1.3 liters of blood-tinged fluid. No immediate complications. Follow-up chest x-ray pending. The fluid was sent to the lab for preordered studies. EBL < 2 cc.

## 2021-05-26 NOTE — Plan of Care (Signed)

## 2021-05-26 NOTE — Progress Notes (Signed)
ANTICOAGULATION CONSULT NOTE - Follow Up Consult  Pharmacy Consult for Heparin Indication: pulmonary embolus  No Known Allergies  Patient Measurements: Height: 5\' 7"  (170.2 cm) Weight: 118.8 kg (262 lb) IBW/kg (Calculated) : 66.1 Heparin Dosing Weight: 93.5 kg  Vital Signs: Temp: 101 F (38.3 C) (08/24 0348) Temp Source: Oral (08/24 0348) BP: 133/62 (08/24 0348) Pulse Rate: 91 (08/24 0348)  Labs: Recent Labs    05/25/21 0947 05/25/21 0948 05/25/21 1628 05/25/21 2016 05/26/21 0502  HGB 12.0*  --   --  11.4* 11.9*  HCT 36.2*  --   --  35.7* 37.1*  PLT 472*  --   --  495* 488*  APTT  --  31  --   --   --   LABPROT  --  16.0*  --   --   --   INR  --  1.3*  --   --   --   HEPARINUNFRC  --   --  0.19* 0.41 0.13*  CREATININE 1.83*  --   --  1.58* 1.48*  TROPONINIHS 9  --  7  --  11     Estimated Creatinine Clearance: 50.7 mL/min (A) (by C-G formula based on SCr of 1.48 mg/dL (H)).   Medications:  Infusions:   sodium chloride 10 mL/hr at 05/25/21 2125   heparin 1,600 Units/hr (05/26/21 0004)    Assessment: [name] with PMHx of DM and HTN. Arrives to ED with SOB for past week, with increased WOB. CXR with effusion, with CTA chest which showed PE with segmental to subsegmental bolus in RUL. No elevation of RV:LV. Also found to have large mass in R middle lobe.   05/26/2021 HL 0.13 sub-therapeutic on 1600 units/hr Hgb 11.9, plts 488 Per RN no interruptions and no bleeding   Goal of Therapy:  Heparin level 0.3-0.7 units/ml Monitor platelets by anticoagulation protocol: Yes   Plan:  Heparin bolus 3000 units x 1 Increase heparin drip to 1900 units/hr Heparin level in 8 hours Daily heparin level and CBC   Kevin Hobbs RPh 05/26/2021, 5:53 AM

## 2021-05-26 NOTE — Plan of Care (Signed)

## 2021-05-26 NOTE — Progress Notes (Signed)
Ultrasound states Heparin should be restarted 2 hours post procedure - 5PM - at 19 units per hour.  PTT in 6 hour post restart

## 2021-05-26 NOTE — Progress Notes (Signed)
PROGRESS NOTE    Kevin Hobbs  GXQ:[id number] DOB: [birth year] DOA: 05/25/2021 PCP: Robyne Peers, MD  Brief Narrative: The patient is a 80 year old African-American male with a past medical history significant for but not limited to diabetes mellitus type 2, hypertension, history of tobacco abuse who quit smoking 1996, as well as other comorbidities who presented with shortness of breath and a dry cough last week.  Patient has had abdominal pain at and was also seen by PCP.  He had abdominal ultrasound yesterday which showed no cholecystitis but did show a small pleural effusion.  He went for blood work his PCPs office and developed extreme shortness breath so sent to the ED for further evaluation.  In the ED CTA of the chest was done and showed a pulmonary embolus and right-sided pleural effusion with right middle lobe mass measuring 7.9 x 7.7 cm.  It also showed markedly enlarged subcarinal lymph nodes concerning for nodal metastatic disease.  He was also noted to have a moderate right pleural effusion with associated atelectasis.  He did not have any diarrhea, nausea, vomiting and denies any complaints of cough or chills.  He was admitted for his right lung mass and acute PE and pleural effusion and pulmonary was consulted.  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  Pulmonology has been consulted, he will likely need diagnostic and therapeutic thoracentesis.  -Further management  as per recommendations by pulmonology. -Patient underwent a thoracentesis today yielding 1.3 L of blood-tinged fluid -Pulmonary recommending sending for cytology and doing therapeutic thoracentesis. -We will need outpatient PET scan and further biopsy based on PET scan results including EBUS  AKI on CKD stage IIIa -Patient's BUNs/creatinine went from 35/1.3 and then trended down to 31/1.48 -Avoid further nephrotoxic medications, contrast dyes, hypotension renally adjust medications -Continue monitor and trend renal function carefully and repeat CMP in a.m.  Normocytic anemia -Mild and hemoglobin appears stable -Patient's hemoglobin/hematocrit is now 9.9/37.1 -Check anemia panel in the a.m. -Continue to monitor for signs and symptoms of bleeding now the patient is anticoagulated the heparin drip; no overt bleeding noted -Repeat CBC in a.m.  Pulmonary embolism -CTA chest shows segmental to subsegmental embolus in the right upper lobe.  Patient started on IV heparin per pharmacy.  We will continue IV heparin for 48 to 72 hours due to risk of decompensation and then start a oral anticoagulant DOAC if there is no contraindication -Troponin was normal and was not elevated as it ranged from 9 -> 7 -> 11 -Currently only 2 L of supplemental oxygen as above no elevation of RV/LV ratio.   -Enlargement of main pulmonary artery concerning for pulmonary hypertension.  -Will obtain echocardiogram and he will need lower extremity duplex as well   Moderate right pleural effusion -Will need diagnostic and therapeutic thoracentesis.  -Pulmonology has been consulted. -Pulmonary recommending interventional radiology involvement and they have taken the patient for a thoracentesis which yielded 1.3 L of blood-tinged fluid  Hypertension -Blood pressure is soft,  will hold antihypertensive medications at this time. -Continue monitor blood pressures per protocol -Last blood pressure reading was  133/65  Diabetes Mellitus Type 2 -hold metformin, will initiate sliding scale insulin with NovoLog.  GERD -Continue PPI with Pantoprazole 40 mg p.o. daily  Thrombocytosis -Likely reactive  -Patient's platelet count went from 472 and trended up to 495 is now 488 -Continue to monitor for signs and symptoms of bleeding -Repeat CBC in a.m.  Morbid Obesity -Complicates overall prognosis and care -Estimated body mass index is 41.04 kg/m as calculated from the following:   Height as of this encounter: 5\' 7"  (1.702 m).   Weight as of this encounter: 118.8 kg. -Weight Loss and Dietary Counseling given    DVT prophylaxis: Anticoagulated with a heparin drip Code Status: FULL CODE  Family Communication: Discussed with wife at bedside  Disposition Plan: Pending further clinical improvement and evaluation by PT OT and clearance by pulmonary  Status is: Inpatient  Remains inpatient appropriate because:Unsafe d/c plan, IV treatments appropriate due to intensity of illness or inability to take PO, and Inpatient level of care appropriate due to severity of illness  Dispo: The patient is from: Home              Anticipated d/c is to: Home              Patient currently is not medically stable to d/c.   Difficult to place patient No  Consultants:  Pulmonary/PCCM Interventional radiology  Procedures:  ECHOCARDIOGRAM IMPRESSIONS     1. Left ventricular ejection fraction, by estimation, is 60 to 65%. The  left ventricle has normal function. Left ventricular endocardial border  not optimally defined to evaluate regional wall motion. The left  ventricular internal cavity size was mildly  to moderately dilated. Left ventricular diastolic parameters are  indeterminate.   2. Right ventricular systolic function is normal. The right ventricular  size is normal.   3. The mitral valve is normal in structure. No evidence of mitral valve  regurgitation.   4. The aortic valve is grossly normal.  Aortic valve regurgitation is not  visualized.   FINDINGS   Left Ventricle: Left ventricular ejection fraction, by estimation, is 60  to 65%. The left ventricle has normal function. Left ventricular  endocardial border not optimally defined to evaluate regional wall motion.  The left ventricular internal cavity  size was mildly to moderately dilated. There is no left ventricular  hypertrophy. Left ventricular diastolic parameters are indeterminate.   Right Ventricle: The right ventricular size is normal. Right vetricular  wall thickness was not well visualized. Right ventricular systolic  function is normal.   Left Atrium: Left atrial size was normal in size.   Right Atrium: Right atrial size was normal in size.   Pericardium: There is no evidence of pericardial effusion.   Mitral Valve: The mitral valve is normal in structure. No evidence of  mitral valve regurgitation.   Tricuspid Valve: The tricuspid valve is grossly normal. Tricuspid valve  regurgitation is mild.   Aortic Valve: The aortic valve is grossly normal. Aortic valve  regurgitation is not visualized.   Pulmonic Valve: The pulmonic valve was grossly normal. Pulmonic valve  regurgitation is not visualized.   Aorta: The aortic root and ascending aorta are structurally normal, with  no evidence of dilitation.   IAS/Shunts: The atrial septum is grossly normal.      LEFT VENTRICLE  PLAX 2D  LVIDd:  5.10 cm      Diastology  LVIDs:         3.20 cm      LV e' medial:    6.96 cm/s  LV PW:         1.20 cm      LV E/e' medial:  9.4  LV IVS:        1.00 cm      LV e' lateral:   6.09 cm/s  LVOT diam:     2.40 cm      LV E/e' lateral: 10.7  LV SV:         79  LV SV Index:   35  LVOT Area:     4.52 cm     LV Volumes (MOD)  LV vol d, MOD A4C: 157.0 ml  LV vol s, MOD A4C: 66.8 ml  LV SV MOD A4C:     157.0 ml   RIGHT VENTRICLE  RV Basal diam:  2.50 cm  RV S prime:     16.80 cm/s  TAPSE (M-mode): 2.4 cm    LEFT ATRIUM             Index       RIGHT ATRIUM           Index  LA diam:        3.10 cm 1.37 cm/m  RA Area:     13.80 cm  LA Vol (A2C):   65.9 ml 29.05 ml/m RA Volume:   36.60 ml  16.14 ml/m  LA Vol (A4C):   44.3 ml 19.53 ml/m  LA Biplane Vol: 54.5 ml 24.03 ml/m   AORTIC VALVE  LVOT Vmax:   110.00 cm/s  LVOT Vmean:  64.400 cm/s  LVOT VTI:    0.174 m     AORTA  Ao Root diam: 2.90 cm   MITRAL VALVE  MV Area (PHT): 3.33 cm    SHUNTS  MV Decel Time: 228 msec    Systemic VTI:  0.17 m  MV E velocity: 65.10 cm/s  Systemic Diam: 2.40 cm  MV A velocity: 78.40 cm/s  MV E/A ratio:  0.83   THORACENTESIS Ultrasound-guided diagnostic and therapeutic right thoracentesis performed yielding 1.3 liters of blood-tinged fluid. No immediate complications. Follow-up chest x-ray pending. The fluid was sent to the lab for preordered studies. EBL < 2 cc.                                                  Antimicrobials:  Anti-infectives (From admission, onward)    None        Subjective: Seen and examined at bedside and thinks he is doing little bit better but still dyspneic but not as much.  No chest pain or lightheadedness or dizziness.  Understands that he will be undergoing a thoracentesis today.  No other concerns or complaints at this time.  Objective: Vitals:   05/25/21 2152 05/25/21 2350 05/26/21 0348 05/26/21 0748  BP:  (!) 143/73 133/62   Pulse: 90 93 91   Resp: (!) 24 (!) 21 16   Temp:  100.2 F (37.9 C) (!) 101 F (38.3 C)   TempSrc:  Oral Oral   SpO2: 93% 93% 92% 94%  Weight:      Height:        Intake/Output Summary (Last 24 hours)  at 05/26/2021 5277 Last data filed at 05/26/2021 0631 Gross per 24 hour  Intake 344.42 ml  Output 950 ml  Net -605.58 ml   Filed Weights   05/25/21 0919  Weight: 118.8 kg   Examination: Physical Exam:  Constitutional: WN/WD obese African-American male currently in NAD and appears calm and comfortable Eyes: Lids and conjunctivae  normal, sclerae anicteric  ENMT: External Ears, Nose appear normal. Grossly normal hearing.  Neck: Appears normal, supple, no cervical masses, normal ROM, no appreciable thyromegaly; no JVD Respiratory: Diminished to auscultation bilaterally with coarse breath sounds and he is wearing supplemental oxygen via nasal cannula.  Has diminished breath sounds on the right compared to left with some crackles noted.  Cardiovascular: RRR, no murmurs / rubs / gallops. S1 and S2 auscultated. No extremity edema Abdomen: Soft, non-tender, distended secondary body habitus. Bowel sounds positive.  GU: Deferred. Musculoskeletal: No clubbing / cyanosis of digits/nails. No joint deformity upper and lower extremities.  Skin: No rashes, lesions, ulcers on limited skin evaluation. No induration; Warm and dry.  Neurologic: CN 2-12 grossly intact with no focal deficits. Romberg sign and cerebellar reflexes not assessed.  Psychiatric: Normal judgment and insight. Alert and oriented x 3. Normal mood and appropriate affect.   Data Reviewed: I have personally reviewed following labs and imaging studies  CBC: Recent Labs  Lab 05/25/21 0947 05/25/21 2016 05/26/21 0502  WBC 10.5 9.7 9.4  NEUTROABS 7.3  --   --   HGB 12.0* 11.4* 11.9*  HCT 36.2* 35.7* 37.1*  MCV 86.6 88.4 88.1  PLT 472* 495* 824*   Basic Metabolic Panel: Recent Labs  Lab 05/25/21 0947 05/25/21 2016 05/26/21 0502  NA 137  --  141  K 4.5  --  4.2  CL 105  --  111  CO2 19*  --  18*  GLUCOSE 106*  --  105*  BUN 35*  --  31*  CREATININE 1.83* 1.58* 1.48*  CALCIUM 8.8*  --  8.9   GFR: Estimated Creatinine Clearance: 50.7 mL/min (A) (by C-G formula based on SCr of 1.48 mg/dL (H)). Liver Function Tests: Recent Labs  Lab 05/25/21 0947 05/26/21 0502  AST 43* 62*  ALT 27 28  ALKPHOS 120 125  BILITOT 1.1 1.0  PROT 6.8 7.1  ALBUMIN 2.3* 2.5*   No results for input(s): LIPASE, AMYLASE in the last 168 hours. No results for input(s):  AMMONIA in the last 168 hours. Coagulation Profile: Recent Labs  Lab 05/25/21 0948  INR 1.3*   Cardiac Enzymes: No results for input(s): CKTOTAL, CKMB, CKMBINDEX, TROPONINI in the last 168 hours. BNP (last 3 results) No results for input(s): PROBNP in the last 8760 hours. HbA1C: Recent Labs    05/25/21 1806  HGBA1C 6.8*   CBG: Recent Labs  Lab 05/25/21 1740 05/25/21 2053 05/26/21 0714  GLUCAP 95 120* 108*   Lipid Profile: No results for input(s): CHOL, HDL, LDLCALC, TRIG, CHOLHDL, LDLDIRECT in the last 72 hours. Thyroid Function Tests: No results for input(s): TSH, T4TOTAL, FREET4, T3FREE, THYROIDAB in the last 72 hours. Anemia Panel: No results for input(s): VITAMINB12, FOLATE, FERRITIN, TIBC, IRON, RETICCTPCT in the last 72 hours. Sepsis Labs: Recent Labs  Lab 05/26/21 0502  LATICACIDVEN 1.1    Recent Results (from the past 240 hour(s))  Resp Panel by RT-PCR (Flu A&B, Covid) Nasopharyngeal Swab     Status: None   Collection Time: 05/25/21  9:47 AM   Specimen: Nasopharyngeal Swab; Nasopharyngeal(NP) swabs in vial transport medium  Result Value Ref Range Status   SARS Coronavirus 2 by RT PCR NEGATIVE NEGATIVE Final    Comment: (NOTE) SARS-CoV-2 target nucleic acids are NOT DETECTED.  The SARS-CoV-2 RNA is generally detectable in upper respiratory specimens during the acute phase of infection. The lowest concentration of SARS-CoV-2 viral copies this assay can detect is 138 copies/mL. A negative result does not preclude SARS-Cov-2 infection and should not be used as the sole basis for treatment or other patient management decisions. A negative result may occur with  improper specimen collection/handling, submission of specimen other than nasopharyngeal swab, presence of viral mutation(s) within the areas targeted by this assay, and inadequate number of viral copies(<138 copies/mL). A negative result must be combined with clinical observations, patient history,  and epidemiological information. The expected result is Negative.  Fact Sheet for Patients:  EntrepreneurPulse.com.au  Fact Sheet for Healthcare Providers:  IncredibleEmployment.be  This test is no t yet approved or cleared by the [place] FDA and  has been authorized for detection and/or diagnosis of SARS-CoV-2 by FDA under an Emergency Use Authorization (EUA). This EUA will remain  in effect (meaning this test can be used) for the duration of the COVID-19 declaration under Section 564(b)(1) of the Act, 21 U.S.C.section 360bbb-3(b)(1), unless the authorization is terminated  or revoked sooner.       Influenza A by PCR NEGATIVE NEGATIVE Final   Influenza B by PCR NEGATIVE NEGATIVE Final    Comment: (NOTE) The Xpert Xpress SARS-CoV-2/FLU/RSV plus assay is intended as an aid in the diagnosis of influenza from Nasopharyngeal swab specimens and should not be used as a sole basis for treatment. Nasal washings and aspirates are unacceptable for Xpert Xpress SARS-CoV-2/FLU/RSV testing.  Fact Sheet for Patients: EntrepreneurPulse.com.au  Fact Sheet for Healthcare Providers: IncredibleEmployment.be  This test is not yet approved or cleared by the [place] FDA and has been authorized for detection and/or diagnosis of SARS-CoV-2 by FDA under an Emergency Use Authorization (EUA). This EUA will remain in effect (meaning this test can be used) for the duration of the COVID-19 declaration under Section 564(b)(1) of the Act, 21 U.S.C. section 360bbb-3(b)(1), unless the authorization is terminated or revoked.  Performed at [facility], [address] [place], [place] [address]     RN Pressure Injury Documentation:     Estimated body mass index is 41.04 kg/m as calculated from the following:   Height as of this encounter: 5\' 7"  (1.702 m).   Weight as of this encounter: 118.8  kg.  Malnutrition Type:   Malnutrition Characteristics:   Nutrition Interventions:     Radiology Studies: CT Angio Chest PE W and/or Wo Contrast  Result Date: 05/25/2021 CLINICAL DATA:  Shortness of breath EXAM: CT ANGIOGRAPHY CHEST WITH CONTRAST TECHNIQUE: Multidetector CT imaging of the chest was performed using the standard protocol during bolus administration of intravenous contrast. Multiplanar CT image reconstructions and MIPs were obtained to evaluate the vascular anatomy. CONTRAST:  13mL OMNIPAQUE IOHEXOL 350 MG/ML SOLN COMPARISON:  None. FINDINGS: Cardiovascular: Examination for pulmonary embolism is limited by marginal contrast bolus, main pulmonary artery = 141 HU. Within this limitation, positive examination for pulmonary embolism, with segmental to subsegmental embolus present in the right upper lobe (series 4, image 37). Normal heart size. Three-vessel coronary artery calcifications. No pericardial effusion. Aortic atherosclerosis. Enlargement of the main pulmonary artery measuring up to 3.6 cm in caliber. RV LV ratio is preserved, approximately 0.6. Mediastinum/Nodes: Markedly enlarged subcarinal nodes measuring at least  6.1 x 2.6 cm (series 4, image 52) thyroid gland, trachea, and esophagus demonstrate no significant findings. Lungs/Pleura: Moderate right pleural effusion associated atelectasis or consolidation. There is a large mass centered in the right middle lobe, which occludes the middle lobe segmental bronchi, although difficult to distinguish from airspace consolidation and adjacent atelectasis, this measures approximately 7.9 x 7.7 cm (series 4, image 55). Upper Abdomen: No acute abnormality. Gallstones and or sludge in the dependent gallbladder. Musculoskeletal: No chest wall abnormality. No acute or significant osseous findings. Review of the MIP images confirms the above findings. IMPRESSION: 1. Positive examination for pulmonary embolism, with segmental to subsegmental  embolus present in the right upper lobe. 2. Enlargement of the main pulmonary artery, concerning for pulmonary hypertension. No elevation of the RV LV ratio. 3. There is a large mass centered in the right middle lobe, which occludes the middle lobe segmental bronchi, although difficult to distinguish from airspace consolidation and adjacent atelectasis, this measures approximately 7.9 x 7.7 cm. 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 4. Markedly enlarged subcarinal lymph nodes, concerning for nodal metastatic disease. 5. Moderate right pleural effusion and associated atelectasis or consolidation, presumably malignant, however without direct evidence of pleural metastatic disease. 6. Coronary artery disease. These results were called by telephone at the time of interpretation on 05/25/2021 at 11:29 am to Dr. Lennice Sites , who verbally acknowledged these results. Aortic Atherosclerosis (ICD10-I70.0). Electronically Signed   By: Eddie Candle M.D.   On: 05/25/2021 11:30   DG Chest Portable 1 View  Result Date: 05/25/2021 CLINICAL DATA:  Breath for 1 week, hypertension, RIGHT pleural effusion by abdominal ultrasound EXAM: PORTABLE CHEST 1 VIEW COMPARISON:  Portable exam 0920 hours without priors for comparison FINDINGS: Enlargement of cardiac silhouette with pulmonary vascular congestion. Atherosclerotic calcification aorta. RIGHT pleural effusion and basilar atelectasis versus consolidation. Minimal atelectasis at LEFT base. Upper lungs clear. No pneumothorax. Bones demineralized. IMPRESSION: Enlargement of cardiac silhouette with pulmonary vascular congestion. RIGHT pleural effusion with basilar atelectasis and/or consolidation. Minimal LEFT basilar atelectasis. Electronically Signed   By: Lavonia Dana M.D.   On: 05/25/2021 10:03     Scheduled Meds:  albuterol  2.5 mg Nebulization TID   arformoterol  15 mcg Nebulization BID   budesonide (PULMICORT) nebulizer solution  0.25 mg Nebulization  BID   chlorhexidine  15 mL Mouth Rinse BID   guaiFENesin  600 mg Oral BID   insulin aspart  0-9 Units Subcutaneous TID WC   mouth rinse  15 mL Mouth Rinse q12n4p   pantoprazole  40 mg Oral Daily   revefenacin  175 mcg Nebulization Daily   simvastatin  10 mg Oral Daily   sodium chloride  1 spray Nasal QHS   Continuous Infusions:  sodium chloride 10 mL/hr at 05/25/21 2125   heparin 1,900 Units/hr (05/26/21 0631)    LOS: 1 day   Kerney Elbe, DO [facility] PAGER is on AMION  If 7PM-7AM, please contact night-coverage [email or web address]

## 2021-05-26 NOTE — Progress Notes (Signed)
  Echocardiogram 2D Echocardiogram has been performed.  Kevin Hobbs 05/26/2021, 12:33 PM

## 2021-05-26 NOTE — Progress Notes (Signed)
ANTICOAGULATION CONSULT NOTE - Follow Up Consult  Pharmacy Consult for heparin Indication: acute pulmonary embolus  No Known Allergies  Patient Measurements: Height: 5\' 7"  (170.2 cm) Weight: 118.8 kg (262 lb) IBW/kg (Calculated) : 66.1 Heparin Dosing Weight: 93 kg  Vital Signs: Temp: 97.8 F (36.6 C) (08/24 0800) Temp Source: Oral (08/24 0800) BP: 144/80 (08/24 0800) Pulse Rate: 88 (08/24 0800)  Labs: Recent Labs    05/25/21 0947 05/25/21 0948 05/25/21 1628 05/25/21 2016 05/26/21 0502  HGB 12.0*  --   --  11.4* 11.9*  HCT 36.2*  --   --  35.7* 37.1*  PLT 472*  --   --  495* 488*  APTT  --  31  --   --   --   LABPROT  --  16.0*  --   --   --   INR  --  1.3*  --   --   --   HEPARINUNFRC  --   --  0.19* 0.41 0.13*  CREATININE 1.83*  --   --  1.58* 1.48*  TROPONINIHS 9  --  7  --  11    Estimated Creatinine Clearance: 50.7 mL/min (A) (by C-G formula based on SCr of 1.48 mg/dL (H)).    Assessment: Patient is a 80 y.o M presented to the ED on 05/25/21 with c/o SOB. Chest CT showed acute PE, right pleural effusion and lung lung mass. He's currently on heparin drip for VTE treatment.  Today, 05/26/2021: - heparin level collected at 1411 is undetectable. However, heparin drip was stopped at ~1330 for thoracentesis. Patient underwent thoracentesis at ~1500. Rowe Robert stated that Dr. Chase Caller recom. to resume heparin drip back 2 hours after procedure. - cbc stable - no bleeding documented - scr tredning down  Goal of Therapy:  Heparin level 0.3-0.7 units/ml Monitor platelets by anticoagulation protocol: Yes   Plan:  - resume heparin drip back at 5PM at 2000 units/hr - check 8 hr heparin level - monitor for s/sx bleeding  Kevin Hobbs P 05/26/2021,2:46 PM

## 2021-05-27 ENCOUNTER — Telehealth: Payer: Self-pay | Admitting: Internal Medicine

## 2021-05-27 ENCOUNTER — Inpatient Hospital Stay (HOSPITAL_COMMUNITY): Payer: PPO

## 2021-05-27 DIAGNOSIS — R918 Other nonspecific abnormal finding of lung field: Secondary | ICD-10-CM

## 2021-05-27 DIAGNOSIS — I2699 Other pulmonary embolism without acute cor pulmonale: Secondary | ICD-10-CM

## 2021-05-27 DIAGNOSIS — I82452 Acute embolism and thrombosis of left peroneal vein: Secondary | ICD-10-CM

## 2021-05-27 LAB — CBC WITH DIFFERENTIAL/PLATELET
Abs Immature Granulocytes: 0.2 10*3/uL — ABNORMAL HIGH (ref 0.00–0.07)
Basophils Absolute: 0.1 10*3/uL (ref 0.0–0.1)
Basophils Relative: 1 %
Eosinophils Absolute: 0.2 10*3/uL (ref 0.0–0.5)
Eosinophils Relative: 2 %
HCT: 34.7 % — ABNORMAL LOW (ref 39.0–52.0)
Hemoglobin: 11.1 g/dL — ABNORMAL LOW (ref 13.0–17.0)
Immature Granulocytes: 2 %
Lymphocytes Relative: 15 %
Lymphs Abs: 1.5 10*3/uL (ref 0.7–4.0)
MCH: 28 pg (ref 26.0–34.0)
MCHC: 32 g/dL (ref 30.0–36.0)
MCV: 87.6 fL (ref 80.0–100.0)
Monocytes Absolute: 1.3 10*3/uL — ABNORMAL HIGH (ref 0.1–1.0)
Monocytes Relative: 13 %
Neutro Abs: 6.8 10*3/uL (ref 1.7–7.7)
Neutrophils Relative %: 67 %
Platelets: 501 10*3/uL — ABNORMAL HIGH (ref 150–400)
RBC: 3.96 MIL/uL — ABNORMAL LOW (ref 4.22–5.81)
RDW: 15 % (ref 11.5–15.5)
WBC: 10 10*3/uL (ref 4.0–10.5)
nRBC: 0 % (ref 0.0–0.2)

## 2021-05-27 LAB — GLUCOSE, CAPILLARY
Glucose-Capillary: 123 mg/dL — ABNORMAL HIGH (ref 70–99)
Glucose-Capillary: 132 mg/dL — ABNORMAL HIGH (ref 70–99)

## 2021-05-27 LAB — COMPREHENSIVE METABOLIC PANEL
ALT: 30 U/L (ref 0–44)
AST: 62 U/L — ABNORMAL HIGH (ref 15–41)
Albumin: 2.3 g/dL — ABNORMAL LOW (ref 3.5–5.0)
Alkaline Phosphatase: 116 U/L (ref 38–126)
Anion gap: 10 (ref 5–15)
BUN: 33 mg/dL — ABNORMAL HIGH (ref 8–23)
CO2: 20 mmol/L — ABNORMAL LOW (ref 22–32)
Calcium: 8.9 mg/dL (ref 8.9–10.3)
Chloride: 110 mmol/L (ref 98–111)
Creatinine, Ser: 1.58 mg/dL — ABNORMAL HIGH (ref 0.61–1.24)
GFR, Estimated: 44 mL/min — ABNORMAL LOW (ref >60)
Glucose, Bld: 150 mg/dL — ABNORMAL HIGH (ref 70–99)
Potassium: 4.3 mmol/L (ref 3.5–5.1)
Sodium: 140 mmol/L (ref 135–145)
Total Bilirubin: 1.2 mg/dL (ref 0.3–1.2)
Total Protein: 7 g/dL (ref 6.5–8.1)

## 2021-05-27 LAB — HEPARIN LEVEL (UNFRACTIONATED)
Heparin Unfractionated: 0.22 IU/mL — ABNORMAL LOW (ref 0.30–0.70)
Heparin Unfractionated: 0.28 IU/mL — ABNORMAL LOW (ref 0.30–0.70)

## 2021-05-27 LAB — AMYLASE, PLEURAL OR PERITONEAL FLUID: Amylase, Fluid: 60 U/L

## 2021-05-27 LAB — MAGNESIUM: Magnesium: 2.3 mg/dL (ref 1.7–2.4)

## 2021-05-27 LAB — PHOSPHORUS: Phosphorus: 4.6 mg/dL (ref 2.5–4.6)

## 2021-05-27 LAB — TRIGLYCERIDES, BODY FLUIDS: Triglycerides, Fluid: 34 mg/dL

## 2021-05-27 MED ORDER — ONDANSETRON HCL 4 MG PO TABS: 4.0000 mg | ORAL_TABLET | Freq: Four times a day (QID) | ORAL | 0 refills | Status: AC | PRN

## 2021-05-27 MED ORDER — BUDESONIDE-FORMOTEROL FUMARATE 80-4.5 MCG/ACT IN AERO: 2.0000 | INHALATION_SPRAY | Freq: Two times a day (BID) | RESPIRATORY_TRACT | 12 refills | Status: AC

## 2021-05-27 MED ORDER — UMECLIDINIUM BROMIDE 62.5 MCG/INH IN AEPB
1.0000 | INHALATION_SPRAY | Freq: Every day | RESPIRATORY_TRACT | Status: DC
  Administered 2021-05-27: 1 via RESPIRATORY_TRACT
  Filled 2021-05-27: qty 7

## 2021-05-27 MED ORDER — ACETAMINOPHEN 325 MG PO TABS: 650.0000 mg | ORAL_TABLET | Freq: Four times a day (QID) | ORAL | 0 refills | Status: AC | PRN

## 2021-05-27 MED ORDER — GUAIFENESIN ER 600 MG PO TB12: 600.0000 mg | ORAL_TABLET | Freq: Two times a day (BID) | ORAL | 0 refills | Status: AC

## 2021-05-27 MED ORDER — APIXABAN 5 MG PO TABS: ORAL_TABLET | ORAL | 0 refills | Status: AC

## 2021-05-27 MED ORDER — SPIRIVA HANDIHALER 18 MCG IN CAPS: 18.0000 ug | ORAL_CAPSULE | Freq: Every day | RESPIRATORY_TRACT | 12 refills | Status: AC

## 2021-05-27 MED ORDER — FLUTICASONE FUROATE-VILANTEROL 100-25 MCG/INH IN AEPB
1.0000 | INHALATION_SPRAY | Freq: Every day | RESPIRATORY_TRACT | Status: DC
  Administered 2021-05-27: 1 via RESPIRATORY_TRACT
  Filled 2021-05-27: qty 28

## 2021-05-27 MED ORDER — APIXABAN 5 MG PO TABS
10.0000 mg | ORAL_TABLET | Freq: Two times a day (BID) | ORAL | Status: DC
  Administered 2021-05-27: 10 mg via ORAL
  Filled 2021-05-27: qty 2

## 2021-05-27 MED ORDER — ALBUTEROL SULFATE (2.5 MG/3ML) 0.083% IN NEBU: 3.0000 mL | INHALATION_SOLUTION | Freq: Four times a day (QID) | RESPIRATORY_TRACT | Status: DC | PRN

## 2021-05-27 MED ORDER — APIXABAN 5 MG PO TABS: 5.0000 mg | ORAL_TABLET | Freq: Two times a day (BID) | ORAL | Status: DC

## 2021-05-27 NOTE — Telephone Encounter (Signed)
Triage  [facility] has following appt 9/2 with Beth . Please ensure a PET scan asap ideally before seeing Beth   Lung mass    Thanks    SIGNATURE    Dr. Brand Males, M.D., F.C.C.P,  Pulmonary and Critical Care Medicine Staff Physician, [place] Director - Interstitial Lung Disease  Program  P[facility] [place] [facility] [place], [place], [address]  NPI Number:  NPI #[id number]  Pager: [phone], If no answer  -> Check AMION or Try [facility] Telephone (clinical office): [phone] Telephone (research): [phone]  9:33 AM 05/27/2021

## 2021-05-27 NOTE — Discharge Summary (Signed)
Physician Discharge Summary  Kevin Hobbs IWL:[id number] DOB: 1941-05-24 DOA: 05/25/2021  PCP: Robyne Peers, MD  Admit date: 05/25/2021 Discharge date: 05/27/2021  Admitted From: Home Disposition: Home with Home Health   Recommendations for Outpatient Follow-up:  Follow up with PCP in 1-2 weeks Follow up with Pulmonary on 06/04/21 Please obtain CMP/CBC, Mag, Phos in one week Please follow up on the following pending results:  Home Health: Yes Equipment/Devices: Supplemental O2     Discharge Condition: Stable CODE STATUS: FULL CODE  Diet recommendation: Heart Healthy Diet   Brief/Interim Summary: The patient is a 80 year old African-American male with a past medical history significant for but not limited to diabetes mellitus type 2, hypertension, history of tobacco abuse who quit smoking 1996, as well as other comorbidities who presented with shortness of breath and a dry cough last week.  Patient has had abdominal pain at and was also seen by PCP.  He had abdominal ultrasound yesterday which showed no cholecystitis but did show a small pleural effusion.  He went for blood work his PCPs office and developed extreme shortness breath so sent to the ED for further evaluation.  In the ED CTA of the chest was done and showed a pulmonary embolus and right-sided pleural effusion with right middle lobe mass measuring 7.9 x 7.7 cm.  It also showed markedly enlarged subcarinal lymph nodes concerning for nodal metastatic disease.  He was also noted to have a moderate right pleural effusion with associated atelectasis.  He did not have any diarrhea, nausea, vomiting and denies any complaints of cough or chills.  He was admitted for his right lung mass and acute PE and pleural effusion and pulmonary was consulted.  He underwent a thoracentesis and subsequent work-up showed that he had PE.  Will need outpatient PET scan and pulmonary cleared the patient for discharge.  He ambulated prior to discharge and  desaturated so will be going home on supplemental oxygen.  Lower extremity venous duplex done prior to discharge was positive for DVT.  All questions were answered to his satisfaction and he will have continued work-up of his mass and get a PET scan.  Patient was sent home on some inhalers and he was transitioned to oral anticoagulation  Discharge Diagnoses:  Active Problems:   Pulmonary embolism (HCC)   Pulmonary emboli (HCC)  Acute Respiratory Failure with Hypoxia -The setting of right lung malignancy, acute pulmonary embolus as well as right-sided pleural effusion -SpO2: 94 % O2 Flow Rate (L/min): 2 L/min -Continuous Pulse Oximetry and Maintain O2 Saturations >90% -Continue Supplemental O2 via [place] and Wean O2 as Tolerated -He was started on DuoNeb; this has been changed to inhalers and will be sent home on Symbicort and Spiriva -Will need an Ambulatory Home O2 Screen prior to D/C and he did desaturate so we will go home at least 2 L of supplemental oxygen   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  Pulmonology has been consulted, he will likely need diagnostic and therapeutic thoracentesis.  -Further management as per recommendations by pulmonology. -Patient underwent a thoracentesis today yielding 1.3 L of blood-tinged fluid -Pulmonary recommending sending for cytology and doing therapeutic thoracentesis. -We will need outpatient PET scan and further biopsy based on PET scan results including EBUS -Pulmonary is arranging this and he will follow-up with pulmonary in 1 to 2 weeks   AKI on CKD stage IIIa -Patient's BUNs/creatinine is relatively stable at 33/1.58 -Avoid further nephrotoxic medications, contrast dyes,  hypotension renally adjust medications -Continue monitor and trend renal function carefully and repeat CMP within 1 week   Normocytic anemia -Mild and hemoglobin appears stable -Patient's hemoglobin/hematocrit is now 11.1/34.701 -Check  anemia panel in the outpatient setting -Continue to monitor for signs and symptoms of bleeding now the patient is anticoagulated the heparin drip; no overt bleeding noted -Repeat CBC in a.m.   Pulmonary embolism and left leg DVT  -CTA chest shows segmental to subsegmental embolus in the right upper lobe.  Patient started on IV heparin per pharmacy.  We will continue IV heparin for 48 to 72 hours due to risk of decompensation and then start a oral anticoagulant DOAC if there is no contraindication -Troponin was normal and was not elevated as it ranged from 9 -> 7 -> 11 -Currently only 2 L of supplemental oxygen as above no elevation of RV/LV ratio.   -Enlargement of main pulmonary artery concerning for pulmonary hypertension.  -Will obtain echocardiogram and he will need lower extremity duplex as well -Echocardiogram as below and venous duplex done prior to discharge showed consistent and acute DVT in the left peroneal veins    Moderate right pleural effusion -Will need diagnostic and therapeutic thoracentesis.  -Pulmonology has been consulted. -Pulmonary recommending interventional radiology involvement and they have taken the patient for a thoracentesis which yielded 1.3 L of blood-tinged fluid -Discussed with pulmonary about final cytology and pleural fluid studies results in outpatient setting   Hypertension -Blood pressure is soft, will hold antihypertensive medications at this time. -Continue monitor blood pressures per protocol -Last blood pressure reading was 133/65   Diabetes Mellitus Type 2 -hold metformin, will initiate sliding scale insulin with NovoLog. -Hemoglobin A1c was 6.8 -Resume home medications at discharge   GERD -Continue PPI with Pantoprazole 40 mg p.o. daily   Thrombocytosis -Likely reactive  -Patient's platelet count went from 472 and trended up to 495 is now 488 and today it is 5 1 -Continue to monitor for signs and symptoms of bleeding -Repeat CBC within 1  week   Morbid Obesity -Complicates overall prognosis and care -Estimated body mass index is 41.04 kg/m as calculated from the following:   Height as of this encounter: 5\' 7"  (1.702 m).   Weight as of this encounter: 118.8 kg. -Weight Loss and Dietary Counseling given    Discharge Instructions  Discharge Instructions     Call MD for:  difficulty breathing, headache or visual disturbances   Complete by: As directed    Call MD for:  extreme fatigue   Complete by: As directed    Call MD for:  hives   Complete by: As directed    Call MD for:  persistant dizziness or light-headedness   Complete by: As directed    Call MD for:  persistant nausea and vomiting   Complete by: As directed    Call MD for:  redness, tenderness, or signs of infection (pain, swelling, redness, odor or green/yellow discharge around incision site)   Complete by: As directed    Call MD for:  severe uncontrolled pain   Complete by: As directed    Call MD for:  temperature >100.4   Complete by: As directed    Diet - low sodium heart healthy   Complete by: As directed    Discharge instructions   Complete by: As directed    You were cared for by a hospitalist during your hospital stay. If you have any questions about your discharge medications or the care  you received while you were in the hospital after you are discharged, you can call the unit and ask to speak with the hospitalist on call if the hospitalist that took care of you is not available. Once you are discharged, your primary care physician will handle any further medical issues. Please note that NO REFILLS for any discharge medications will be authorized once you are discharged, as it is imperative that you return to your primary care physician (or establish a relationship with a primary care physician if you do not have one) for your aftercare needs so that they can reassess your need for medications and monitor your lab values.  Follow up with PCP,  Pulmonary and Oncology as an outpatient and have PET Scan done. Take all medications as prescribed. If symptoms change or worsen please return to the ED for evaluation   Increase activity slowly   Complete by: As directed       Allergies as of 05/27/2021   No Known Allergies      Medication List     STOP taking these medications    lansoprazole 30 MG capsule Commonly known as: PREVACID       TAKE these medications    acetaminophen 325 MG tablet Commonly known as: TYLENOL Take 2 tablets (650 mg total) by mouth every 6 (six) hours as needed for mild pain (or Fever >/= 101).   amLODipine 10 MG tablet Commonly known as: NORVASC Take 10 mg by mouth daily.   amoxicillin 500 MG tablet Commonly known as: AMOXIL Take 1,000 mg by mouth 2 (two) times daily.   apixaban 5 MG Tabs tablet Commonly known as: ELIQUIS Take 2 tablets (10 mg total) by mouth 2 (two) times daily for 7 days, THEN 1 tablet (5 mg total) 2 (two) times daily for 28 days. Start taking on: May 27, 2021   budesonide-formoterol 80-4.5 MCG/ACT inhaler Commonly known as: SYMBICORT Inhale 2 puffs into the lungs in the morning and at bedtime.   clarithromycin 500 MG tablet Commonly known as: BIAXIN Take 500 mg by mouth 2 (two) times daily. Start date : 05/24/21   guaiFENesin 600 MG 12 hr tablet Commonly known as: MUCINEX Take 1 tablet (600 mg total) by mouth 2 (two) times daily for 5 days.   losartan 100 MG tablet Commonly known as: COZAAR Take 100 mg by mouth daily.   metFORMIN 500 MG 24 hr tablet Commonly known as: GLUCOPHAGE-XR Take 1,000 mg by mouth daily.   ondansetron 4 MG tablet Commonly known as: ZOFRAN Take 1 tablet (4 mg total) by mouth every 6 (six) hours as needed for nausea.   pantoprazole 40 MG tablet Commonly known as: PROTONIX Take 40 mg by mouth daily.   simvastatin 10 MG tablet Commonly known as: ZOCOR Take 10 mg by mouth daily.   sodium chloride 0.65 % nasal spray Commonly  known as: OCEAN Place 1 spray into the nose at bedtime.   Spiriva HandiHaler 18 MCG inhalation capsule Generic drug: tiotropium Place 1 capsule (18 mcg total) into inhaler and inhale daily.               Durable Medical Equipment  (From admission, onward)           Start     Ordered   05/27/21 1213  For home use only DME oxygen  Once       Question Answer Comment  Length of Need 6 Months   Mode or (Route) Nasal cannula  Liters per Minute 2   Frequency Continuous (stationary and portable oxygen unit needed)   Oxygen delivery system Gas      05/27/21 1212            Follow-up Information     [facility] Follow up.   Why: home oxygen Contact information: [place] [id number] [phone]                No Known Allergies  Consultations: Pulmonary  Procedures/Studies: DG Chest 1 View  Result Date: 05/26/2021 CLINICAL DATA:  Status post right thoracentesis. EXAM: CHEST  1 VIEW COMPARISON:  One-view chest x-ray 05/25/2021 FINDINGS: Heart is enlarged. Right pleural effusion is significantly decreased. No pneumothorax is present. Left lung is clear. Pulmonary vascular congestion noted. IMPRESSION: 1. Right pleural effusion without evidence for complication. 2. Pulmonary vascular congestion. Electronically Signed   By: San Morelle M.D.   On: 05/26/2021 15:36   CT Angio Chest PE W and/or Wo Contrast  Result Date: 05/25/2021 CLINICAL DATA:  Shortness of breath EXAM: CT ANGIOGRAPHY CHEST WITH CONTRAST TECHNIQUE: Multidetector CT imaging of the chest was performed using the standard protocol during bolus administration of intravenous contrast. Multiplanar CT image reconstructions and MIPs were obtained to evaluate the vascular anatomy. CONTRAST:  50mL OMNIPAQUE IOHEXOL 350 MG/ML SOLN COMPARISON:  None. FINDINGS: Cardiovascular: Examination for pulmonary embolism is limited by marginal contrast bolus, main pulmonary artery =  141 HU. Within this limitation, positive examination for pulmonary embolism, with segmental to subsegmental embolus present in the right upper lobe (series 4, image 37). Normal heart size. Three-vessel coronary artery calcifications. No pericardial effusion. Aortic atherosclerosis. Enlargement of the main pulmonary artery measuring up to 3.6 cm in caliber. RV LV ratio is preserved, approximately 0.6. Mediastinum/Nodes: Markedly enlarged subcarinal nodes measuring at least 6.1 x 2.6 cm (series 4, image 52) thyroid gland, trachea, and esophagus demonstrate no significant findings. Lungs/Pleura: Moderate right pleural effusion associated atelectasis or consolidation. There is a large mass centered in the right middle lobe, which occludes the middle lobe segmental bronchi, although difficult to distinguish from airspace consolidation and adjacent atelectasis, this measures approximately 7.9 x 7.7 cm (series 4, image 55). Upper Abdomen: No acute abnormality. Gallstones and or sludge in the dependent gallbladder. Musculoskeletal: No chest wall abnormality. No acute or significant osseous findings. Review of the MIP images confirms the above findings. IMPRESSION: 1. Positive examination for pulmonary embolism, with segmental to subsegmental embolus present in the right upper lobe. 2. Enlargement of the main pulmonary artery, concerning for pulmonary hypertension. No elevation of the RV LV ratio. 3. There is a large mass centered in the right middle lobe, which occludes the middle lobe segmental bronchi, although difficult to distinguish from airspace consolidation and adjacent atelectasis, this measures approximately 7.9 x 7.7 cm. 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 4. Markedly enlarged subcarinal lymph nodes, concerning for nodal metastatic disease. 5. Moderate right pleural effusion and associated atelectasis or consolidation, presumably malignant, however without direct evidence of pleural  metastatic disease. 6. Coronary artery disease. These results were called by telephone at the time of interpretation on 05/25/2021 at 11:29 am to Dr. Lennice Sites , who verbally acknowledged these results. Aortic Atherosclerosis (ICD10-I70.0). Electronically Signed   By: Eddie Candle M.D.   On: 05/25/2021 11:30   DG CHEST PORT 1 VIEW  Result Date: 05/27/2021 CLINICAL DATA:  Short of breath.  Right thoracentesis yesterday EXAM: PORTABLE CHEST 1 VIEW COMPARISON:  05/26/2021 FINDINGS:  Persistent density in the right lung base may represent neoplasm based on CT. Pneumonia possible. Small right effusion unchanged. No pneumothorax Left lung remains clear. IMPRESSION: Persistent airspace density in the right lung base may represent tumor or pneumonia. No pneumothorax. Electronically Signed   By: Franchot Gallo M.D.   On: 05/27/2021 08:19   DG Chest Portable 1 View  Result Date: 05/25/2021 CLINICAL DATA:  Breath for 1 week, hypertension, RIGHT pleural effusion by abdominal ultrasound EXAM: PORTABLE CHEST 1 VIEW COMPARISON:  Portable exam 0920 hours without priors for comparison FINDINGS: Enlargement of cardiac silhouette with pulmonary vascular congestion. Atherosclerotic calcification aorta. RIGHT pleural effusion and basilar atelectasis versus consolidation. Minimal atelectasis at LEFT base. Upper lungs clear. No pneumothorax. Bones demineralized. IMPRESSION: Enlargement of cardiac silhouette with pulmonary vascular congestion. RIGHT pleural effusion with basilar atelectasis and/or consolidation. Minimal LEFT basilar atelectasis. Electronically Signed   By: Lavonia Dana M.D.   On: 05/25/2021 10:03   ECHOCARDIOGRAM COMPLETE  Result Date: 05/26/2021    ECHOCARDIOGRAM REPORT   Patient Name:   ARTEMUS ROMANOFF Date of Exam: 05/26/2021 Medical Rec #:  [id number]       Height:       67.0 in Accession #:    [id number]      Weight:       262.0 lb Date of Birth:  07/29/1941       BSA:          2.268 m Patient Age:    23  years        BP:           144/80 mmHg Patient Gender: M               HR:           88 bpm. Exam Location:  Inpatient Procedure: 2D Echo, Cardiac Doppler and Color Doppler Indications:    Pulmonary embolus  History:        Patient has no prior history of Echocardiogram examinations.                 Signs/Symptoms:Pulmonary embolus; Risk Factors:Diabetes,                 Hypertension, Former Smoker and Obesity.  Sonographer:    Dustin Flock RDCS Referring Phys: [place]  1. Left ventricular ejection fraction, by estimation, is 60 to 65%. The left ventricle has normal function. Left ventricular endocardial border not optimally defined to evaluate regional wall motion. The left ventricular internal cavity size was mildly to moderately dilated. Left ventricular diastolic parameters are indeterminate.  2. Right ventricular systolic function is normal. The right ventricular size is normal.  3. The mitral valve is normal in structure. No evidence of mitral valve regurgitation.  4. The aortic valve is grossly normal. Aortic valve regurgitation is not visualized. FINDINGS  Left Ventricle: Left ventricular ejection fraction, by estimation, is 60 to 65%. The left ventricle has normal function. Left ventricular endocardial border not optimally defined to evaluate regional wall motion. The left ventricular internal cavity size was mildly to moderately dilated. There is no left ventricular hypertrophy. Left ventricular diastolic parameters are indeterminate. Right Ventricle: The right ventricular size is normal. Right vetricular wall thickness was not well visualized. Right ventricular systolic function is normal. Left Atrium: Left atrial size was normal in size. Right Atrium: Right atrial size was normal in size. Pericardium: There is no evidence of pericardial effusion. Mitral Valve: The mitral valve is normal in  structure. No evidence of mitral valve regurgitation. Tricuspid Valve: The tricuspid  valve is grossly normal. Tricuspid valve regurgitation is mild. Aortic Valve: The aortic valve is grossly normal. Aortic valve regurgitation is not visualized. Pulmonic Valve: The pulmonic valve was grossly normal. Pulmonic valve regurgitation is not visualized. Aorta: The aortic root and ascending aorta are structurally normal, with no evidence of dilitation. IAS/Shunts: The atrial septum is grossly normal.  LEFT VENTRICLE PLAX 2D LVIDd:         5.10 cm      Diastology LVIDs:         3.20 cm      LV e' medial:    6.96 cm/s LV PW:         1.20 cm      LV E/e' medial:  9.4 LV IVS:        1.00 cm      LV e' lateral:   6.09 cm/s LVOT diam:     2.40 cm      LV E/e' lateral: 10.7 LV SV:         79 LV SV Index:   35 LVOT Area:     4.52 cm  LV Volumes (MOD) LV vol d, MOD A4C: 157.0 ml LV vol s, MOD A4C: 66.8 ml LV SV MOD A4C:     157.0 ml RIGHT VENTRICLE RV Basal diam:  2.50 cm RV S prime:     16.80 cm/s TAPSE (M-mode): 2.4 cm LEFT ATRIUM             Index       RIGHT ATRIUM           Index LA diam:        3.10 cm 1.37 cm/m  RA Area:     13.80 cm LA Vol (A2C):   65.9 ml 29.05 ml/m RA Volume:   36.60 ml  16.14 ml/m LA Vol (A4C):   44.3 ml 19.53 ml/m LA Biplane Vol: 54.5 ml 24.03 ml/m  AORTIC VALVE LVOT Vmax:   110.00 cm/s LVOT Vmean:  64.400 cm/s LVOT VTI:    0.174 m  AORTA Ao Root diam: 2.90 cm MITRAL VALVE MV Area (PHT): 3.33 cm    SHUNTS MV Decel Time: 228 msec    Systemic VTI:  0.17 m MV E velocity: 65.10 cm/s  Systemic Diam: 2.40 cm MV A velocity: 78.40 cm/s MV E/A ratio:  0.83 Mertie Moores MD Electronically signed by Mertie Moores MD Signature Date/Time: 05/26/2021/1:17:37 PM    Final    VAS [place] LOWER EXTREMITY VENOUS (DVT)  Result Date: 05/27/2021  Lower Venous DVT Study Patient Name:  GEOVANNY SARTIN  Date of Exam:   05/27/2021 Medical Rec #: [id number]        Accession #:    [id number] Date of Birth: 02-Jan-1941        Patient Gender: M Patient Age:   31 years Exam Location[facility]  [facility] Procedure:       VAS [place] LOWER EXTREMITY VENOUS (DVT) Referring Phys: Brand Males --------------------------------------------------------------------------------  Indications: Pulmonary embolism. Other Indications: Incrasing shortness of breath x1 week. Comparison Study: No previous exams Performing Technologist: Jody Hill RVT, RDMS  Examination Guidelines: A complete evaluation includes B-mode imaging, spectral Doppler, color Doppler, and power Doppler as needed of all accessible portions of each vessel. Bilateral testing is considered an integral part of a complete examination. Limited examinations for reoccurring indications may be performed as noted. The reflux portion of the exam is  performed with the patient in reverse Trendelenburg.  +---------+---------------+---------+-----------+----------+--------------+ RIGHT    CompressibilityPhasicitySpontaneityPropertiesThrombus Aging +---------+---------------+---------+-----------+----------+--------------+ CFV      Full           Yes      Yes                                 +---------+---------------+---------+-----------+----------+--------------+ SFJ      Full                                                        +---------+---------------+---------+-----------+----------+--------------+ FV Prox  Full           Yes      Yes                                 +---------+---------------+---------+-----------+----------+--------------+ FV Mid   Full           Yes      Yes                                 +---------+---------------+---------+-----------+----------+--------------+ FV DistalFull           Yes      Yes                                 +---------+---------------+---------+-----------+----------+--------------+ PFV      Full                                                        +---------+---------------+---------+-----------+----------+--------------+ POP      Full           Yes      Yes                                  +---------+---------------+---------+-----------+----------+--------------+ PTV      Full                                                        +---------+---------------+---------+-----------+----------+--------------+ PERO     Full                                                        +---------+---------------+---------+-----------+----------+--------------+   +---------+---------------+---------+-----------+----------+--------------+ LEFT     CompressibilityPhasicitySpontaneityPropertiesThrombus Aging +---------+---------------+---------+-----------+----------+--------------+ CFV      Full           Yes      Yes                                 +---------+---------------+---------+-----------+----------+--------------+  SFJ      Full                                                        +---------+---------------+---------+-----------+----------+--------------+ FV Prox  Full           Yes      Yes                                 +---------+---------------+---------+-----------+----------+--------------+ FV Mid   Full           Yes      Yes                                 +---------+---------------+---------+-----------+----------+--------------+ FV DistalFull           Yes      Yes                                 +---------+---------------+---------+-----------+----------+--------------+ PFV      Full                                                        +---------+---------------+---------+-----------+----------+--------------+ POP      Full           Yes      Yes                                 +---------+---------------+---------+-----------+----------+--------------+ PTV      Full                                                        +---------+---------------+---------+-----------+----------+--------------+ PERO     None           No       No                   Acute           +---------+---------------+---------+-----------+----------+--------------+     Summary: BILATERAL: - No evidence of superficial venous thrombosis in the lower extremities, bilaterally. -No evidence of popliteal cyst, bilaterally. RIGHT: - There is no evidence of deep vein thrombosis in the lower extremity.  LEFT: - Findings consistent with acute deep vein thrombosis involving the left peroneal veins.  *See table(s) above for measurements and observations. Electronically signed by Jamelle Haring on 05/27/2021 at 5:52:25 PM.    Final    US THORACENTESIS ASP PLEURAL SPACE W/IMG GUIDE  Result Date: 05/26/2021 INDICATION: Patient with history of right lung mass, pulmonary embolus, dyspnea, right pleural effusion. Request received for diagnostic and therapeutic right thoracentesis. EXAM: ULTRASOUND GUIDED DIAGNOSTIC AND THERAPEUTIC RIGHT THORACENTESIS MEDICATIONS: 1% lidocaine to skin and subcutaneous tissue COMPLICATIONS: None immediate. PROCEDURE: An ultrasound guided thoracentesis was thoroughly discussed with  the patient and questions answered. The benefits, risks, alternatives and complications were also discussed. The patient understands and wishes to proceed with the procedure. Written consent was obtained. Ultrasound was performed to localize and mark an adequate pocket of fluid in the right chest. The area was then prepped and draped in the normal sterile fashion. 1% Lidocaine was used for local anesthesia. Under ultrasound guidance a 6 Fr Safe-T-Centesis catheter was introduced. Thoracentesis was performed. The catheter was removed and a dressing applied. FINDINGS: A total of approximately 1.3 liters of blood-tinged fluid was removed. Samples were sent to the laboratory as requested by the clinical team. IMPRESSION: Successful ultrasound guided diagnostic and therapeutic right thoracentesis yielding 1.3 liters of pleural fluid. Read by: Rowe Robert, PA-C Electronically Signed   By: Markus Daft M.D.   On:  05/26/2021 15:10     Subjective: Seen and examined at bedside and is doing relatively well.  Desaturated and does need supplemental oxygen at home and he will follow-up with pulmonary within 1 to 2 weeks.  Denies any lightheadedness or dizziness.  Feels better and still has some GERD.  No other concerns or complaints this time.  Discharge Exam: Vitals:   05/27/21 1246 05/27/21 1355  BP: 119/72   Pulse: 97   Resp: 20   Temp: 99.1 F (37.3 C)   SpO2: 93% 94%   Vitals:   05/27/21 0733 05/27/21 0739 05/27/21 1246 05/27/21 1355  BP:   119/72   Pulse:   97   Resp:   20   Temp:   99.1 F (37.3 C)   TempSrc:   Oral   SpO2: 96% 97% 93% 94%  Weight:      Height:       General: Pt is alert, awake, not in acute distress Cardiovascular: RRR, S1/S2 +, no rubs, no gallops Respiratory: Diminished bilaterally, no wheezing, no rhonchi; wearing supplemental oxygen via nasal cannula Abdominal: Soft, NT, distended secondary habitus, bowel sounds + Extremities: 1+ edema, no cyanosis  The results of significant diagnostics from this hospitalization (including imaging, microbiology, ancillary and laboratory) are listed below for reference.    Microbiology: Recent Results (from the past 240 hour(s))  Resp Panel by RT-PCR (Flu A&B, Covid) Nasopharyngeal Swab     Status: None   Collection Time: 05/25/21  9:47 AM   Specimen: Nasopharyngeal Swab; Nasopharyngeal(NP) swabs in vial transport medium  Result Value Ref Range Status   SARS Coronavirus 2 by RT PCR NEGATIVE NEGATIVE Final    Comment: (NOTE) SARS-CoV-2 target nucleic acids are NOT DETECTED.  The SARS-CoV-2 RNA is generally detectable in upper respiratory specimens during the acute phase of infection. The lowest concentration of SARS-CoV-2 viral copies this assay can detect is 138 copies/mL. A negative result does not preclude SARS-Cov-2 infection and should not be used as the sole basis for treatment or other patient management decisions.  A negative result may occur with  improper specimen collection/handling, submission of specimen other than nasopharyngeal swab, presence of viral mutation(s) within the areas targeted by this assay, and inadequate number of viral copies(<138 copies/mL). A negative result must be combined with clinical observations, patient history, and epidemiological information. The expected result is Negative.  Fact Sheet for Patients:  EntrepreneurPulse.com.au  Fact Sheet for Healthcare Providers:  IncredibleEmployment.be  This test is no t yet approved or cleared by the [place] FDA and  has been authorized for detection and/or diagnosis of SARS-CoV-2 by FDA under an Emergency Use Authorization (EUA). This EUA will  remain  in effect (meaning this test can be used) for the duration of the COVID-19 declaration under Section 564(b)(1) of the Act, 21 U.S.C.section 360bbb-3(b)(1), unless the authorization is terminated  or revoked sooner.       Influenza A by PCR NEGATIVE NEGATIVE Final   Influenza B by PCR NEGATIVE NEGATIVE Final    Comment: (NOTE) The Xpert Xpress SARS-CoV-2/FLU/RSV plus assay is intended as an aid in the diagnosis of influenza from Nasopharyngeal swab specimens and should not be used as a sole basis for treatment. Nasal washings and aspirates are unacceptable for Xpert Xpress SARS-CoV-2/FLU/RSV testing.  Fact Sheet for Patients: EntrepreneurPulse.com.au  Fact Sheet for Healthcare Providers: IncredibleEmployment.be  This test is not yet approved or cleared by the [place] FDA and has been authorized for detection and/or diagnosis of SARS-CoV-2 by FDA under an Emergency Use Authorization (EUA). This EUA will remain in effect (meaning this test can be used) for the duration of the COVID-19 declaration under Section 564(b)(1) of the Act, 21 U.S.C. section 360bbb-3(b)(1), unless the authorization  is terminated or revoked.  Performed at [facility], [address] [place], [place] [address]   Body fluid culture w Gram Stain     Status: None (Preliminary result)   Collection Time: 05/26/21  3:31 PM   Specimen: PATH Cytology Pleural fluid  Result Value Ref Range Status   Specimen Description   Final    PLEURAL Performed at [facility], [place] [address], [place], [place] [address]    Special Requests   Final    NONE Performed at [facility], [place] [address] [place], [place] [address]    Gram Stain   Final    NO SQUAMOUS EPITHELIAL CELLS SEEN FEW WBC PRESENT,BOTH PMN AND MONONUCLEAR NO ORGANISMS SEEN    Culture   Final    NO GROWTH < 12 HOURS Performed at [facility], [place] [address], [place], [place] [address]    Report Status PENDING  Incomplete    Labs: BNP (last 3 results) Recent Labs    05/25/21 0947  BNP 28.7   Basic Metabolic Panel: Recent Labs  Lab 05/25/21 0947 05/25/21 2016 05/26/21 0502 05/27/21 0034  NA 137  --  141 140  K 4.5  --  4.2 4.3  CL 105  --  111 110  CO2 19*  --  18* 20*  GLUCOSE 106*  --  105* 150*  BUN 35*  --  31* 33*  CREATININE 1.83* 1.58* 1.48* 1.58*  CALCIUM 8.8*  --  8.9 8.9  MG  --   --   --  2.3  PHOS  --   --   --  4.6   Liver Function Tests: Recent Labs  Lab 05/25/21 0947 05/26/21 0502 05/27/21 0034  AST 43* 62* 62*  ALT 27 28 30   ALKPHOS 120 125 116  BILITOT 1.1 1.0 1.2  PROT 6.8 7.1 7.0  ALBUMIN 2.3* 2.5* 2.3*   No results for input(s): LIPASE, AMYLASE in the last 168 hours. No results for input(s): AMMONIA in the last 168 hours. CBC: Recent Labs  Lab 05/25/21 0947 05/25/21 2016 05/26/21 0502 05/27/21 0034  WBC 10.5 9.7 9.4 10.0  NEUTROABS 7.3  --   --  6.8  HGB 12.0* 11.4* 11.9* 11.1*  HCT 36.2* 35.7* 37.1* 34.7*  MCV 86.6 88.4 88.1 87.6  PLT 472* 495* 488* 501*   Cardiac Enzymes: No results for input(s): CKTOTAL, CKMB,  CKMBINDEX,  TROPONINI in the last 168 hours. BNP: Invalid input(s): POCBNP CBG: Recent Labs  Lab 05/26/21 0714 05/26/21 1657 05/26/21 2105 05/27/21 0728 05/27/21 1242  GLUCAP 108* 104* 138* 123* 132*   D-Dimer No results for input(s): DDIMER in the last 72 hours. Hgb A1c Recent Labs    05/25/21 1806  HGBA1C 6.8*   Lipid Profile No results for input(s): CHOL, HDL, LDLCALC, TRIG, CHOLHDL, LDLDIRECT in the last 72 hours. Thyroid function studies No results for input(s): TSH, T4TOTAL, T3FREE, THYROIDAB in the last 72 hours.  Invalid input(s): FREET3 Anemia work up No results for input(s): VITAMINB12, FOLATE, FERRITIN, TIBC, IRON, RETICCTPCT in the last 72 hours. Urinalysis No results found for: COLORURINE, APPEARANCEUR, [place], [place], GLUCOSEU, [place], [place], [place], PROTEINUR, UROBILINOGEN, NITRITE, LEUKOCYTESUR Sepsis Labs Invalid input(s): PROCALCITONIN,  WBC,  LACTICIDVEN Microbiology Recent Results (from the past 240 hour(s))  Resp Panel by RT-PCR (Flu A&B, Covid) Nasopharyngeal Swab     Status: None   Collection Time: 05/25/21  9:47 AM   Specimen: Nasopharyngeal Swab; Nasopharyngeal(NP) swabs in vial transport medium  Result Value Ref Range Status   SARS Coronavirus 2 by RT PCR NEGATIVE NEGATIVE Final    Comment: (NOTE) SARS-CoV-2 target nucleic acids are NOT DETECTED.  The SARS-CoV-2 RNA is generally detectable in upper respiratory specimens during the acute phase of infection. The lowest concentration of SARS-CoV-2 viral copies this assay can detect is 138 copies/mL. A negative result does not preclude SARS-Cov-2 infection and should not be used as the sole basis for treatment or other patient management decisions. A negative result may occur with  improper specimen collection/handling, submission of specimen other than nasopharyngeal swab, presence of viral mutation(s) within the areas targeted by this assay, and inadequate number of viral copies(<138  copies/mL). A negative result must be combined with clinical observations, patient history, and epidemiological information. The expected result is Negative.  Fact Sheet for Patients:  EntrepreneurPulse.com.au  Fact Sheet for Healthcare Providers:  IncredibleEmployment.be  This test is no t yet approved or cleared by the [place] FDA and  has been authorized for detection and/or diagnosis of SARS-CoV-2 by FDA under an Emergency Use Authorization (EUA). This EUA will remain  in effect (meaning this test can be used) for the duration of the COVID-19 declaration under Section 564(b)(1) of the Act, 21 U.S.C.section 360bbb-3(b)(1), unless the authorization is terminated  or revoked sooner.       Influenza A by PCR NEGATIVE NEGATIVE Final   Influenza B by PCR NEGATIVE NEGATIVE Final    Comment: (NOTE) The Xpert Xpress SARS-CoV-2/FLU/RSV plus assay is intended as an aid in the diagnosis of influenza from Nasopharyngeal swab specimens and should not be used as a sole basis for treatment. Nasal washings and aspirates are unacceptable for Xpert Xpress SARS-CoV-2/FLU/RSV testing.  Fact Sheet for Patients: EntrepreneurPulse.com.au  Fact Sheet for Healthcare Providers: IncredibleEmployment.be  This test is not yet approved or cleared by the [place] FDA and has been authorized for detection and/or diagnosis of SARS-CoV-2 by FDA under an Emergency Use Authorization (EUA). This EUA will remain in effect (meaning this test can be used) for the duration of the COVID-19 declaration under Section 564(b)(1) of the Act, 21 U.S.C. section 360bbb-3(b)(1), unless the authorization is terminated or revoked.  Performed at [facility], [address] [place], [place] [address]   Body fluid culture w Gram Stain     Status: None (Preliminary result)   Collection Time: 05/26/21  3:31 PM  Specimen: PATH  Cytology Pleural fluid  Result Value Ref Range Status   Specimen Description   Final    PLEURAL Performed at [place] [address], [place], [place] [address]    Special Requests   Final    NONE Performed at [facility], [place] [address], [place], [place] [address]    Gram Stain   Final    NO SQUAMOUS EPITHELIAL CELLS SEEN FEW WBC PRESENT,BOTH PMN AND MONONUCLEAR NO ORGANISMS SEEN    Culture   Final    NO GROWTH < 12 HOURS Performed at [facility], [place] [address] [place], [place] [address]    Report Status PENDING  Incomplete   Time coordinating discharge: 35 minutes  SIGNED:  Kerney Elbe, DO [facility] 05/27/2021, 7:38 PM Pager is on [place]  If 7PM-7AM, please contact night-coverage [email or web address]

## 2021-05-27 NOTE — TOC Transition Note (Signed)
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 No further CM needs.     Final next level of care: Home/Self Care Barriers to Discharge: No Barriers Identified   Patient Goals and CMS Choice Patient states their goals for this hospitalization and ongoing recovery are:: go home CMS Medicare.gov Compare Post Acute Care list provided to:: Patient Represenative (must comment) Kevin Hobbs spouse [phone]) Choice offered to / list presented to : Spouse  Discharge Placement                       Discharge Plan and Services   Discharge Planning Services: CM Consult Post Acute Care Choice: Durable Medical Equipment          DME Arranged: Oxygen DME Agency: [employer] Date DME Agency Contacted: 05/27/21 Time DME Agency Contacted: 1150 Representative spoke with at DME Agency: [place]) Interventions     Readmission Risk Interventions No flowsheet data found.

## 2021-05-27 NOTE — Progress Notes (Signed)
ANTICOAGULATION CONSULT NOTE - Follow Up Consult  Pharmacy Consult for heparin >> apixaban Indication: acute pulmonary embolus  No Known Allergies  Patient Measurements: Height: 5\' 7"  (170.2 cm) Weight: 118.8 kg (262 lb) IBW/kg (Calculated) : 66.1 Heparin Dosing Weight: 93 kg  Vital Signs: Temp: 99.1 F (37.3 C) (08/25 0534) Temp Source: Oral (08/25 0534) BP: 133/78 (08/25 0534) Pulse Rate: 87 (08/25 0534)  Labs: Recent Labs    05/25/21 0947 05/25/21 0947 05/25/21 0948 05/25/21 1628 05/25/21 2016 05/26/21 0502 05/26/21 1411 05/27/21 0034 05/27/21 0931  HGB 12.0*  --   --   --  11.4* 11.9*  --  11.1*  --   HCT 36.2*  --   --   --  35.7* 37.1*  --  34.7*  --   PLT 472*  --   --   --  495* 488*  --  501*  --   APTT  --   --  31  --   --   --   --   --   --   LABPROT  --   --  16.0*  --   --   --   --   --   --   INR  --   --  1.3*  --   --   --   --   --   --   HEPARINUNFRC  --    < >  --  0.19* 0.41 0.13* <0.10* 0.22* 0.28*  CREATININE 1.83*  --   --   --  1.58* 1.48*  --  1.58*  --   TROPONINIHS 9  --   --  7  --  11  --   --   --    < > = values in this interval not displayed.     Estimated Creatinine Clearance: 47.5 mL/min (A) (by C-G formula based on SCr of 1.58 mg/dL (H)).    Assessment: Patient is a 80 y.o M presented to the ED on 05/25/21 with c/o SOB. Chest CT showed acute PE, right pleural effusion and lung lung mass. He's currently on heparin drip for VTE treatment. Per CCM, ok to transition to apixaban today.  Today, 05/27/2021: - HL 0.28 subtherapeutic on 2200 units/hr - CBC: Hgb low but stable, Plts elevated - no bleeding or interruptions per RN - SCr 1.58, CrCl ~64 ml/min - No drug interactions noted  Goal of Therapy:  Heparin level 0.3-0.7 units/ml Monitor platelets by anticoagulation protocol: Yes   Plan:  - D/c heparin - Begin apixaban 10mg  PO bid x 7 days, then 5mg  PO bid thereafter - Provide 30 day free coupon voucher and education  prior to discharge  Peggyann Juba, PharmD, [place]: [phone]  05/27/2021, 10:27 AM

## 2021-05-27 NOTE — Progress Notes (Signed)
Discharge instructions given to patient, wife, and daughter. They all understood and had no further questions.

## 2021-05-27 NOTE — Plan of Care (Signed)

## 2021-05-27 NOTE — Progress Notes (Signed)
SATURATION QUALIFICATIONS: (This note is used to comply with regulatory documentation for home oxygen)  Patient Saturations on Room Air at Rest = 88%  Patient Saturations on Room Air while Ambulating = 82%  Patient Saturations on 2 Liters of oxygen while Ambulating = 91%  Please briefly explain why patient needs home oxygen: Patient SOB with activity, oxygen saturation down to low 80s on room air on 1L of oxygen still in the mid 80s on 2L sats up to 90%/91%, Incentive spirometry given and encouraged.

## 2021-05-27 NOTE — Plan of Care (Signed)
  Problem: Education: Goal: Knowledge of General Education information will improve Description: Including pain rating scale, medication(s)/side effects and non-pharmacologic comfort measures Outcome: Progressing   Problem: Health Behavior/Discharge Planning: Goal: Ability to manage health-related needs will improve Outcome: Progressing   Problem: Activity: Goal: Risk for activity intolerance will decrease Outcome: Progressing   Problem: Pain Managment: Goal: General experience of comfort will improve Outcome: Progressing   Problem: Skin Integrity: Goal: Risk for impaired skin integrity will decrease Outcome: Progressing   Problem: Nutrition: Goal: Adequate nutrition will be maintained Outcome: Adequate for Discharge

## 2021-05-27 NOTE — Discharge Instructions (Addendum)
Information on my medicine - ELIQUIS (apixaban)  Why was Eliquis prescribed for you? Eliquis was prescribed to treat blood clots that may have been found in the veins of your legs (deep vein thrombosis) or in your lungs (pulmonary embolism) and to reduce the risk of them occurring again.  What do You need to know about Eliquis ? The starting dose is 10 mg (two 5 mg tablets) taken TWICE daily for the FIRST SEVEN (7) DAYS, then on September 1st  the dose is reduced to ONE 5 mg tablet taken TWICE daily.  Eliquis may be taken with or without food.   Try to take the dose about the same time in the morning and in the evening. If you have difficulty swallowing the tablet whole please discuss with your pharmacist how to take the medication safely.  Take Eliquis exactly as prescribed and DO NOT stop taking Eliquis without talking to the doctor who prescribed the medication.  Stopping may increase your risk of developing a new blood clot.  Refill your prescription before you run out.  After discharge, you should have regular check-up appointments with your healthcare provider that is prescribing your Eliquis.    What do you do if you miss a dose? If a dose of ELIQUIS is not taken at the scheduled time, take it as soon as possible on the same day and twice-daily administration should be resumed. The dose should not be doubled to make up for a missed dose.  Important Safety Information A possible side effect of Eliquis is bleeding. You should call your healthcare provider right away if you experience any of the following: Bleeding from an injury or your nose that does not stop. Unusual colored urine (red or dark brown) or unusual colored stools (red or black). Unusual bruising for unknown reasons. A serious fall or if you hit your head (even if there is no bleeding).  Some medicines may interact with Eliquis and might increase your risk of bleeding or clotting while on Eliquis. To help avoid  this, consult your healthcare provider or pharmacist prior to using any new prescription or non-prescription medications, including herbals, vitamins, non-steroidal anti-inflammatory drugs (NSAIDs) and supplements.  This website has more information on Eliquis (apixaban): http://www.eliquis.com/eliquis/home

## 2021-05-27 NOTE — Progress Notes (Signed)
BLE venous duplex has been completed.  Dr. Chase Caller messaged with preliminary findings.  Results can be found under chart review under CV PROC. 05/27/2021 1:49 PM Lakisha Peyser RVT, RDMS

## 2021-05-27 NOTE — Telephone Encounter (Signed)
I called and spoke with patient wife, who is on DPR, and went over Dr. Oliver Hum. Wife verbalized understanding and we cancelled appt with Beth on 09/02 and keeping appt with Dr. Valeta Harms on 06/09/21. Order is already in for PET scan and notified wife someone will be calling to schedule that. Nothing further needed.

## 2021-05-27 NOTE — Progress Notes (Signed)
ANTICOAGULATION CONSULT NOTE - Follow Up Consult  Pharmacy Consult for heparin Indication: acute pulmonary embolus  No Known Allergies  Patient Measurements: Height: 5\' 7"  (170.2 cm) Weight: 118.8 kg (262 lb) IBW/kg (Calculated) : 66.1 Heparin Dosing Weight: 93 kg  Vital Signs: Temp: 99.9 F (37.7 C) (08/24 2220) Temp Source: Oral (08/24 2220) BP: 137/60 (08/24 2220) Pulse Rate: 95 (08/24 2226)  Labs: Recent Labs    05/25/21 0947 05/25/21 0947 05/25/21 0948 05/25/21 1628 05/25/21 2016 05/26/21 0502 05/26/21 1411 05/27/21 0034  HGB 12.0*  --   --   --  11.4* 11.9*  --  11.1*  HCT 36.2*  --   --   --  35.7* 37.1*  --  34.7*  PLT 472*  --   --   --  495* 488*  --  501*  APTT  --   --  31  --   --   --   --   --   LABPROT  --   --  16.0*  --   --   --   --   --   INR  --   --  1.3*  --   --   --   --   --   HEPARINUNFRC  --    < >  --  0.19* 0.41 0.13* <0.10* 0.22*  CREATININE 1.83*  --   --   --  1.58* 1.48*  --  1.58*  TROPONINIHS 9  --   --  7  --  11  --   --    < > = values in this interval not displayed.     Estimated Creatinine Clearance: 47.5 mL/min (A) (by C-G formula based on SCr of 1.58 mg/dL (H)).    Assessment: Patient is a 80 y.o M presented to the ED on 05/25/21 with c/o SOB. Chest CT showed acute PE, right pleural effusion and lung lung mass. He's currently on heparin drip for VTE treatment.  Today, 05/27/2021: - HL 0.22 subtherapeutic on 2000 units/hr - no bleeding or interruptions per RN  Goal of Therapy:  Heparin level 0.3-0.7 units/ml Monitor platelets by anticoagulation protocol: Yes   Plan:  - increase heparin drip to 2200 units/hr - check 8 hr heparin level - monitor for s/sx bleeding  Dolly Rias RPh 05/27/2021, 1:42 AM

## 2021-05-27 NOTE — Consult Note (Signed)
NAME:  Kevin Hobbs, MRN:  [id number], DOB:  [birth year], LOS: 2 ADMISSION DATE:  05/25/2021, CONSULTATION DATE:  05/25/21 REFERRING MD:  Dwyane Dee CHIEF COMPLAINT:  Lung Mass, PE   BRIEF  Kevin Hobbs is a 80 y.o. male who presented to [facility] 8/23 with complaints 1 month and then right-sided pleuritic chest pain x1 or 2 weeks followed by insidious onset and progressive dyspnea x 1 week.  As the dyspnea got worse chest pain got better.  Saw PCP that morning and was told to go to ED. In ED, he was hypoxic with exertion but stable at rest.  COVID negative. CXR showed left effusion.  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 He was admitted by [facility] and PCCM asked to see in consultation.  The CT scan was personally visualized by PCCM heparin infusion.  At admission biomarkers for severity of pulm embolism suggested to mild pulm embolism based on the fact on CT scan there is no RV LV strain [all the pulmonary arteries are enlarged suggesting hypertension is present], BNP is normal, troponin is normal.  He is on room air.  On basis of age, male gender and history of cancer even though his hemodynamics are normal we have CLass 4 PESI score of 118 points.  [place].  He quit smoking in 1996 also.  Wife also used to smoke.  She also quit within a year of him quitting.  Has OSA - on cPAP on RA at home  Pertinent  Medical History:  has Pulmonary embolism ([place]) and Pulmonary emboli ([facility]) on their problem list.   has a past medical history of Diabetes mellitus without complication ([place]) and Hypertension.   reports that he has quit smoking. His smoking use included cigarettes. He has never used smokeless tobacco.  Past Surgical History:  Procedure Laterality Date   HERNIA REPAIR      No Known Allergies   There is  no immunization history on file for this patient.  History reviewed. No pertinent family history.   Current Facility-Administered Medications:    0.9 %  sodium chloride infusion, , Intravenous, Continuous, Darrick Meigs, Marge Duncans, MD, Last Rate: 10 mL/hr at 05/25/21 2125, New Bag at 05/25/21 2125   acetaminophen (TYLENOL) tablet 650 mg, 650 mg, Oral, Q6H PRN, 650 mg at 05/26/21 2206 **OR** acetaminophen (TYLENOL) suppository 650 mg, 650 mg, Rectal, Q6H PRN, Darrick Meigs, Marge Duncans, MD   albuterol (VENTOLIN HFA) 108 (90 Base) MCG/ACT inhaler 2 puff, 2 puff, Inhalation, Q6H PRN, Chase Caller, Maranatha Grossi, MD   chlorhexidine (PERIDEX) 0.12 % solution 15 mL, 15 mL, Mouth Rinse, BID, Darrick Meigs, Gagan S, MD, 15 mL at 05/26/21 2205   fluticasone furoate-vilanterol (BREO ELLIPTA) 100-25 MCG/INH 1 puff, 1 puff, Inhalation, Daily, Etai Copado, MD   guaiFENesin (MUCINEX) 12 hr tablet 600 mg, 600 mg, Oral, BID, Darrick Meigs, Gagan S, MD, 600 mg at 05/26/21 2206   heparin ADULT infusion 100 units/mL (25000 units/254mL), 2,200 Units/hr, Intravenous, Continuous, Angela Adam, RPH, Last Rate: 22 mL/hr at 05/27/21 0531, 2,200 Units/hr at 05/27/21 0531   insulin aspart (novoLOG) injection 0-9 Units, 0-9 Units, Subcutaneous, TID WC, Lama, Marge Duncans, MD   MEDLINE mouth rinse, 15 mL, Mouth Rinse, q12n4p, Lama, Marge Duncans, MD   ondansetron (ZOFRAN) tablet 4 mg, 4 mg, Oral, Q6H PRN **OR** ondansetron (ZOFRAN) injection  4 mg, 4 mg, Intravenous, Q6H PRN, Darrick Meigs, Marge Duncans, MD   pantoprazole (PROTONIX) EC tablet 40 mg, 40 mg, Oral, Daily, Darrick Meigs, Gagan S, MD, 40 mg at 05/26/21 0956   simvastatin (ZOCOR) tablet 10 mg, 10 mg, Oral, Daily, Darrick Meigs, Gagan S, MD, 10 mg at 05/26/21 0956   sodium chloride (OCEAN) 0.65 % nasal spray 1 spray, 1 spray, Nasal, QHS, Darrick Meigs, Marge Duncans, MD, 1 spray at 05/26/21 2206   traMADol (ULTRAM) tablet 50 mg, 50 mg, Oral, Q6H PRN, Kathryne Eriksson, NP, 50 mg at 05/26/21 2004   umeclidinium bromide (INCRUSE ELLIPTA) 62.5 MCG/INH 1 puff, 1 puff,  Inhalation, Daily, Jamila Slatten, MD   Significant Hospital Events: Including procedures, antibiotic start and stop dates in addition to other pertinent events   8/23 admit. STart Iv heparin gtt, Small PE. Start nebs for wheezing 8/24 - Thora - exudate LDH 600s cyctology pendiong  Interim History / Subjective:   8/25 - using cPAP QHS for baseline copd. Also on 2LNC 97%. Thora yesterday - exduate cytology pending. On IV heparin gtt for PE (load small -trop, lactate, BNP and RV on echo all normal). On BD regimen via nebs -. Now wheezing resolved and les dsypenic after above. Wife at beside  Objective:  Blood pressure 133/78, pulse 87, temperature 99.1 F (37.3 C), temperature source Oral, resp. rate 20, height 5\' 7"  (1.702 m), weight 118.8 kg, SpO2 97 %.    FiO2 (%):  [28 %] 28 %   Intake/Output Summary (Last 24 hours) at 05/27/2021 0918 Last data filed at 05/27/2021 0830 Gross per 24 hour  Intake 680.54 ml  Output 1000 ml  Net -319.46 ml   Filed Weights   05/25/21 0919  Weight: 118.8 kg    Examination: General Appearance:  Looks well. Obese Head:  Normocephalic, without obvious abnormality, atraumatic Eyes:  PERRL - yes, conjunctiva/corneas - muddy     Ears:  Normal external ear canals, both ears Nose:  G tube - no but has [place] o2 2L Throat:  ETT TUBE - no , OG tube - no Neck:  Supple,  No enlargement/tenderness/nodules Lungs: Clear to auscultation bilaterally, Heart:  S1 and S2 normal, no murmur, CVP - no.  Pressors - no Abdomen:  Soft, no masses, no organomegaly Genitalia / Rectal:  Not done Extremities:  Extremities- intac Skin:  ntact in exposed areas . Sacral area - x Neurologic:  Sedation - none -> RASS - +1 . Moves all 4s - yes. CAM-ICU - neg . 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  Assessment & Plan:  Baseline OSA on cPAP on RA -  Plan  - continue cPAP QHS   RUL PE - Present on admission- likely provoked in setting of probable undiagnosed lung CA. class IV pulmonary embolism severity index score 118 point.  Biomarkers and normal RV on echo suggest - PE load is small   05/27/2021 - normal lactate, trop, bno and RV on echo. On IV heparin gtt x 48h without bleed.   Vance to start eliquis if no CI -pharm consult placed  - continue as long as cancer present or 1-2 years - No role in lytics  - Assess LE duplex,.    Large RML mass with enlarged subcarinal lymph nodes  and mod R effusion.  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  05/27/2021 - s/p thora 8/24 exudate and cytologp pending  Plan  - Outpatient PET scan -Further biopsy based on PET scan results and cytology results - given PE not a good time to do bx this admit - OPD appt as below (PET scan to be arranged as opd)_   Bilateral wheezing at admission - likely copd  05/27/2021- resolved after nebs and thora  plan - change nebs to incruse + breo + alb prn - needs this as opd   Acute hypoxemic resp failure - mild at admit  05/27/2021 - still on 2L [place] opulse ox 97%  Plan  - turn o2 off and walk him to see if he really needs o2  - goal pulse ox > 92%   Followup Future Appointments  Date Time Provider [place]  06/04/2021 11:00 AM Martyn Ehrich, NP [facility] None  06/09/2021  9:30 AM Icard, Octavio Graves, DO [facility] None   Patient and wife counseled of all of above Ccm will sign off - informed Dr Alfredia Ferguson via secure chart   Best practice (evaluated daily):  Per primary team.     SIGNATURE    Dr. Brand Males, M.D., F.C.C.P,  Pulmonary and Critical Care Medicine Staff Physician, [place] Director - Interstitial Lung Disease  Program  [facility] [place] [facility] [place], [place], [address]  NPI Number:  NPI #[id number]  Pager: [phone], If no answer  -> Check AMION or Try [phone] Telephone (clinical office): [phone] Telephone (research): [phone]  9:18 AM 05/27/2021    LABS    PULMONARY No results for input(s): PHART, PCO2ART, PO2ART, HCO3, TCO2, O2SAT in the last 168 hours.  Invalid input(s): PCO2, PO2  CBC Recent Labs  Lab 05/25/21 2016 05/26/21 0502 05/27/21 0034  HGB 11.4* 11.9* 11.1*  HCT 35.7* 37.1* 34.7*  WBC 9.7 9.4 10.0  PLT 495* 488* 501*    COAGULATION Recent Labs  Lab 05/25/21 0948  INR 1.3*    CARDIAC  No results for input(s): TROPONINI in the last 168 hours. No results for input(s): PROBNP in the last 168 hours.   CHEMISTRY Recent Labs  Lab 05/25/21 0947 05/25/21 2016 05/26/21 0502 05/27/21 0034  NA 137  --  141 140  K 4.5  --  4.2 4.3  CL 105  --  111 110  CO2 19*  --  18* 20*  GLUCOSE 106*  --  105* 150*  BUN 35*  --  31* 33*  CREATININE 1.83* 1.58* 1.48* 1.58*  CALCIUM 8.8*  --  8.9 8.9  MG  --   --   --  2.3  PHOS  --   --   --  4.6   Estimated Creatinine Clearance: 47.5 mL/min (A) (by C-G formula based on SCr of 1.58 mg/dL (H)).   LIVER Recent Labs  Lab 05/25/21 0947 05/25/21 0948 05/26/21 0502 05/27/21 0034  AST 43*  --  62* 62*  ALT 27  --  28 30  ALKPHOS 120  --  125 116  BILITOT 1.1  --  1.0 1.2  PROT 6.8  --  7.1 7.0  ALBUMIN 2.3*  --  2.5* 2.3*  INR  --  1.3*  --   --      INFECTIOUS Recent Labs  Lab 05/26/21 0502  LATICACIDVEN 1.1     ENDOCRINE  CBG (last 3)  Recent Labs    05/26/21 1657 05/26/21 2105 05/27/21 0728  GLUCAP 104* 138* 123*         IMAGING x48h  - image(s) personally visualized  -   highlighted in bold DG Chest 1 View  Result Date: 05/26/2021 CLINICAL DATA:  Status post right thoracentesis. EXAM: CHEST  1 VIEW COMPARISON:  One-view chest x-ray 05/25/2021 FINDINGS: Heart is enlarged. Right  pleural effusion is significantly decreased. No pneumothorax is present. Left lung is clear. Pulmonary vascular congestion noted. IMPRESSION: 1. Right pleural effusion without evidence for complication. 2. Pulmonary vascular congestion. Electronically Signed   By: San Morelle M.D.   On: 05/26/2021 15:36   CT Angio Chest PE W and/or Wo Contrast  Result Date: 05/25/2021 CLINICAL DATA:  Shortness of breath EXAM: CT ANGIOGRAPHY CHEST WITH CONTRAST TECHNIQUE: Multidetector CT imaging of the chest was performed using the standard protocol during bolus administration of intravenous contrast. Multiplanar CT image reconstructions and MIPs were obtained to evaluate the vascular anatomy. CONTRAST:  17mL OMNIPAQUE IOHEXOL 350 MG/ML SOLN COMPARISON:  None. FINDINGS: Cardiovascular: Examination for pulmonary embolism is limited by marginal contrast bolus, main pulmonary artery = 141 HU. Within this limitation, positive examination for pulmonary embolism, with segmental to subsegmental embolus present in the right upper lobe (series 4, image 37). Normal heart size. Three-vessel coronary artery calcifications. No pericardial effusion. Aortic atherosclerosis. Enlargement of the main pulmonary artery measuring up to 3.6 cm in caliber. RV LV ratio is preserved, approximately 0.6. Mediastinum/Nodes: Markedly enlarged subcarinal nodes measuring at least 6.1 x 2.6 cm (series 4, image 52) thyroid gland, trachea, and esophagus demonstrate no significant findings. Lungs/Pleura: Moderate right pleural effusion associated atelectasis or consolidation. There is a large mass centered in the right middle lobe, which occludes the middle lobe segmental bronchi, although difficult to distinguish from airspace consolidation and adjacent atelectasis, this measures approximately 7.9 x 7.7 cm (series 4, image 55). Upper Abdomen: No acute abnormality. Gallstones and or sludge in the dependent gallbladder. Musculoskeletal: No chest wall  abnormality. No acute or significant osseous findings. Review of the MIP images confirms the above findings. IMPRESSION: 1. Positive examination for pulmonary embolism, with segmental to subsegmental embolus present in the right upper lobe. 2. Enlargement of the main pulmonary artery, concerning for pulmonary hypertension. No elevation of the RV LV ratio. 3. There is a large mass centered in the right middle lobe, which occludes the middle lobe segmental bronchi, although difficult to distinguish from airspace consolidation and adjacent atelectasis, this measures approximately 7.9 x 7.7 cm. 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 4. Markedly enlarged subcarinal lymph nodes, concerning for nodal metastatic disease. 5. Moderate right pleural effusion and associated atelectasis or consolidation, presumably malignant, however without direct evidence of pleural metastatic disease. 6. Coronary artery disease. These results were called by telephone at the time of interpretation on 05/25/2021 at 11:29 am to Dr. Lennice Sites , who verbally acknowledged these results. Aortic Atherosclerosis (ICD10-I70.0). Electronically Signed   By: Eddie Candle M.D.   On: 05/25/2021 11:30   DG CHEST PORT 1 VIEW  Result Date: 05/27/2021 CLINICAL DATA:  Short of breath.  Right thoracentesis yesterday EXAM: PORTABLE CHEST 1 VIEW COMPARISON:  05/26/2021 FINDINGS: Persistent density in the right lung base may represent neoplasm based on CT. Pneumonia possible. Small right effusion unchanged. No pneumothorax Left lung remains clear. IMPRESSION: Persistent airspace density in the right lung base may represent tumor or pneumonia. No pneumothorax. Electronically Signed  By: Franchot Gallo M.D.   On: 05/27/2021 08:19   DG Chest Portable 1 View  Result Date: 05/25/2021 CLINICAL DATA:  Breath for 1 week, hypertension, RIGHT pleural effusion by abdominal ultrasound EXAM: PORTABLE CHEST 1 VIEW COMPARISON:  Portable exam 0920  hours without priors for comparison FINDINGS: Enlargement of cardiac silhouette with pulmonary vascular congestion. Atherosclerotic calcification aorta. RIGHT pleural effusion and basilar atelectasis versus consolidation. Minimal atelectasis at LEFT base. Upper lungs clear. No pneumothorax. Bones demineralized. IMPRESSION: Enlargement of cardiac silhouette with pulmonary vascular congestion. RIGHT pleural effusion with basilar atelectasis and/or consolidation. Minimal LEFT basilar atelectasis. Electronically Signed   By: Lavonia Dana M.D.   On: 05/25/2021 10:03   ECHOCARDIOGRAM COMPLETE  Result Date: 05/26/2021    ECHOCARDIOGRAM REPORT   Patient Name:   CAMDIN HEGNER Date of Exam: 05/26/2021 Medical Rec #:  [id number]       Height:       67.0 in Accession #:    [id number]      Weight:       262.0 lb Date of Birth:  Jan 24, 1941       BSA:          2.268 m Patient Age:    63 years        BP:           144/80 mmHg Patient Gender: M               HR:           88 bpm. Exam Location:  Inpatient Procedure: 2D Echo, Cardiac Doppler and Color Doppler Indications:    Pulmonary embolus  History:        Patient has no prior history of Echocardiogram examinations.                 Signs/Symptoms:Pulmonary embolus; Risk Factors:Diabetes,                 Hypertension, Former Smoker and Obesity.  Sonographer:    Dustin Flock RDCS Referring Phys: [place]  1. Left ventricular ejection fraction, by estimation, is 60 to 65%. The left ventricle has normal function. Left ventricular endocardial border not optimally defined to evaluate regional wall motion. The left ventricular internal cavity size was mildly to moderately dilated. Left ventricular diastolic parameters are indeterminate.  2. Right ventricular systolic function is normal. The right ventricular size is normal.  3. The mitral valve is normal in structure. No evidence of mitral valve regurgitation.  4. The aortic valve is grossly normal.  Aortic valve regurgitation is not visualized. FINDINGS  Left Ventricle: Left ventricular ejection fraction, by estimation, is 60 to 65%. The left ventricle has normal function. Left ventricular endocardial border not optimally defined to evaluate regional wall motion. The left ventricular internal cavity size was mildly to moderately dilated. There is no left ventricular hypertrophy. Left ventricular diastolic parameters are indeterminate. Right Ventricle: The right ventricular size is normal. Right vetricular wall thickness was not well visualized. Right ventricular systolic function is normal. Left Atrium: Left atrial size was normal in size. Right Atrium: Right atrial size was normal in size. Pericardium: There is no evidence of pericardial effusion. Mitral Valve: The mitral valve is normal in structure. No evidence of mitral valve regurgitation. Tricuspid Valve: The tricuspid valve is grossly normal. Tricuspid valve regurgitation is mild. Aortic Valve: The aortic valve is grossly normal. Aortic valve regurgitation is not visualized. Pulmonic Valve: The pulmonic valve was grossly normal. Pulmonic valve  regurgitation is not visualized. Aorta: The aortic root and ascending aorta are structurally normal, with no evidence of dilitation. IAS/Shunts: The atrial septum is grossly normal.  LEFT VENTRICLE PLAX 2D LVIDd:         5.10 cm      Diastology LVIDs:         3.20 cm      LV e' medial:    6.96 cm/s LV PW:         1.20 cm      LV E/e' medial:  9.4 LV IVS:        1.00 cm      LV e' lateral:   6.09 cm/s LVOT diam:     2.40 cm      LV E/e' lateral: 10.7 LV SV:         79 LV SV Index:   35 LVOT Area:     4.52 cm  LV Volumes (MOD) LV vol d, MOD A4C: 157.0 ml LV vol s, MOD A4C: 66.8 ml LV SV MOD A4C:     157.0 ml RIGHT VENTRICLE RV Basal diam:  2.50 cm RV S prime:     16.80 cm/s TAPSE (M-mode): 2.4 cm LEFT ATRIUM             Index       RIGHT ATRIUM           Index LA diam:        3.10 cm 1.37 cm/m  RA Area:     13.80  cm LA Vol (A2C):   65.9 ml 29.05 ml/m RA Volume:   36.60 ml  16.14 ml/m LA Vol (A4C):   44.3 ml 19.53 ml/m LA Biplane Vol: 54.5 ml 24.03 ml/m  AORTIC VALVE LVOT Vmax:   110.00 cm/s LVOT Vmean:  64.400 cm/s LVOT VTI:    0.174 m  AORTA Ao Root diam: 2.90 cm MITRAL VALVE MV Area (PHT): 3.33 cm    SHUNTS MV Decel Time: 228 msec    Systemic VTI:  0.17 m MV E velocity: 65.10 cm/s  Systemic Diam: 2.40 cm MV A velocity: 78.40 cm/s MV E/A ratio:  0.83 Mertie Moores MD Electronically signed by Mertie Moores MD Signature Date/Time: 05/26/2021/1:17:37 PM    Final    US THORACENTESIS ASP PLEURAL SPACE W/IMG GUIDE  Result Date: 05/26/2021 INDICATION: Patient with history of right lung mass, pulmonary embolus, dyspnea, right pleural effusion. Request received for diagnostic and therapeutic right thoracentesis. EXAM: ULTRASOUND GUIDED DIAGNOSTIC AND THERAPEUTIC RIGHT THORACENTESIS MEDICATIONS: 1% lidocaine to skin and subcutaneous tissue COMPLICATIONS: None immediate. PROCEDURE: An ultrasound guided thoracentesis was thoroughly discussed with the patient and questions answered. The benefits, risks, alternatives and complications were also discussed. The patient understands and wishes to proceed with the procedure. Written consent was obtained. Ultrasound was performed to localize and mark an adequate pocket of fluid in the right chest. The area was then prepped and draped in the normal sterile fashion. 1% Lidocaine was used for local anesthesia. Under ultrasound guidance a 6 Fr Safe-T-Centesis catheter was introduced. Thoracentesis was performed. The catheter was removed and a dressing applied. FINDINGS: A total of approximately 1.3 liters of blood-tinged fluid was removed. Samples were sent to the laboratory as requested by the clinical team. IMPRESSION: Successful ultrasound guided diagnostic and therapeutic right thoracentesis yielding 1.3 liters of pleural fluid. Read by: Rowe Robert, PA-C Electronically Signed   By:  Markus Daft M.D.   On: 05/26/2021 15:10

## 2021-05-27 NOTE — Telephone Encounter (Signed)
Call made to patient, confirmed DOB. Made aware of order being placed for Pet Scan. Confirmed appt for 9/2. Voiced understanding.   Nothing further needed at this time.

## 2021-05-28 ENCOUNTER — Other Ambulatory Visit (HOSPITAL_COMMUNITY): Payer: Self-pay

## 2021-05-28 ENCOUNTER — Telehealth: Payer: Self-pay | Admitting: Pulmonary Disease

## 2021-05-28 LAB — CHOLESTEROL, BODY FLUID: Cholesterol, Fluid: 83 mg/dL

## 2021-05-28 NOTE — Telephone Encounter (Signed)
Please schedule what is available and we will let providers know.   Thanks   BI and MR first available date for PET was 9/12. Would you like [place] to r/s in office appt or keep appt and call patient with results following PET. Thanks

## 2021-05-30 LAB — BODY FLUID CULTURE W GRAM STAIN
Culture: NO GROWTH
Gram Stain: NONE SEEN

## 2021-05-31 ENCOUNTER — Telehealth: Payer: Self-pay | Admitting: Internal Medicine

## 2021-05-31 NOTE — Telephone Encounter (Signed)
Pt was able to get the PET sooner no need to move appt

## 2021-05-31 NOTE — Telephone Encounter (Signed)
Spoke with pt who gave permission to speak with wife. Pt's wife stated confusion over which inhalers pt should be using. Dr. Chase Caller stated pt should be using Breo and Incruse until they run out. The pt will then switch to Symbicort and [place] r/t insurance coverage. Details were reviewed with pt's wife who stated understanding. Noting further needed at this time.   Routing to Dr. Chase Caller as Juluis Rainier

## 2021-06-01 ENCOUNTER — Encounter: Payer: Self-pay | Admitting: *Deleted

## 2021-06-01 ENCOUNTER — Telehealth: Payer: Self-pay | Admitting: *Deleted

## 2021-06-01 DIAGNOSIS — R918 Other nonspecific abnormal finding of lung field: Secondary | ICD-10-CM

## 2021-06-01 LAB — CYTOLOGY - NON PAP

## 2021-06-01 NOTE — Telephone Encounter (Signed)
Noted.   Nothing further needed at this time.   Will close encounter.

## 2021-06-01 NOTE — Telephone Encounter (Signed)
I received referred on Mr. Hopkin today. I called and scheduled him to be seen with Dr. Julien Nordmann on 9/7 with labs.

## 2021-06-01 NOTE — Progress Notes (Signed)
I updated Dr. Julien Nordmann on Mr. [facility] referral.  He requested me to updated pathology dept to test recent cytology for PDL 1.  I updated pathology.

## 2021-06-03 ENCOUNTER — Other Ambulatory Visit: Payer: Self-pay

## 2021-06-03 ENCOUNTER — Ambulatory Visit (HOSPITAL_COMMUNITY)
Admission: RE | Admit: 2021-06-03 | Discharge: 2021-06-03 | Disposition: A | Discharge status: Home / Self Care | Payer: PPO | Source: Ambulatory Visit | Attending: Internal Medicine | Admitting: Internal Medicine

## 2021-06-03 DIAGNOSIS — E119 Type 2 diabetes mellitus without complications: Secondary | ICD-10-CM | POA: Diagnosis not present

## 2021-06-03 DIAGNOSIS — G319 Degenerative disease of nervous system, unspecified: Secondary | ICD-10-CM | POA: Diagnosis not present

## 2021-06-03 DIAGNOSIS — I771 Stricture of artery: Secondary | ICD-10-CM | POA: Diagnosis not present

## 2021-06-03 DIAGNOSIS — I509 Heart failure, unspecified: Secondary | ICD-10-CM | POA: Diagnosis present

## 2021-06-03 DIAGNOSIS — F32A Depression, unspecified: Secondary | ICD-10-CM | POA: Diagnosis present

## 2021-06-03 DIAGNOSIS — C7951 Secondary malignant neoplasm of bone: Secondary | ICD-10-CM | POA: Diagnosis not present

## 2021-06-03 DIAGNOSIS — I82452 Acute embolism and thrombosis of left peroneal vein: Secondary | ICD-10-CM | POA: Diagnosis not present

## 2021-06-03 DIAGNOSIS — K8020 Calculus of gallbladder without cholecystitis without obstruction: Secondary | ICD-10-CM | POA: Diagnosis not present

## 2021-06-03 DIAGNOSIS — I639 Cerebral infarction, unspecified: Secondary | ICD-10-CM | POA: Diagnosis not present

## 2021-06-03 DIAGNOSIS — J441 Chronic obstructive pulmonary disease with (acute) exacerbation: Secondary | ICD-10-CM | POA: Diagnosis present

## 2021-06-03 DIAGNOSIS — Z20822 Contact with and (suspected) exposure to covid-19: Secondary | ICD-10-CM | POA: Diagnosis not present

## 2021-06-03 DIAGNOSIS — I313 Pericardial effusion (noninflammatory): Secondary | ICD-10-CM | POA: Diagnosis not present

## 2021-06-03 DIAGNOSIS — C342 Malignant neoplasm of middle lobe, bronchus or lung: Secondary | ICD-10-CM | POA: Diagnosis not present

## 2021-06-03 DIAGNOSIS — J90 Pleural effusion, not elsewhere classified: Secondary | ICD-10-CM | POA: Diagnosis not present

## 2021-06-03 DIAGNOSIS — K219 Gastro-esophageal reflux disease without esophagitis: Secondary | ICD-10-CM | POA: Diagnosis not present

## 2021-06-03 DIAGNOSIS — Z789 Other specified health status: Secondary | ICD-10-CM | POA: Diagnosis not present

## 2021-06-03 DIAGNOSIS — C3491 Malignant neoplasm of unspecified part of right bronchus or lung: Secondary | ICD-10-CM | POA: Diagnosis not present

## 2021-06-03 DIAGNOSIS — C3490 Malignant neoplasm of unspecified part of unspecified bronchus or lung: Secondary | ICD-10-CM | POA: Diagnosis not present

## 2021-06-03 DIAGNOSIS — I10 Essential (primary) hypertension: Secondary | ICD-10-CM | POA: Diagnosis not present

## 2021-06-03 DIAGNOSIS — R0600 Dyspnea, unspecified: Secondary | ICD-10-CM | POA: Diagnosis not present

## 2021-06-03 DIAGNOSIS — Z515 Encounter for palliative care: Secondary | ICD-10-CM | POA: Diagnosis not present

## 2021-06-03 DIAGNOSIS — G479 Sleep disorder, unspecified: Secondary | ICD-10-CM | POA: Diagnosis not present

## 2021-06-03 DIAGNOSIS — I700 Atherosclerosis of aorta: Secondary | ICD-10-CM | POA: Diagnosis not present

## 2021-06-03 DIAGNOSIS — J910 Malignant pleural effusion: Secondary | ICD-10-CM | POA: Diagnosis not present

## 2021-06-03 DIAGNOSIS — Z711 Person with feared health complaint in whom no diagnosis is made: Secondary | ICD-10-CM | POA: Diagnosis not present

## 2021-06-03 DIAGNOSIS — N289 Disorder of kidney and ureter, unspecified: Secondary | ICD-10-CM | POA: Diagnosis not present

## 2021-06-03 DIAGNOSIS — R0902 Hypoxemia: Secondary | ICD-10-CM | POA: Diagnosis not present

## 2021-06-03 DIAGNOSIS — I672 Cerebral atherosclerosis: Secondary | ICD-10-CM | POA: Diagnosis not present

## 2021-06-03 DIAGNOSIS — J986 Disorders of diaphragm: Secondary | ICD-10-CM | POA: Diagnosis present

## 2021-06-03 DIAGNOSIS — J9690 Respiratory failure, unspecified, unspecified whether with hypoxia or hypercapnia: Secondary | ICD-10-CM | POA: Diagnosis not present

## 2021-06-03 DIAGNOSIS — D649 Anemia, unspecified: Secondary | ICD-10-CM | POA: Diagnosis not present

## 2021-06-03 DIAGNOSIS — R918 Other nonspecific abnormal finding of lung field: Secondary | ICD-10-CM | POA: Insufficient documentation

## 2021-06-03 DIAGNOSIS — Z66 Do not resuscitate: Secondary | ICD-10-CM | POA: Diagnosis not present

## 2021-06-03 DIAGNOSIS — K5900 Constipation, unspecified: Secondary | ICD-10-CM | POA: Diagnosis not present

## 2021-06-03 DIAGNOSIS — J948 Other specified pleural conditions: Secondary | ICD-10-CM | POA: Diagnosis not present

## 2021-06-03 DIAGNOSIS — Z48813 Encounter for surgical aftercare following surgery on the respiratory system: Secondary | ICD-10-CM | POA: Diagnosis not present

## 2021-06-03 DIAGNOSIS — J440 Chronic obstructive pulmonary disease with acute lower respiratory infection: Secondary | ICD-10-CM | POA: Diagnosis present

## 2021-06-03 DIAGNOSIS — E785 Hyperlipidemia, unspecified: Secondary | ICD-10-CM | POA: Diagnosis present

## 2021-06-03 DIAGNOSIS — I6389 Other cerebral infarction: Secondary | ICD-10-CM | POA: Diagnosis not present

## 2021-06-03 DIAGNOSIS — T380X5A Adverse effect of glucocorticoids and synthetic analogues, initial encounter: Secondary | ICD-10-CM | POA: Diagnosis not present

## 2021-06-03 DIAGNOSIS — I6340 Cerebral infarction due to embolism of unspecified cerebral artery: Secondary | ICD-10-CM | POA: Diagnosis not present

## 2021-06-03 DIAGNOSIS — Z7189 Other specified counseling: Secondary | ICD-10-CM | POA: Diagnosis not present

## 2021-06-03 DIAGNOSIS — J9621 Acute and chronic respiratory failure with hypoxia: Secondary | ICD-10-CM | POA: Diagnosis not present

## 2021-06-03 DIAGNOSIS — D72828 Other elevated white blood cell count: Secondary | ICD-10-CM | POA: Diagnosis not present

## 2021-06-03 DIAGNOSIS — R0602 Shortness of breath: Secondary | ICD-10-CM | POA: Diagnosis not present

## 2021-06-03 DIAGNOSIS — I110 Hypertensive heart disease with heart failure: Secondary | ICD-10-CM | POA: Diagnosis present

## 2021-06-03 DIAGNOSIS — Z9889 Other specified postprocedural states: Secondary | ICD-10-CM | POA: Diagnosis not present

## 2021-06-03 DIAGNOSIS — Z86718 Personal history of other venous thrombosis and embolism: Secondary | ICD-10-CM | POA: Diagnosis not present

## 2021-06-03 DIAGNOSIS — F4321 Adjustment disorder with depressed mood: Secondary | ICD-10-CM | POA: Diagnosis not present

## 2021-06-03 DIAGNOSIS — C3431 Malignant neoplasm of lower lobe, right bronchus or lung: Secondary | ICD-10-CM | POA: Diagnosis not present

## 2021-06-03 DIAGNOSIS — E875 Hyperkalemia: Secondary | ICD-10-CM | POA: Diagnosis not present

## 2021-06-03 DIAGNOSIS — I6621 Occlusion and stenosis of right posterior cerebral artery: Secondary | ICD-10-CM | POA: Diagnosis not present

## 2021-06-03 DIAGNOSIS — J9809 Other diseases of bronchus, not elsewhere classified: Secondary | ICD-10-CM | POA: Diagnosis not present

## 2021-06-03 DIAGNOSIS — G4733 Obstructive sleep apnea (adult) (pediatric): Secondary | ICD-10-CM | POA: Diagnosis not present

## 2021-06-03 DIAGNOSIS — I2699 Other pulmonary embolism without acute cor pulmonale: Secondary | ICD-10-CM | POA: Diagnosis not present

## 2021-06-03 DIAGNOSIS — D6869 Other thrombophilia: Secondary | ICD-10-CM | POA: Diagnosis not present

## 2021-06-03 DIAGNOSIS — C782 Secondary malignant neoplasm of pleura: Secondary | ICD-10-CM | POA: Diagnosis not present

## 2021-06-03 DIAGNOSIS — C3401 Malignant neoplasm of right main bronchus: Secondary | ICD-10-CM | POA: Diagnosis not present

## 2021-06-03 DIAGNOSIS — N179 Acute kidney failure, unspecified: Secondary | ICD-10-CM | POA: Diagnosis present

## 2021-06-03 DIAGNOSIS — I6601 Occlusion and stenosis of right middle cerebral artery: Secondary | ICD-10-CM | POA: Diagnosis not present

## 2021-06-03 DIAGNOSIS — J189 Pneumonia, unspecified organism: Secondary | ICD-10-CM | POA: Diagnosis not present

## 2021-06-03 LAB — GLUCOSE, CAPILLARY: Glucose-Capillary: 115 mg/dL — ABNORMAL HIGH (ref 70–99)

## 2021-06-03 LAB — MISC LABCORP TEST (SEND OUT): Labcorp test code: 9985

## 2021-06-03 MED ORDER — FLUDEOXYGLUCOSE F - 18 (FDG) INJECTION
13.6000 | Freq: Once | INTRAVENOUS | Status: AC
  Administered 2021-06-03: 12.9 via INTRAVENOUS

## 2021-06-04 ENCOUNTER — Inpatient Hospital Stay: Payer: PPO | Admitting: Primary Care

## 2021-06-04 ENCOUNTER — Inpatient Hospital Stay (HOSPITAL_COMMUNITY)
Admission: EM | Admit: 2021-06-04 | Discharge: 2021-06-18 | DRG: 193 | Disposition: A | Discharge status: Hospice | Payer: PPO | Attending: Family Medicine | Admitting: Family Medicine

## 2021-06-04 ENCOUNTER — Encounter (HOSPITAL_COMMUNITY): Payer: Self-pay

## 2021-06-04 ENCOUNTER — Emergency Department (HOSPITAL_COMMUNITY): Payer: PPO

## 2021-06-04 DIAGNOSIS — F32A Depression, unspecified: Secondary | ICD-10-CM | POA: Diagnosis present

## 2021-06-04 DIAGNOSIS — D72828 Other elevated white blood cell count: Secondary | ICD-10-CM | POA: Diagnosis not present

## 2021-06-04 DIAGNOSIS — I639 Cerebral infarction, unspecified: Secondary | ICD-10-CM | POA: Diagnosis not present

## 2021-06-04 DIAGNOSIS — I10 Essential (primary) hypertension: Secondary | ICD-10-CM | POA: Diagnosis not present

## 2021-06-04 DIAGNOSIS — R0602 Shortness of breath: Secondary | ICD-10-CM | POA: Diagnosis present

## 2021-06-04 DIAGNOSIS — I509 Heart failure, unspecified: Secondary | ICD-10-CM | POA: Diagnosis present

## 2021-06-04 DIAGNOSIS — I6340 Cerebral infarction due to embolism of unspecified cerebral artery: Secondary | ICD-10-CM | POA: Diagnosis present

## 2021-06-04 DIAGNOSIS — J441 Chronic obstructive pulmonary disease with (acute) exacerbation: Secondary | ICD-10-CM | POA: Diagnosis present

## 2021-06-04 DIAGNOSIS — K219 Gastro-esophageal reflux disease without esophagitis: Secondary | ICD-10-CM | POA: Diagnosis not present

## 2021-06-04 DIAGNOSIS — C342 Malignant neoplasm of middle lobe, bronchus or lung: Secondary | ICD-10-CM | POA: Diagnosis present

## 2021-06-04 DIAGNOSIS — Z7984 Long term (current) use of oral hypoglycemic drugs: Secondary | ICD-10-CM

## 2021-06-04 DIAGNOSIS — Z515 Encounter for palliative care: Secondary | ICD-10-CM | POA: Diagnosis not present

## 2021-06-04 DIAGNOSIS — Z79899 Other long term (current) drug therapy: Secondary | ICD-10-CM

## 2021-06-04 DIAGNOSIS — Z20822 Contact with and (suspected) exposure to covid-19: Secondary | ICD-10-CM | POA: Diagnosis present

## 2021-06-04 DIAGNOSIS — J986 Disorders of diaphragm: Secondary | ICD-10-CM | POA: Diagnosis present

## 2021-06-04 DIAGNOSIS — Z9889 Other specified postprocedural states: Secondary | ICD-10-CM

## 2021-06-04 DIAGNOSIS — Z7951 Long term (current) use of inhaled steroids: Secondary | ICD-10-CM

## 2021-06-04 DIAGNOSIS — J90 Pleural effusion, not elsewhere classified: Secondary | ICD-10-CM

## 2021-06-04 DIAGNOSIS — J910 Malignant pleural effusion: Secondary | ICD-10-CM | POA: Diagnosis present

## 2021-06-04 DIAGNOSIS — T380X5A Adverse effect of glucocorticoids and synthetic analogues, initial encounter: Secondary | ICD-10-CM | POA: Diagnosis not present

## 2021-06-04 DIAGNOSIS — J189 Pneumonia, unspecified organism: Secondary | ICD-10-CM | POA: Diagnosis present

## 2021-06-04 DIAGNOSIS — E785 Hyperlipidemia, unspecified: Secondary | ICD-10-CM | POA: Diagnosis present

## 2021-06-04 DIAGNOSIS — C7951 Secondary malignant neoplasm of bone: Secondary | ICD-10-CM | POA: Diagnosis present

## 2021-06-04 DIAGNOSIS — D6869 Other thrombophilia: Secondary | ICD-10-CM | POA: Diagnosis present

## 2021-06-04 DIAGNOSIS — Z66 Do not resuscitate: Secondary | ICD-10-CM | POA: Diagnosis not present

## 2021-06-04 DIAGNOSIS — E119 Type 2 diabetes mellitus without complications: Secondary | ICD-10-CM | POA: Diagnosis present

## 2021-06-04 DIAGNOSIS — J9621 Acute and chronic respiratory failure with hypoxia: Secondary | ICD-10-CM | POA: Diagnosis present

## 2021-06-04 DIAGNOSIS — Z86718 Personal history of other venous thrombosis and embolism: Secondary | ICD-10-CM

## 2021-06-04 DIAGNOSIS — I6389 Other cerebral infarction: Secondary | ICD-10-CM | POA: Diagnosis not present

## 2021-06-04 DIAGNOSIS — K5900 Constipation, unspecified: Secondary | ICD-10-CM | POA: Diagnosis present

## 2021-06-04 DIAGNOSIS — I110 Hypertensive heart disease with heart failure: Secondary | ICD-10-CM | POA: Diagnosis present

## 2021-06-04 DIAGNOSIS — N179 Acute kidney failure, unspecified: Secondary | ICD-10-CM | POA: Diagnosis present

## 2021-06-04 DIAGNOSIS — C782 Secondary malignant neoplasm of pleura: Secondary | ICD-10-CM | POA: Diagnosis present

## 2021-06-04 DIAGNOSIS — Z7901 Long term (current) use of anticoagulants: Secondary | ICD-10-CM

## 2021-06-04 DIAGNOSIS — J440 Chronic obstructive pulmonary disease with acute lower respiratory infection: Secondary | ICD-10-CM | POA: Diagnosis present

## 2021-06-04 DIAGNOSIS — I2699 Other pulmonary embolism without acute cor pulmonale: Secondary | ICD-10-CM | POA: Diagnosis present

## 2021-06-04 DIAGNOSIS — Z87891 Personal history of nicotine dependence: Secondary | ICD-10-CM

## 2021-06-04 DIAGNOSIS — C3491 Malignant neoplasm of unspecified part of right bronchus or lung: Secondary | ICD-10-CM | POA: Diagnosis not present

## 2021-06-04 DIAGNOSIS — C3490 Malignant neoplasm of unspecified part of unspecified bronchus or lung: Secondary | ICD-10-CM | POA: Diagnosis not present

## 2021-06-04 DIAGNOSIS — E875 Hyperkalemia: Secondary | ICD-10-CM | POA: Diagnosis present

## 2021-06-04 DIAGNOSIS — Z7189 Other specified counseling: Secondary | ICD-10-CM | POA: Diagnosis not present

## 2021-06-04 DIAGNOSIS — Z711 Person with feared health complaint in whom no diagnosis is made: Secondary | ICD-10-CM | POA: Diagnosis not present

## 2021-06-04 DIAGNOSIS — Z789 Other specified health status: Secondary | ICD-10-CM | POA: Diagnosis not present

## 2021-06-04 DIAGNOSIS — G479 Sleep disorder, unspecified: Secondary | ICD-10-CM | POA: Diagnosis not present

## 2021-06-04 HISTORY — DX: Acute embolism and thrombosis of unspecified deep veins of unspecified lower extremity: I82.409

## 2021-06-04 HISTORY — DX: Pleural effusion, not elsewhere classified: J90

## 2021-06-04 HISTORY — DX: Other pulmonary embolism without acute cor pulmonale: I26.99

## 2021-06-04 LAB — COMPREHENSIVE METABOLIC PANEL
ALT: 91 U/L — ABNORMAL HIGH (ref 0–44)
AST: 164 U/L — ABNORMAL HIGH (ref 15–41)
Albumin: 2.2 g/dL — ABNORMAL LOW (ref 3.5–5.0)
Alkaline Phosphatase: 145 U/L — ABNORMAL HIGH (ref 38–126)
Anion gap: 9 (ref 5–15)
BUN: 61 mg/dL — ABNORMAL HIGH (ref 8–23)
CO2: 20 mmol/L — ABNORMAL LOW (ref 22–32)
Calcium: 8.7 mg/dL — ABNORMAL LOW (ref 8.9–10.3)
Chloride: 107 mmol/L (ref 98–111)
Creatinine, Ser: 1.49 mg/dL — ABNORMAL HIGH (ref 0.61–1.24)
GFR, Estimated: 48 mL/min — ABNORMAL LOW (ref >60)
Glucose, Bld: 99 mg/dL (ref 70–99)
Potassium: 5.3 mmol/L — ABNORMAL HIGH (ref 3.5–5.1)
Sodium: 136 mmol/L (ref 135–145)
Total Bilirubin: 2.5 mg/dL — ABNORMAL HIGH (ref 0.3–1.2)
Total Protein: 7.3 g/dL (ref 6.5–8.1)

## 2021-06-04 LAB — URINALYSIS, ROUTINE W REFLEX MICROSCOPIC
Bilirubin Urine: NEGATIVE
Glucose, UA: NEGATIVE mg/dL
Ketones, ur: NEGATIVE mg/dL
Leukocytes,Ua: NEGATIVE
Nitrite: NEGATIVE
Specific Gravity, Urine: 1.02 (ref 1.005–1.030)
pH: 5.5 (ref 5.0–8.0)

## 2021-06-04 LAB — BASIC METABOLIC PANEL
Anion gap: 10 (ref 5–15)
BUN: 63 mg/dL — ABNORMAL HIGH (ref 8–23)
CO2: 20 mmol/L — ABNORMAL LOW (ref 22–32)
Calcium: 9.1 mg/dL (ref 8.9–10.3)
Chloride: 105 mmol/L (ref 98–111)
Creatinine, Ser: 1.55 mg/dL — ABNORMAL HIGH (ref 0.61–1.24)
GFR, Estimated: 46 mL/min — ABNORMAL LOW (ref >60)
Glucose, Bld: 112 mg/dL — ABNORMAL HIGH (ref 70–99)
Potassium: 5.9 mmol/L — ABNORMAL HIGH (ref 3.5–5.1)
Sodium: 135 mmol/L (ref 135–145)

## 2021-06-04 LAB — CBC WITH DIFFERENTIAL/PLATELET
Abs Immature Granulocytes: 0.29 10*3/uL — ABNORMAL HIGH (ref 0.00–0.07)
Basophils Absolute: 0.1 10*3/uL (ref 0.0–0.1)
Basophils Relative: 1 %
Eosinophils Absolute: 0.1 10*3/uL (ref 0.0–0.5)
Eosinophils Relative: 1 %
HCT: 32.2 % — ABNORMAL LOW (ref 39.0–52.0)
Hemoglobin: 10.2 g/dL — ABNORMAL LOW (ref 13.0–17.0)
Immature Granulocytes: 2 %
Lymphocytes Relative: 11 %
Lymphs Abs: 1.6 10*3/uL (ref 0.7–4.0)
MCH: 28.2 pg (ref 26.0–34.0)
MCHC: 31.7 g/dL (ref 30.0–36.0)
MCV: 89 fL (ref 80.0–100.0)
Monocytes Absolute: 1.6 10*3/uL — ABNORMAL HIGH (ref 0.1–1.0)
Monocytes Relative: 11 %
Neutro Abs: 10.9 10*3/uL — ABNORMAL HIGH (ref 1.7–7.7)
Neutrophils Relative %: 74 %
Platelets: 609 10*3/uL — ABNORMAL HIGH (ref 150–400)
RBC: 3.62 MIL/uL — ABNORMAL LOW (ref 4.22–5.81)
RDW: 16.4 % — ABNORMAL HIGH (ref 11.5–15.5)
WBC: 14.6 10*3/uL — ABNORMAL HIGH (ref 4.0–10.5)
nRBC: 0 % (ref 0.0–0.2)

## 2021-06-04 LAB — BRAIN NATRIURETIC PEPTIDE: B Natriuretic Peptide: 490.3 pg/mL — ABNORMAL HIGH (ref 0.0–100.0)

## 2021-06-04 LAB — TROPONIN I (HIGH SENSITIVITY)
Troponin I (High Sensitivity): 7 ng/L (ref <18)
Troponin I (High Sensitivity): 11 ng/L (ref <18)

## 2021-06-04 LAB — MAGNESIUM: Magnesium: 1.9 mg/dL (ref 1.7–2.4)

## 2021-06-04 MED ORDER — SODIUM ZIRCONIUM CYCLOSILICATE 10 G PO PACK
10.0000 g | PACK | Freq: Once | ORAL | Status: AC
  Administered 2021-06-04: 10 g via ORAL
  Filled 2021-06-04: qty 1

## 2021-06-04 MED ORDER — ACETAMINOPHEN 650 MG RE SUPP: 650.0000 mg | Freq: Four times a day (QID) | RECTAL | Status: DC | PRN

## 2021-06-04 MED ORDER — METHYLPREDNISOLONE SODIUM SUCC 125 MG IJ SOLR
80.0000 mg | Freq: Two times a day (BID) | INTRAMUSCULAR | Status: DC
  Administered 2021-06-04 – 2021-06-05 (×2): 80 mg via INTRAVENOUS
  Filled 2021-06-04 (×2): qty 2

## 2021-06-04 MED ORDER — MOMETASONE FURO-FORMOTEROL FUM 100-5 MCG/ACT IN AERO
2.0000 | INHALATION_SPRAY | Freq: Two times a day (BID) | RESPIRATORY_TRACT | Status: DC
  Administered 2021-06-04 – 2021-06-10 (×12): 2 via RESPIRATORY_TRACT
  Filled 2021-06-04 (×2): qty 8.8

## 2021-06-04 MED ORDER — APIXABAN 5 MG PO TABS
5.0000 mg | ORAL_TABLET | Freq: Two times a day (BID) | ORAL | Status: DC
  Administered 2021-06-04 – 2021-06-18 (×28): 5 mg via ORAL
  Filled 2021-06-04 (×29): qty 1

## 2021-06-04 MED ORDER — SODIUM CHLORIDE 0.9 % IV BOLUS
500.0000 mL | Freq: Once | INTRAVENOUS | Status: AC
  Administered 2021-06-04: 500 mL via INTRAVENOUS

## 2021-06-04 MED ORDER — ACETAMINOPHEN 325 MG PO TABS
650.0000 mg | ORAL_TABLET | Freq: Four times a day (QID) | ORAL | Status: DC | PRN
  Administered 2021-06-08 – 2021-06-16 (×10): 650 mg via ORAL
  Filled 2021-06-04 (×12): qty 2

## 2021-06-04 MED ORDER — ALBUTEROL SULFATE (2.5 MG/3ML) 0.083% IN NEBU
2.5000 mg | INHALATION_SOLUTION | RESPIRATORY_TRACT | Status: DC | PRN
  Administered 2021-06-08 – 2021-06-13 (×7): 2.5 mg via RESPIRATORY_TRACT
  Filled 2021-06-04 (×7): qty 3

## 2021-06-04 MED ORDER — FUROSEMIDE 10 MG/ML IJ SOLN
40.0000 mg | Freq: Once | INTRAMUSCULAR | Status: AC
  Administered 2021-06-04: 40 mg via INTRAVENOUS
  Filled 2021-06-04: qty 4

## 2021-06-04 NOTE — ED Provider Notes (Signed)
[facility] DEPT Provider Note   CSN: [id number] Arrival date & time: 06/04/21  1435     History Chief Complaint  Patient presents with   Shortness of [place] is a 80 y.o. male.  The history is provided by the patient, medical records and the spouse. No language interpreter was used.  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  History obtained through patient and through wife who is at bedside.  Last month patient was admitted to the hospital due to having shortness of breath.  He was found to have a right pulmonary mass with pleural effusion along with evidence of a pulmonary embolus.  Patient also had a positive DVT study.  He was discharged home with supplemental oxygen and Eliquis.  Patient did have a Thoracentesis during his hospitalization.  He also had a PET scan performed yesterday along with labs.  He was notified by his provider to take him to the ED for further evaluation as he may need IV fluid and possible admission due to his impaired renal function.  Patient mention his shortness of breath has been ongoing for the past several weeks.  He also has had 40 pound weight loss within the past few months.  He denies having fever or chills having any active chest pain or abdominal pain.  He has been fully vaccinated for COVID-19.  Patient quit smoking 1996    Past Medical History:  Diagnosis Date   Diabetes mellitus without complication ([place])    Hypertension     Patient Active Problem List   Diagnosis Date Noted   Pulmonary embolism ([place]) 05/25/2021   Pulmonary emboli ([place]) 05/25/2021    Past Surgical History:  Procedure Laterality Date   HERNIA REPAIR         No family history on file.  Social History   Tobacco Use   Smoking  status: Former    Types: Cigarettes   Smokeless tobacco: Never  Substance Use Topics   Alcohol use: Not Currently   Drug use: Never    Home Medications Prior to Admission medications   Medication Sig Start Date End Date Taking? Authorizing Provider  acetaminophen (TYLENOL) 325 MG tablet Take 2 tablets (650 mg total) by mouth every 6 (six) hours as needed for mild pain (or Fever >/= 101). 05/27/21   Raiford Noble Latif, DO  amLODipine (NORVASC) 10 MG tablet Take 10 mg by mouth daily. 05/04/21   [provider]  amoxicillin (AMOXIL) 500 MG tablet Take 1,000 mg by mouth 2 (two) times daily. 05/24/21   [provider]  apixaban (ELIQUIS) 5 MG TABS tablet Take 2 tablets (10 mg total) by mouth 2 (two) times daily for 7 days, THEN 1 tablet (5 mg total) 2 (two) times daily for 28 days. 05/27/21 07/01/21  Raiford Noble Latif, DO  budesonide-formoterol (SYMBICORT) 80-4.5 MCG/ACT inhaler Inhale 2 puffs into the lungs in the morning and at bedtime. 05/27/21   Raiford Noble Latif, DO  clarithromycin (BIAXIN) 500 MG tablet Take 500 mg by mouth 2 (two) times daily. Start date : 05/24/21    [provider]  losartan (COZAAR) 100 MG tablet Take 100 mg by mouth daily. 03/29/21   [provider]  metFORMIN (GLUCOPHAGE-XR) 500 MG 24 hr tablet Take 1,000 mg by  mouth daily. 02/28/21   [provider]  ondansetron (ZOFRAN) 4 MG tablet Take 1 tablet (4 mg total) by mouth every 6 (six) hours as needed for nausea. 05/27/21   Raiford Noble Latif, DO  pantoprazole (PROTONIX) 40 MG tablet Take 40 mg by mouth daily. 05/04/21   [provider]  simvastatin (ZOCOR) 10 MG tablet Take 10 mg by mouth daily. 05/04/21   [provider]  sodium chloride (OCEAN) 0.65 % nasal spray Place 1 spray into the nose at bedtime.    [provider]  tiotropium (SPIRIVA HANDIHALER) 18 MCG inhalation capsule Place 1 capsule (18 mcg total) into inhaler and inhale daily. 05/27/21   Kerney Elbe, DO    Allergies    Patient has no known allergies.  Review of Systems   Review of Systems  Respiratory:  Positive for shortness of breath.   All other systems reviewed and are negative.  Physical Exam Updated Vital Signs BP (!) 141/60 (BP Location: Right Arm)   Pulse 88   Temp 98.6 F (37 C) (Oral)   Resp (!) 26   Ht 5\' 7"  (1.702 m)   SpO2 97%   BMI 41.04 kg/m   Physical Exam Vitals and nursing note reviewed.  Constitutional:      General: He is not in acute distress.    Appearance: He is well-developed. He is obese.     Comments: Elderly male ill-appearing in mild respiratory discomfort  HENT:     Head: Atraumatic.  Eyes:     Conjunctiva/sclera: Conjunctivae normal.  Neck:     Vascular: No JVD.  Cardiovascular:     Rate and Rhythm: Normal rate and regular rhythm.  Pulmonary:     Breath sounds: No stridor. Decreased breath sounds and rhonchi present. No rales.     Comments: Tachypneic Abdominal:     Palpations: Abdomen is soft.  Musculoskeletal:     Cervical back: Normal range of motion and neck supple.     Right lower leg: Edema present.     Left lower leg: Edema present.  Skin:    Capillary Refill: Capillary refill takes less than 2 seconds.     Findings: No rash.  Neurological:     Mental Status: He is alert. Mental status is at baseline.  Psychiatric:        Mood and Affect: Mood normal.    ED Results / Procedures / Treatments   Labs (all labs ordered are listed, but only abnormal results are displayed) Labs Reviewed  BASIC METABOLIC PANEL - Abnormal; Notable for the following components:      Result Value   Potassium 5.9 (*)    CO2 20 (*)    Glucose, Bld 112 (*)    BUN 63 (*)    Creatinine, Ser 1.55 (*)    GFR, Estimated 46 (*)    All other components within normal limits  BRAIN NATRIURETIC PEPTIDE - Abnormal; Notable for the following components:   B Natriuretic Peptide 490.3 (*)    All other components within normal limits   CBC WITH DIFFERENTIAL/PLATELET - Abnormal; Notable for the following components:   WBC 14.6 (*)    RBC 3.62 (*)    Hemoglobin 10.2 (*)    HCT 32.2 (*)    RDW 16.4 (*)    Platelets 609 (*)    Neutro Abs 10.9 (*)    Monocytes Absolute 1.6 (*)    Abs Immature Granulocytes 0.29 (*)    All other components  within normal limits  URINALYSIS, ROUTINE W REFLEX MICROSCOPIC - Abnormal; Notable for the following components:   Color, Urine YELLOW (*)    APPearance CLOUDY (*)    Hgb urine dipstick TRACE (*)    Protein, ur TRACE (*)    Bacteria, UA RARE (*)    All other components within normal limits  SARS CORONAVIRUS 2 (TAT 6-24 HRS)  PHOSPHORUS  MAGNESIUM  MAGNESIUM  COMPREHENSIVE METABOLIC PANEL  CBC WITH DIFFERENTIAL/PLATELET  TROPONIN I (HIGH SENSITIVITY)    EKG None ED ECG REPORT   Date: 06/04/2021  Rate: 88  Rhythm: normal sinus rhythm  QRS Axis: normal  Intervals: normal  ST/T Wave abnormalities: normal  Conduction Disutrbances:none  Narrative Interpretation:   Old EKG Reviewed: unchanged  I have personally reviewed the EKG tracing and agree with the computerized printout as noted.   Radiology DG Chest 2 View  Result Date: 06/04/2021 CLINICAL DATA:  Shortness of breath.  History of lung cancer. EXAM: CHEST - 2 VIEW COMPARISON:  Radiograph 05/27/2021.  PET CT yesterday. FINDINGS: Again seen elevation of right hemidiaphragm. Right pleural effusion and right pulmonary mass seen on PET CT yesterday. Stable heart size and mediastinal contours. Peribronchial thickening. No pneumothorax. No significant left pleural effusion. No acute osseous abnormalities are seen. Known lytic lesions on prior PET are not seen by radiograph. IMPRESSION: 1. Unchanged elevation of right hemidiaphragm. Patient with known right pulmonary mass and pleural effusion, evaluated on PET yesterday. 2. Bronchial thickening. Electronically Signed   By: Keith Rake M.D.   On: 06/04/2021 16:31   NM PET  Image Initial (PI) Skull Base To Thigh  Result Date: 06/04/2021 CLINICAL DATA:  Initial treatment strategy for lung nodule. EXAM: NUCLEAR MEDICINE PET SKULL BASE TO THIGH TECHNIQUE: 12.9 mCi F-18 FDG was injected intravenously. Full-ring PET imaging was performed from the skull base to thigh after the radiotracer. CT data was obtained and used for attenuation correction and anatomic localization. Fasting blood glucose: 115 mg/dl COMPARISON:  Chest CTA dated May 25, 2021 FINDINGS: Mediastinal blood pool activity: SUV max 3.1 Liver activity: SUV max 3.8. NECK: Hypermetabolic right supraclavicular lymph node measuring 7 mm in short axis on image number 39 with an SUV max of 6.8. Incidental CT findings: none CHEST: Hypermetabolic mass centered in the right upper lobe, difficult to measure, but approximately 9.4 x 9.4 cm with an SUV max of 23. Hypermetabolic right paratracheal, subcarinal and bilateral hilar lymph nodes. Reference subcarinal lymph node measures approximately 2.0 cm in short axis with an SUV max of 11.2. Reference right hilar lymph node measures 1.5 cm in short axis on image 69 with an SUV max of 15.2. Reference left hilar lymph node measures approximately 1.4 cm in short axis with an SUV max of 10.4. Small loculated right pleural effusion with pleural FDG uptake, SUV max of 6.3. Incidental CT findings: No pericardial effusion. Calcifications of the LAD and circumflex. Atherosclerotic disease of the thoracic aorta. ABDOMEN/PELVIS: No abnormal hypermetabolic activity within the liver, pancreas, adrenal glands, or spleen. No hypermetabolic lymph nodes in the abdomen or pelvis. Incidental CT findings: Cholelithiasis and sigmoid diverticula. SKELETON: Scattered foci of osseous hypermetabolic activity which correlate with subtle lytic lesions in the right iliac bone, T12 vertebral body, lateral sixth rib, and left scapula. Reference right iliac lesion demonstrates an SUV max of 8.2. Incidental CT findings:  none IMPRESSION: Large hypermetabolic mass centered in the right middle lobe which occludes the right middle lobe bronchi. Small loculated pleural effusion with pleural  FDG uptake, compatible with malignant effusion. Hypermetabolic right supraclavicular, right paratracheal, subcarinal, and bilateral hilar lymph nodes. Scattered foci of osseous hypermetabolic activity which correlate with subtle lytic lesions, compatible with osseous metastatic disease. Aortic Atherosclerosis (ICD10-I70.0). Electronically Signed   By: Yetta Glassman M.D.   On: 06/04/2021 16:20    Procedures Procedures   Medications Ordered in ED Medications  acetaminophen (TYLENOL) tablet 650 mg (has no administration in time range)    Or  acetaminophen (TYLENOL) suppository 650 mg (has no administration in time range)  sodium chloride 0.9 % bolus 500 mL (0 mLs Intravenous Stopped 06/04/21 1840)  sodium zirconium cyclosilicate (LOKELMA) packet 10 g (10 g Oral Given 06/04/21 1810)  furosemide (LASIX) injection 40 mg (40 mg Intravenous Given 06/04/21 1906)    ED Course  I have reviewed the triage vital signs and the nursing notes.  Pertinent labs & imaging results that were available during my care of the patient were reviewed by me and considered in my medical decision making (see chart for details).    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  Had a PET scan done yesterday as  well as a follow-up with his provider had some labs done.  He was noted to have impaired renal function and was recommended to come to the ER for further evaluation.  5:20 PM Labs remarkable for potassium of 5.9.  EKG without significant Peaked T, will give Lokelma.  Care discussed with DR. Schlossman.    7:22 PM Elevated BNP, elevated potassium, patient appears to be fluid overload with worsening shortness of breath.  Hospitalist, Dr. Velia Meyer and will admit patient for further care.   Domenic Moras, PA-C 06/04/21 1924    Gareth Morgan, MD 06/05/21 [phone]

## 2021-06-04 NOTE — ED Notes (Signed)
Patient c/o pain in IV. NS bolus stopped and IV team consult placed

## 2021-06-04 NOTE — ED Triage Notes (Signed)
[facility] reports pt started getting SHOB around lunch. Hx lung cancer. 2.5L [place] at baseline. A&Ox4. Last treatment early this week. Reports having pleural effusion drained recently, feels the same as before that.   BP 148/60 HR 88 SpO2 94% 2.5L [place] CBG 115 RR 20

## 2021-06-04 NOTE — H&P (Signed)
History and Physical    PLEASE NOTE THAT DRAGON DICTATION SOFTWARE WAS USED IN THE CONSTRUCTION OF THIS NOTE.   Kevin Hobbs TUU:[id number] DOB: July 20, 1941 DOA: 06/04/2021  PCP: Robyne Peers, MD Patient coming from: home   I have personally briefly reviewed patient's old medical records in [place]  Chief Complaint: Shortness of breath  HPI: Kevin Hobbs is a 80 y.o. male with medical history significant for recently diagnosed acute pulmonary embolism, right pleural effusion, right middle lobe lung mass, recently diagnosed acute left lower extremity DVT, type 2 diabetes mellitus, who is admitted to [facility] on 06/04/2021 with acute on chronic hypoxic respiratory failure after presenting from home to [facility] ED complaining of shortness of breath.   The patient was recently hospitalized at [facility] long for acute hypoxic respiratory failure, with associated hospitalization lasting from 05/25/2021 to 8/25/2022after presenting at that time complaining of shortness of breath, dry cough over the preceding 1 week.  Work-up during previous hospitalization included CTA chest which showed evidence of acute pulmonary embolism as well as right pleural effusion, and right middle lobe lung mass, which was reportedly new.  Pulmonology consulted during his previous hospitalization, considering diagnostic/therapeutic thoracentesis performed by IR on 05/26/2021, with associated removal 1.3 L of pleural fluid, reportedly.  Blood-tinged in nature.  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  In the setting of new diagnosis of acute pulmonary embolism, patient was initially treated with heparin  drip before being transitioned to Eliquis.  Additionally, he was discharged Symbicort and Spiriva, representing new medications for him.  Additionally, during his previous hospitalization, new supplemental oxygen requirement was noted, and the patient was subsequently discharged on 2 L continuous nasal cannula.  On 06/03/2021, the patient underwent PET scan.  He was supposed to have follow-up with pulmonology on 06/04/2021, ultimately presented to the [facility] long ED today prior to this follow-up appointment.  Additionally, establishment of care with oncology, specifically Dr.Mohamed has been arranged for, with first outpatient appointment noted to be scheduled for 06/09/2021.   Following discharge to home on 05/27/2021, the patient reports that his shortness of breath, while not at baseline, was improved relative to that which she was experiencing leading up to his initial presentation to [facility] long on 05/25/2021.  However, over the last 3 to 4 days, he notes progressive worsening of his shortness of breath with continuation of dry cough.  When asked specifically if he feels that his shortness of breath worsens when lying flat/supine, responds by saying "I think it might, but I haven't really noticed".  He denies any worsening of peripheral edema.  While he reports outstanding compliance in taking his Eliquis following discharge from the hospital, he acknowledges that he has not been routinely taking either his Symbicort or Spiriva.  Denies any recent subjective fever, chills, rigors, or generalized myalgias.  Per my conversations with him, the patient reports good oral intake following discharge from the hospital.  Denies any associated chest pain, palpitations, diaphoresis, PND, Nausea/vomiting.  Denies any associated hemoptysis.  While the patient denies any known underlying diagnosis of COPD, he conveys that he is a former smoker, having smoked 1 to 1.5 packs/day for at least 20 years, before completely quitting  smoking in 1996.    ED Course:  Vital signs in the ED were notable for the following: Temperature max 98.6; heart rate 7889; pressure 117/58; respiratory rate 22-28; initial oxygen saturation noted to be 90 to 91% on baseline 2 L nasal cannula, with ensuing improvement to 97 to 100% on 3 L nasal cannula.  Labs were notable for the following: BMP notable for the following: Potassium 5.9, bicarbonate 20, anion gap 10, creatinine 1.5 five 21.58 on 05/27/2021 and relative to 1.83 on 05/25/2021.  BNP 490 compared to 11 on 05/25/2021.  High-sensitivity troponin ordered, with result currently pending.  CBC notable for white blood cell count 14,600, hemoglobin 10.2 compared to 11.1 on 05/27/2021.  Urinalysis showed no white blood cells, leukocyte Estrace negative.  Nitrate negative.Marland Kitchen  COVID-19 PCR was performed in the ED today, with result currently pending.  Imaging and additional notable ED work-up: EKG shows sinus rhythm with heart rate 88, normal intervals, no evidence of T wave or ST changes.  2 view chest x-ray, in comparison to chest x-ray from 05/27/2021, which was 1 day post thoracentesis, showed unchanged right hemidiaphragm, unchanged right pleural effusion, no evidence of left-sided effusion, and no evidence of edema, infiltrate, while also showing evidence of bronchial thickening.   While in the ED, the following were administered: Initially a 500 cc normal saline bolus was ordered, however after receiving a minimal volume of this, the normal saline was discontinued and the patient received Lasix 40 mg IV x1.  He also received Lokelma 10 g p.o. x1.  Subsequently, the patient was admitted for further evaluation and management of acute on chronic hypoxic respiratory failure.     Review of Systems: As per HPI otherwise 10 point review of systems negative.   Past Medical History:  Diagnosis Date   Acute DVT (deep venous thrombosis) (HCC)    LLE; Aug '22   Acute pulmonary embolism [facility])    Aug '22    Diabetes mellitus without complication ([place])    Hypertension    Pleural effusion    right-sided    Past Surgical History:  Procedure Laterality Date   HERNIA REPAIR      Social History:  reports that he has quit smoking. His smoking use included cigarettes. He has never used smokeless tobacco. He reports that he does not currently use alcohol. He reports that he does not use drugs.   No Known Allergies   Family history reviewed and not pertinent    Prior to Admission medications   Medication Sig Start Date End Date Taking? Authorizing Provider  acetaminophen (TYLENOL) 325 MG tablet Take 2 tablets (650 mg total) by mouth every 6 (six) hours as needed for mild pain (or Fever >/= 101). 05/27/21   Raiford Noble Latif, DO  amLODipine (NORVASC) 10 MG tablet Take 10 mg by mouth daily. 05/04/21   [provider]  amoxicillin (AMOXIL) 500 MG tablet Take 1,000 mg by mouth 2 (two) times daily. 05/24/21   [provider]  apixaban (ELIQUIS) 5 MG TABS tablet Take 2 tablets (10 mg total) by mouth 2 (two) times daily for 7 days, THEN 1 tablet (5 mg total) 2 (two) times daily for 28 days. 05/27/21 07/01/21  Raiford Noble Latif, DO  budesonide-formoterol (SYMBICORT) 80-4.5 MCG/ACT inhaler Inhale 2 puffs into the lungs in the morning and at bedtime. 05/27/21   Raiford Noble Latif, DO  clarithromycin (BIAXIN) 500 MG tablet Take 500 mg by mouth 2 (two) times daily. Start date : 05/24/21  [provider]  losartan (COZAAR) 100 MG tablet Take 100 mg by mouth daily. 03/29/21   [provider]  metFORMIN (GLUCOPHAGE-XR) 500 MG 24 hr tablet Take 1,000 mg by mouth daily. 02/28/21   [provider]  ondansetron (ZOFRAN) 4 MG tablet Take 1 tablet (4 mg total) by mouth every 6 (six) hours as needed for nausea. 05/27/21   Raiford Noble Latif, DO  pantoprazole (PROTONIX) 40 MG tablet Take 40 mg by mouth daily. 05/04/21   [provider]  simvastatin (ZOCOR) 10 MG  tablet Take 10 mg by mouth daily. 05/04/21   [provider]  sodium chloride (OCEAN) 0.65 % nasal spray Place 1 spray into the nose at bedtime.    [provider]  tiotropium (SPIRIVA HANDIHALER) 18 MCG inhalation capsule Place 1 capsule (18 mcg total) into inhaler and inhale daily. 05/27/21   Kerney Elbe, DO     Objective    Physical Exam: Vitals:   06/04/21 1702 06/04/21 1730 06/04/21 1800 06/04/21 1836  BP: (!) 141/65 (!) 117/58 134/62 (!) 135/59  Pulse: 89 78 85 89  Resp: (!) 28 (!) 30 (!) 32 (!) 22  Temp:      TempSrc:      SpO2: 100% 98% 97% 97%  Height:        General: appears to be stated age; alert, oriented; increased work of breathing noted Skin: warm, dry, no rash Head:  AT/[place] Mouth:  Oral mucosa membranes appear moist, normal dentition Neck: supple; trachea midline Heart:  RRR; did not appreciate any M/R/G Lungs: Diminished right basilar breath sounds noted, otherwise, CTAB, did not appreciate any wheezes, rales, or rhonchi Abdomen: + BS; soft, ND, NT Vascular: 2+ pedal pulses b/l; 2+ radial pulses b/l Extremities: no peripheral edema, no muscle wasting Neuro: strength and sensation intact in upper and lower extremities b/l     Labs on Admission: I have personally reviewed following labs and imaging studies  CBC: Recent Labs  Lab 06/04/21 1635  WBC 14.6*  NEUTROABS 10.9*  HGB 10.2*  HCT 32.2*  MCV 89.0  PLT 370*   Basic Metabolic Panel: Recent Labs  Lab 06/04/21 1635  NA 135  K 5.9*  CL 105  CO2 20*  GLUCOSE 112*  BUN 63*  CREATININE 1.55*  CALCIUM 9.1   GFR: Estimated Creatinine Clearance: 48.4 mL/min (A) (by C-G formula based on SCr of 1.55 mg/dL (H)). Liver Function Tests: No results for input(s): AST, ALT, ALKPHOS, BILITOT, PROT, ALBUMIN in the last 168 hours. No results for input(s): LIPASE, AMYLASE in the last 168 hours. No results for input(s): AMMONIA in the last 168 hours. Coagulation Profile: No  results for input(s): INR, PROTIME in the last 168 hours. Cardiac Enzymes: No results for input(s): CKTOTAL, CKMB, CKMBINDEX, TROPONINI in the last 168 hours. BNP (last 3 results) No results for input(s): PROBNP in the last 8760 hours. HbA1C: No results for input(s): HGBA1C in the last 72 hours. CBG: Recent Labs  Lab 06/03/21 0928  GLUCAP 115*   Lipid Profile: No results for input(s): CHOL, HDL, LDLCALC, TRIG, CHOLHDL, LDLDIRECT in the last 72 hours. Thyroid Function Tests: No results for input(s): TSH, T4TOTAL, FREET4, T3FREE, THYROIDAB in the last 72 hours. Anemia Panel: No results for input(s): VITAMINB12, FOLATE, FERRITIN, TIBC, IRON, RETICCTPCT in the last 72 hours. Urine analysis:    Component Value Date/Time   COLORURINE YELLOW (A) 06/04/2021 1812   APPEARANCEUR CLOUDY (A) 06/04/2021 1812   LABSPEC 1.020 06/04/2021 1812  PHURINE 5.5 06/04/2021 1812   GLUCOSEU NEGATIVE 06/04/2021 1812   HGBUR TRACE (A) 06/04/2021 1812   BILIRUBINUR NEGATIVE 06/04/2021 1812   KETONESUR NEGATIVE 06/04/2021 1812   PROTEINUR TRACE (A) 06/04/2021 1812   NITRITE NEGATIVE 06/04/2021 1812   LEUKOCYTESUR NEGATIVE 06/04/2021 1812    Radiological Exams on Admission: DG Chest 2 View  Result Date: 06/04/2021 CLINICAL DATA:  Shortness of breath.  History of lung cancer. EXAM: CHEST - 2 VIEW COMPARISON:  Radiograph 05/27/2021.  PET CT yesterday. FINDINGS: Again seen elevation of right hemidiaphragm. Right pleural effusion and right pulmonary mass seen on PET CT yesterday. Stable heart size and mediastinal contours. Peribronchial thickening. No pneumothorax. No significant left pleural effusion. No acute osseous abnormalities are seen. Known lytic lesions on prior PET are not seen by radiograph. IMPRESSION: 1. Unchanged elevation of right hemidiaphragm. Patient with known right pulmonary mass and pleural effusion, evaluated on PET yesterday. 2. Bronchial thickening. Electronically Signed   By: Keith Rake M.D.   On: 06/04/2021 16:31   NM PET Image Initial (PI) Skull Base To Thigh  Result Date: 06/04/2021 CLINICAL DATA:  Initial treatment strategy for lung nodule. EXAM: NUCLEAR MEDICINE PET SKULL BASE TO THIGH TECHNIQUE: 12.9 mCi F-18 FDG was injected intravenously. Full-ring PET imaging was performed from the skull base to thigh after the radiotracer. CT data was obtained and used for attenuation correction and anatomic localization. Fasting blood glucose: 115 mg/dl COMPARISON:  Chest CTA dated May 25, 2021 FINDINGS: Mediastinal blood pool activity: SUV max 3.1 Liver activity: SUV max 3.8. NECK: Hypermetabolic right supraclavicular lymph node measuring 7 mm in short axis on image number 39 with an SUV max of 6.8. Incidental CT findings: none CHEST: Hypermetabolic mass centered in the right upper lobe, difficult to measure, but approximately 9.4 x 9.4 cm with an SUV max of 23. Hypermetabolic right paratracheal, subcarinal and bilateral hilar lymph nodes. Reference subcarinal lymph node measures approximately 2.0 cm in short axis with an SUV max of 11.2. Reference right hilar lymph node measures 1.5 cm in short axis on image 69 with an SUV max of 15.2. Reference left hilar lymph node measures approximately 1.4 cm in short axis with an SUV max of 10.4. Small loculated right pleural effusion with pleural FDG uptake, SUV max of 6.3. Incidental CT findings: No pericardial effusion. Calcifications of the LAD and circumflex. Atherosclerotic disease of the thoracic aorta. ABDOMEN/PELVIS: No abnormal hypermetabolic activity within the liver, pancreas, adrenal glands, or spleen. No hypermetabolic lymph nodes in the abdomen or pelvis. Incidental CT findings: Cholelithiasis and sigmoid diverticula. SKELETON: Scattered foci of osseous hypermetabolic activity which correlate with subtle lytic lesions in the right iliac bone, T12 vertebral body, lateral sixth rib, and left scapula. Reference right iliac lesion  demonstrates an SUV max of 8.2. Incidental CT findings: none IMPRESSION: Large hypermetabolic mass centered in the right middle lobe which occludes the right middle lobe bronchi. Small loculated pleural effusion with pleural FDG uptake, compatible with malignant effusion. Hypermetabolic right supraclavicular, right paratracheal, subcarinal, and bilateral hilar lymph nodes. Scattered foci of osseous hypermetabolic activity which correlate with subtle lytic lesions, compatible with osseous metastatic disease. Aortic Atherosclerosis (ICD10-I70.0). Electronically Signed   By: Yetta Glassman M.D.   On: 06/04/2021 16:20     EKG: Independently reviewed, with result as described above.    Assessment/Plan   Kevin Hobbs is a 80 y.o. male with medical history significant for recently diagnosed acute pulmonary embolism, right pleural effusion, right middle lobe  lung mass, recently diagnosed acute left lower extremity DVT, type 2 diabetes mellitus, who is admitted to [facility] on 06/04/2021 with acute on chronic hypoxic respiratory failure after presenting from home to [facility] ED complaining of shortness of breath.    Principal Problem:   Acute on chronic respiratory failure with hypoxia (HCC) Active Problems:   Pulmonary embolism (HCC)   Pleural effusion   SOB (shortness of breath)   Diabetes mellitus without complication (HCC)   Essential hypertension   Hyperkalemia   GERD (gastroesophageal reflux disease)    #) Acute on chronic hypoxic respiratory failure: In the setting of recently established baseline supplemental oxygen requirement of 2 L continuous nasal cannula, the patient presents with 3 to 4 days of progressive shortness of breath associated with increased work of breathing, and increasing supplemental oxygen requirements, now requiring 3 to 5 L nasal cannula in order to maintain attain oxygen saturations in the mid 90s.  Suspect that this is multifactorial in its exacerbation with  suspected contributions from reaccumulation of right pleural effusion following thoracentesis on 05/26/2021. Will review associated pleural fluid analysis, although this was suspected to be an exudative process as a consequence of a malignant pleural effusion.  Differential also includes underlying pneumonia, clinically possibility of postobstructive pneumonia in the setting of right middle lobe lung mass.  While SIRS criteria for sepsis are not currently met, can consider pursuing CT chest to further assess for any element of underlying pneumonia versus heart failure.  Of note, the patient was recently diagnosed with acute pulmonary embolism and reports good compliance in taking his Eliquis upon discharge.  Additionally, there is suspicion for previously undiagnosed COPD given patient's 20-30-pack-year history of smoking, with chest x-ray evidence of bronchial thickening in the context of the above presentation concerning for potential acute COPD exacerbation in the setting of physiologic / pulmonary stress stemming from lung mass and reaccumulation of pleural effusion as well as recently diagnosed acute pulmonary embolism. Will resume home Symbicort and Spiriva, which were new medications upon discharge from the hospital, although the patient reports that he has not yet started either these medications.  In this context, will also initiate Solu-Medrol and start azithromycin for anti-inflammatory benefits.    Plan: Monitor continuous pulse oximetry.  Check serum magnesium and phosphorus levels.  We will follow for result of trending of serial troponin.  Monitor on telemetry.  Symbicort and Spiriva, as above.  As needed albuterol nebulizer ordered.  Solu-Medrol, as above.  Add azithromycin.  Check procalcitonin.  Repeat CBC in the morning.  Consider interventional radiology consult in the morning for therapeutic thoracentesis given rapid reaccumulation of right pleural effusion associated with timing of worsening  of his shortness of breath. Will attempt additional chart review to review pleural fluid analysis from recent diagnostic/therapeutic thoracentesis performed by IR on 05/26/2021 continue home Eliquis.  ABG in the morning.      #) Acute pulmonary embolism: Recently diagnosed during hospitalization 1 week ago, with patient reporting good compliance with Eliquis that was initiated at that time.   Plan: Continue home Eliquis.      #) Hyperkalemia: Mildly elevated serum potassium level of 5.9.  Potential contribution from extracellular shift in the context of initial labs suggesting nonanion gap metabolic acidosis, with potential pharmacologic exacerbation in the setting of home losartan.  EKG without associated overt hyperkalemic changes.  Status post dose of Lokelma in the ED.  Considered providing dose of calcium gluconate for stabilization of myocardium.  However, I do  not have serum calcium or serum magnesium results at this time, and will therefore refrain from provision of calcium gluconate pending results of CMP and serum magnesium level which have been ordered to be checked this evening, including for the purpose of close interval trending of serum potassium level in response to [facility].  Plan: Check CMP at 2100 this evening.  Add on serum magnesium level.  Monitor on telemetry.  Repeat CMP in the morning.  Hold home losartan.  Hold metformin.  Follow for results of blood gas is component of evaluation of suspected mild acid-base disturbance.      #) Acute kidney injury: Recently diagnosed with AKI, with creatinine of 1.83 on 05/25/2021 relative to baseline creatinine of 1.1, with creatinine noted to be 1.13 on 05/04/2021.  AKI appears to be improving, although renal function I have back to baseline, as further quantified above.  Plan: Monitor strict I's and O's and daily weights.  Tempt avoid nephrotoxic agents.  Hold home losartan.  CMP in the morning.      #) Type 2 diabetes mellitus:  On metformin as an outpatient.  Not on any exogenous insulin at home.  Presenting blood sugar noted to be 112.  Plan: Hold home metformin for now.  Accu-Cheks before every meal and at bedtime with low-dose sliding scale insulin.      #) Essential hypertension: On losartan as well as Norvasc as an outpatient.  Blood pressures in the ED noted to be normotensive.   Plan: We will hold losartan for now in the setting of acute kidney injury and hyperkalemia.  We will also hold home Norvasc for now, with plan to reevaluate interval blood pressures overnight to assist with guiding timing of resumption of home Norvasc.  Close monitoring of ensuing blood pressure via routine vital signs.     #) GERD: Documented history of such, on Protonix as an outpatient.  Plan: Continue PPI.    DVT prophylaxis: Eliquis Code Status: Full code Family Communication: none Disposition Plan: Per Rounding Team Consults called: none;  Admission status: Inpatient     Of note, this patient was added by me to the following Admit List/Treatment Team: [email or web address].      PLEASE NOTE THAT DRAGON DICTATION SOFTWARE WAS USED IN THE CONSTRUCTION OF THIS NOTE.   Tees Toh [facility] Pager [phone] From [place]  Otherwise, please contact night-coverage  [email or web address] Password [email or web address]   06/04/2021, 7:02 PM

## 2021-06-05 ENCOUNTER — Inpatient Hospital Stay (HOSPITAL_COMMUNITY): Payer: PPO

## 2021-06-05 ENCOUNTER — Other Ambulatory Visit: Payer: Self-pay

## 2021-06-05 DIAGNOSIS — R0602 Shortness of breath: Secondary | ICD-10-CM | POA: Diagnosis present

## 2021-06-05 DIAGNOSIS — E875 Hyperkalemia: Secondary | ICD-10-CM | POA: Diagnosis present

## 2021-06-05 DIAGNOSIS — J9621 Acute and chronic respiratory failure with hypoxia: Secondary | ICD-10-CM

## 2021-06-05 DIAGNOSIS — K219 Gastro-esophageal reflux disease without esophagitis: Secondary | ICD-10-CM | POA: Diagnosis present

## 2021-06-05 DIAGNOSIS — J90 Pleural effusion, not elsewhere classified: Secondary | ICD-10-CM | POA: Diagnosis present

## 2021-06-05 DIAGNOSIS — E119 Type 2 diabetes mellitus without complications: Secondary | ICD-10-CM

## 2021-06-05 DIAGNOSIS — I10 Essential (primary) hypertension: Secondary | ICD-10-CM | POA: Diagnosis present

## 2021-06-05 LAB — BLOOD GAS, ARTERIAL
Acid-base deficit: 6.2 mmol/L — ABNORMAL HIGH (ref 0.0–2.0)
Bicarbonate: 17.6 mmol/L — ABNORMAL LOW (ref 20.0–28.0)
FIO2: 36
O2 Saturation: 95.5 %
Patient temperature: 98.8
pCO2 arterial: 32.2 mmHg (ref 32.0–48.0)
pH, Arterial: 7.357 (ref 7.350–7.450)
pO2, Arterial: 87.3 mmHg (ref 83.0–108.0)

## 2021-06-05 LAB — COMPREHENSIVE METABOLIC PANEL
ALT: 100 U/L — ABNORMAL HIGH (ref 0–44)
AST: 219 U/L — ABNORMAL HIGH (ref 15–41)
Albumin: 2.1 g/dL — ABNORMAL LOW (ref 3.5–5.0)
Alkaline Phosphatase: 150 U/L — ABNORMAL HIGH (ref 38–126)
Anion gap: 8 (ref 5–15)
BUN: 61 mg/dL — ABNORMAL HIGH (ref 8–23)
CO2: 20 mmol/L — ABNORMAL LOW (ref 22–32)
Calcium: 8.8 mg/dL — ABNORMAL LOW (ref 8.9–10.3)
Chloride: 109 mmol/L (ref 98–111)
Creatinine, Ser: 1.47 mg/dL — ABNORMAL HIGH (ref 0.61–1.24)
GFR, Estimated: 49 mL/min — ABNORMAL LOW (ref >60)
Glucose, Bld: 149 mg/dL — ABNORMAL HIGH (ref 70–99)
Potassium: 5.6 mmol/L — ABNORMAL HIGH (ref 3.5–5.1)
Sodium: 137 mmol/L (ref 135–145)
Total Bilirubin: 1.9 mg/dL — ABNORMAL HIGH (ref 0.3–1.2)
Total Protein: 7.5 g/dL (ref 6.5–8.1)

## 2021-06-05 LAB — CBC WITH DIFFERENTIAL/PLATELET
Abs Immature Granulocytes: 0.28 10*3/uL — ABNORMAL HIGH (ref 0.00–0.07)
Basophils Absolute: 0.1 10*3/uL (ref 0.0–0.1)
Basophils Relative: 0 %
Eosinophils Absolute: 0 10*3/uL (ref 0.0–0.5)
Eosinophils Relative: 0 %
HCT: 29.6 % — ABNORMAL LOW (ref 39.0–52.0)
Hemoglobin: 9.5 g/dL — ABNORMAL LOW (ref 13.0–17.0)
Immature Granulocytes: 2 %
Lymphocytes Relative: 6 %
Lymphs Abs: 0.7 10*3/uL (ref 0.7–4.0)
MCH: 28.1 pg (ref 26.0–34.0)
MCHC: 32.1 g/dL (ref 30.0–36.0)
MCV: 87.6 fL (ref 80.0–100.0)
Monocytes Absolute: 0.2 10*3/uL (ref 0.1–1.0)
Monocytes Relative: 2 %
Neutro Abs: 10.8 10*3/uL — ABNORMAL HIGH (ref 1.7–7.7)
Neutrophils Relative %: 90 %
Platelets: 602 10*3/uL — ABNORMAL HIGH (ref 150–400)
RBC: 3.38 MIL/uL — ABNORMAL LOW (ref 4.22–5.81)
RDW: 16 % — ABNORMAL HIGH (ref 11.5–15.5)
WBC: 12.1 10*3/uL — ABNORMAL HIGH (ref 4.0–10.5)
nRBC: 0 % (ref 0.0–0.2)

## 2021-06-05 LAB — GLUCOSE, CAPILLARY
Glucose-Capillary: 158 mg/dL — ABNORMAL HIGH (ref 70–99)
Glucose-Capillary: 177 mg/dL — ABNORMAL HIGH (ref 70–99)
Glucose-Capillary: 202 mg/dL — ABNORMAL HIGH (ref 70–99)
Glucose-Capillary: 194 mg/dL — ABNORMAL HIGH (ref 70–99)

## 2021-06-05 LAB — MRSA NEXT GEN BY PCR, NASAL: MRSA by PCR Next Gen: NOT DETECTED

## 2021-06-05 LAB — PROCALCITONIN: Procalcitonin: 0.64 ng/mL

## 2021-06-05 LAB — POTASSIUM: Potassium: 4.8 mmol/L (ref 3.5–5.1)

## 2021-06-05 LAB — PHOSPHORUS: Phosphorus: 5 mg/dL — ABNORMAL HIGH (ref 2.5–4.6)

## 2021-06-05 LAB — SARS CORONAVIRUS 2 (TAT 6-24 HRS): SARS Coronavirus 2: NEGATIVE

## 2021-06-05 LAB — MAGNESIUM: Magnesium: 2.2 mg/dL (ref 1.7–2.4)

## 2021-06-05 MED ORDER — PANTOPRAZOLE SODIUM 40 MG PO TBEC
40.0000 mg | DELAYED_RELEASE_TABLET | Freq: Every day | ORAL | Status: DC
  Administered 2021-06-05 – 2021-06-18 (×14): 40 mg via ORAL
  Filled 2021-06-05 (×14): qty 1

## 2021-06-05 MED ORDER — PREDNISONE 20 MG PO TABS
40.0000 mg | ORAL_TABLET | Freq: Every day | ORAL | Status: AC
  Administered 2021-06-06 – 2021-06-09 (×4): 40 mg via ORAL
  Filled 2021-06-05 (×4): qty 2

## 2021-06-05 MED ORDER — SODIUM CHLORIDE 0.9 % IV SOLN
500.0000 mg | INTRAVENOUS | Status: DC
  Administered 2021-06-05 – 2021-06-07 (×3): 500 mg via INTRAVENOUS
  Filled 2021-06-05 (×3): qty 500

## 2021-06-05 MED ORDER — FUROSEMIDE 10 MG/ML IJ SOLN
40.0000 mg | Freq: Once | INTRAMUSCULAR | Status: AC
  Administered 2021-06-06: 40 mg via INTRAVENOUS
  Filled 2021-06-05: qty 4

## 2021-06-05 MED ORDER — ENSURE ENLIVE PO LIQD
237.0000 mL | Freq: Two times a day (BID) | ORAL | Status: DC
  Administered 2021-06-06 – 2021-06-18 (×22): 237 mL via ORAL

## 2021-06-05 MED ORDER — UMECLIDINIUM BROMIDE 62.5 MCG/INH IN AEPB
1.0000 | INHALATION_SPRAY | Freq: Every day | RESPIRATORY_TRACT | Status: DC
  Administered 2021-06-05 – 2021-06-10 (×6): 1 via RESPIRATORY_TRACT
  Filled 2021-06-05 (×2): qty 7

## 2021-06-05 MED ORDER — LIDOCAINE HCL 1 % IJ SOLN
INTRAMUSCULAR | Status: AC
  Filled 2021-06-05: qty 20

## 2021-06-05 MED ORDER — SODIUM ZIRCONIUM CYCLOSILICATE 10 G PO PACK
10.0000 g | PACK | Freq: Once | ORAL | Status: AC
  Administered 2021-06-05: 10 g via ORAL
  Filled 2021-06-05: qty 1

## 2021-06-05 MED ORDER — CHLORHEXIDINE GLUCONATE CLOTH 2 % EX PADS
6.0000 | MEDICATED_PAD | Freq: Every day | CUTANEOUS | Status: DC
  Administered 2021-06-05 – 2021-06-09 (×3): 6 via TOPICAL

## 2021-06-05 MED ORDER — ORAL CARE MOUTH RINSE
15.0000 mL | Freq: Two times a day (BID) | OROMUCOSAL | Status: DC
  Administered 2021-06-05 – 2021-06-18 (×25): 15 mL via OROMUCOSAL

## 2021-06-05 MED ORDER — INSULIN ASPART 100 UNIT/ML IJ SOLN
0.0000 [IU] | Freq: Three times a day (TID) | INTRAMUSCULAR | Status: DC
  Administered 2021-06-05: 2 [IU] via SUBCUTANEOUS
  Administered 2021-06-05: 3 [IU] via SUBCUTANEOUS
  Administered 2021-06-05: 2 [IU] via SUBCUTANEOUS
  Administered 2021-06-06: 5 [IU] via SUBCUTANEOUS
  Administered 2021-06-06: 2 [IU] via SUBCUTANEOUS
  Administered 2021-06-06: 1 [IU] via SUBCUTANEOUS
  Administered 2021-06-07 (×2): 2 [IU] via SUBCUTANEOUS
  Administered 2021-06-08: 3 [IU] via SUBCUTANEOUS
  Administered 2021-06-08 – 2021-06-09 (×2): 2 [IU] via SUBCUTANEOUS
  Administered 2021-06-09: 7 [IU] via SUBCUTANEOUS
  Filled 2021-06-05: qty 0.09

## 2021-06-05 MED ORDER — SIMVASTATIN 20 MG PO TABS
10.0000 mg | ORAL_TABLET | Freq: Every day | ORAL | Status: DC
  Administered 2021-06-05 – 2021-06-18 (×14): 10 mg via ORAL
  Filled 2021-06-05 (×14): qty 1

## 2021-06-05 MED ORDER — ADULT MULTIVITAMIN W/MINERALS CH
1.0000 | ORAL_TABLET | Freq: Every day | ORAL | Status: DC
  Administered 2021-06-05 – 2021-06-18 (×14): 1 via ORAL
  Filled 2021-06-05 (×14): qty 1

## 2021-06-05 MED ORDER — TIOTROPIUM BROMIDE MONOHYDRATE 18 MCG IN CAPS: 18.0000 ug | ORAL_CAPSULE | Freq: Every day | RESPIRATORY_TRACT | Status: DC

## 2021-06-05 MED ORDER — FUROSEMIDE 10 MG/ML IJ SOLN: 40.0000 mg | Freq: Once | INTRAMUSCULAR | Status: DC

## 2021-06-05 NOTE — Progress Notes (Signed)
Initial Nutrition Assessment  DOCUMENTATION CODES:  Obesity unspecified  INTERVENTION:  -Ensure Enlive po BID, each supplement provides 350 kcal and 20 grams of protein -MVI with minerals daily  NUTRITION DIAGNOSIS:  Inadequate oral intake related to decreased appetite as evidenced by per patient/family report.  GOAL:  Patient will meet greater than or equal to 90% of their needs  MONITOR:  PO intake, Supplement acceptance, Weight trends, Labs, I & O's  REASON FOR ASSESSMENT:  Malnutrition Screening Tool    ASSESSMENT:  Pt with PMH significant for recently diagnosed acute pulmonary embolism, R pleural effusion, R middle lobe lung mass, recently diagnosed acute L lower extremity DVT, and type 2 DM admitted with acute on chronic hypoxic respiratory failure.  Pt unavailable at time of RD visit. Per RN, pt undergoing US-guided diagnostic and therapeutic R thoracentesis w/ f/u CXR.   Per weight readings, pt weighed 118.8kg on 05/25/21 and now weighs 107 kg. If these weights are accurate, pt has experienced clinically significant 9.9% weight loss in <1 month.   No PO intake documented.  Medications: SSI, solu-medrol, protonix Labs: K+ 5.6 (H), BUN 61 (H), Cr 1.47 (H), PO4 5.0 (H), Alkaline phosphatase 150 (H), elevated LFTs CBGs 158-177  UOP: 1033ml x24 hours I/O: -273ml since admit  NUTRITION - FOCUSED PHYSICAL EXAM: Unable to perform at this time; will attempt at follow-up.   Diet Order:   Diet Order             Diet regular Room service appropriate? Yes; Fluid consistency: Thin  Diet effective now                  EDUCATION NEEDS:  No education needs have been identified at this time  Skin:  Skin Assessment: Reviewed RN Assessment  Last BM:  PTA  Height:  Ht Readings from Last 1 Encounters:  06/05/21 5\' 7"  (1.702 m)   Weight:  Wt Readings from Last 3 Encounters:  06/05/21 107 kg  05/25/21 118.8 kg   BMI:  Body mass index is 36.95 kg/m.  Estimated  Nutritional Needs:  Kcal:  2150-2350 Protein:  125-135 grams Fluid:  >2L    Larkin Ina, MS, RD, LDN (she/her/hers) RD pager number and weekend/on-call pager number located in [place].

## 2021-06-05 NOTE — Progress Notes (Signed)
[place]  ERD:[id number] DOB: June 08, 1941 DOA: 06/04/2021 PCP: Robyne Peers, MD    Brief Narrative:  This 80 years old male with PMH significant for recently diagnosed acute pulmonary embolism, right pleural effusion and right middle lobe lung mass ,  type 2 diabetes presented in the ED with acute on chronic hypoxic respiratory failure. Patient was recently hospitalized at [facility] for acute hypoxic respiratory failure from 8/23-8/25.  Work-up during that hospitalization.  CTA chest showed evidence of acute PE, right pleural effusion right middle lobe lung mass.  Pulmonology consulted patient underwent thoracocentesis with removal of 1.3 L of pleural fluid blood-tinged in nature, patient was subsequently discharged on 2 L of supplemental oxygen and Eliquis.  Patient had outpatient PET scan . Patient reports worsening shortness of breath after he is discharged home. Repeat CT scan patient has recurrence of pleural effusion.  IR consulted patient underwent thoracocentesis. Pulmonology and oncology consulted.  Assessment & Plan:   Principal Problem:   Acute on chronic respiratory failure with hypoxia (HCC) Active Problems:   Pulmonary embolism (HCC)   Pleural effusion   SOB (shortness of breath)   Diabetes mellitus without complication (HCC)   Essential hypertension   Hyperkalemia   GERD (gastroesophageal reflux disease)  Acute on chronic hypoxic respiratory failure: Patient was recently discharged home on supplemental oxygen after being diagnosed with lung mass.  Presented with progressive shortness of breath for 3 to 4 days.  Now requiring 4 to 5 L of supplemental oxygen to maintain saturation above 90%.  This could be due to reaccumulation of effusion following thoracocentesis. This could be postobstructive pneumonia in the setting of right middle lobe lung mass. Continue supplemental oxygen to keep saturation above 90%. IR consulted patient underwent  successful thoracocentesis.   20 cc of amber-colored fluid drained and sent for labs. Continue Zithromax, albuterol inhaler , Solu-Medrol. Continue to monitor clinical status.  Acute pulmonary embolism: Continue Eliquis  Hyperkalemia: EKG without changes, Lokelma given. Hold losartan.  Continue to monitor.  Acute kidney injury: Baseline serum creatinine 1.1, presented with 1.  At 3. Avoid nephrotoxic medications.  Recheck a.m. labs.  Diabetes: Hold metformin, regular insulin sliding scale  Essential hypertension: Hold losartan, monitor blood pressure  GERD: Continue PPI    DVT prophylaxis:  Code Status: Full code Family Communication: Wife at bedside Disposition Plan:   Status is: Inpatient  Remains inpatient appropriate because:Inpatient level of care appropriate due to severity of illness  Dispo: The patient is from: Home              Anticipated d/c is to: Home              Patient currently is not medically stable to d/c.   Difficult to place patient No  Consultants:  Pulmonology and oncology.  Procedures: Thoracocentesis by IR Antimicrobials:  Anti-infectives (From admission, onward)    Start     Dose/Rate Route Frequency Ordered Stop   06/05/21 0300  azithromycin (ZITHROMAX) 500 mg in sodium chloride 0.9 % 250 mL IVPB        500 mg 250 mL/hr over 60 Minutes Intravenous Every 24 hours 06/05/21 0231          Subjective: Patient was seen and examined at bedside.  Overnight events noted.   Patient reported he is 80% feeling better.  He is down to 3 L/min sats 94%.  Objective: Vitals:   06/05/21 1200 06/05/21 1215 06/05/21 1300 06/05/21 1400  BP: 127/70 (!) 160/58 138/64 (!) 146/61  Pulse:   91 83  Resp:   (!) 31 (!) 27  Temp:      TempSrc:      SpO2:   93% 95%  Weight:      Height:        Intake/Output Summary (Last 24 hours) at 06/05/2021 1613 Last data filed at 06/05/2021 1400 Gross per 24 hour  Intake 805.05 ml  Output 1050 ml  Net  -244.95 ml   Filed Weights   06/05/21 0900  Weight: 107 kg    Examination:  General exam: Comfortable, not in any acute distress.  Appears deconditioned. Respiratory system: Clear to auscultation bilaterally, respiratory effort normal. Cardiovascular system: S1, S2 heard, regular rate and rhythm.  No murmur Gastrointestinal system: Abdomen is soft, nontender, nondistended, BS+ Central nervous system: Alert and oriented. No focal neurological deficits. Extremities: No edema, no cyanosis, no clubbing. Skin: No rashes, lesions or ulcers Psychiatry: Judgement and insight appear normal. Mood & affect appropriate.     Data Reviewed: I have personally reviewed following labs and imaging studies  CBC: Recent Labs  Lab 06/04/21 1635 06/05/21 0430  WBC 14.6* 12.1*  NEUTROABS 10.9* 10.8*  HGB 10.2* 9.5*  HCT 32.2* 29.6*  MCV 89.0 87.6  PLT 609* 956*   Basic Metabolic Panel: Recent Labs  Lab 06/04/21 1635 06/04/21 1841 06/04/21 2146 06/05/21 0430  NA 135  --  136 137  K 5.9*  --  5.3* 5.6*  CL 105  --  107 109  CO2 20*  --  20* 20*  GLUCOSE 112*  --  99 149*  BUN 63*  --  61* 61*  CREATININE 1.55*  --  1.49* 1.47*  CALCIUM 9.1  --  8.7* 8.8*  MG  --  1.9  --  2.2  PHOS  --   --   --  5.0*   GFR: Estimated Creatinine Clearance: 48.3 mL/min (A) (by C-G formula based on SCr of 1.47 mg/dL (H)). Liver Function Tests: Recent Labs  Lab 06/04/21 2146 06/05/21 0430  AST 164* 219*  ALT 91* 100*  ALKPHOS 145* 150*  BILITOT 2.5* 1.9*  PROT 7.3 7.5  ALBUMIN 2.2* 2.1*   No results for input(s): LIPASE, AMYLASE in the last 168 hours. No results for input(s): AMMONIA in the last 168 hours. Coagulation Profile: No results for input(s): INR, PROTIME in the last 168 hours. Cardiac Enzymes: No results for input(s): CKTOTAL, CKMB, CKMBINDEX, TROPONINI in the last 168 hours. BNP (last 3 results) No results for input(s): PROBNP in the last 8760 hours. HbA1C: No results for  input(s): HGBA1C in the last 72 hours. CBG: Recent Labs  Lab 06/03/21 0928 06/05/21 0901 06/05/21 1102  GLUCAP 115* 158* 177*   Lipid Profile: No results for input(s): CHOL, HDL, LDLCALC, TRIG, CHOLHDL, LDLDIRECT in the last 72 hours. Thyroid Function Tests: No results for input(s): TSH, T4TOTAL, FREET4, T3FREE, THYROIDAB in the last 72 hours. Anemia Panel: No results for input(s): VITAMINB12, FOLATE, FERRITIN, TIBC, IRON, RETICCTPCT in the last 72 hours. Sepsis Labs: Recent Labs  Lab 06/05/21 0430  PROCALCITON 0.64    Recent Results (from the past 240 hour(s))  SARS CORONAVIRUS 2 (TAT 6-24 HRS) Nasopharyngeal Nasopharyngeal Swab     Status: None   Collection Time: 06/04/21  3:32 PM   Specimen: Nasopharyngeal Swab  Result Value Ref Range Status   SARS Coronavirus 2 NEGATIVE NEGATIVE Final    Comment: (NOTE) SARS-CoV-2 target nucleic acids  are NOT DETECTED.  The SARS-CoV-2 RNA is generally detectable in upper and lower respiratory specimens during the acute phase of infection. Negative results do not preclude SARS-CoV-2 infection, do not rule out co-infections with other pathogens, and should not be used as the sole basis for treatment or other patient management decisions. Negative results must be combined with clinical observations, patient history, and epidemiological information. The expected result is Negative.  Fact Sheet for Patients: SugarRoll.be  Fact Sheet for Healthcare Providers: https://www.woods-mathews.com/  This test is not yet approved or cleared by the [place] FDA and  has been authorized for detection and/or diagnosis of SARS-CoV-2 by FDA under an Emergency Use Authorization (EUA). This EUA will remain  in effect (meaning this test can be used) for the duration of the COVID-19 declaration under Se ction 564(b)(1) of the Act, 21 U.S.C. section 360bbb-3(b)(1), unless the authorization is terminated  or revoked sooner.  Performed at [facility], [place] [address], [place], [place] [address]   MRSA Next Gen by PCR, Nasal     Status: None   Collection Time: 06/05/21  8:50 AM   Specimen: Nasal Mucosa; Nasal Swab  Result Value Ref Range Status   MRSA by PCR Next Gen NOT DETECTED NOT DETECTED Final    Comment: (NOTE) The GeneXpert MRSA Assay (FDA approved for NASAL specimens only), is one component of a comprehensive MRSA colonization surveillance program. It is not intended to diagnose MRSA infection nor to guide or monitor treatment for MRSA infections. Test performance is not FDA approved in patients less than 3 years old. Performed at [facility], [place] [address], [place],  [address]     Radiology Studies: DG Chest 1 View  Result Date: 06/05/2021 CLINICAL DATA:  Status post thoracentesis. EXAM: CHEST  1 VIEW COMPARISON:  Chest radiograph dated 06/04/2021. FINDINGS: The heart is obscured. A moderate right pleural effusion with associated atelectasis/consolidation appears unchanged. There is no right pneumothorax. The left lung is clear without pleural effusion or pneumothorax. IMPRESSION: No significant change in a moderate right pleural effusion with associated atelectasis/consolidation. No pneumothorax. Electronically Signed   By: Zerita Boers M.D.   On: 06/05/2021 12:52   DG Chest 2 View  Result Date: 06/04/2021 CLINICAL DATA:  Shortness of breath.  History of lung cancer. EXAM: CHEST - 2 VIEW COMPARISON:  Radiograph 05/27/2021.  PET CT yesterday. FINDINGS: Again seen elevation of right hemidiaphragm. Right pleural effusion and right pulmonary mass seen on PET CT yesterday. Stable heart size and mediastinal contours. Peribronchial thickening. No pneumothorax. No significant left pleural effusion. No acute osseous abnormalities are seen. Known lytic lesions on prior PET are not seen by radiograph. IMPRESSION: 1. Unchanged elevation of right hemidiaphragm.  Patient with known right pulmonary mass and pleural effusion, evaluated on PET yesterday. 2. Bronchial thickening. Electronically Signed   By: Keith Rake M.D.   On: 06/04/2021 16:31   US THORACENTESIS ASP PLEURAL SPACE W/IMG GUIDE  Result Date: 06/05/2021 INDICATION: Patient with history of lung cancer, pulmonary embolus, malignant right pleural effusion. Request received for diagnostic and therapeutic right thoracentesis. EXAM: ULTRASOUND GUIDED DIAGNOSTIC AND THERAPEUTIC RIGHT THORACENTESIS MEDICATIONS: 1% lidocaine to skin and subcutaneous tissue COMPLICATIONS: None immediate. PROCEDURE: An ultrasound guided thoracentesis was thoroughly discussed with the patient and questions answered. The benefits, risks, alternatives and complications were also discussed. The patient understands and wishes to proceed with the procedure. Written consent was obtained. Ultrasound was performed to localize and mark an adequate pocket of  fluid in the right chest. The area was then prepped and draped in the normal sterile fashion. 1% Lidocaine was used for local anesthesia. Under ultrasound guidance a 6 Fr Safe-T-Centesis catheter was introduced. Thoracentesis was performed. The catheter was removed and a dressing applied. FINDINGS: A total of approximately 20 cc of amber fluid was removed. Samples were sent to the laboratory as requested by the clinical team. Minimal free fluid was noted on today's ultrasound of right pleural space. Despite catheter manipulation only the above amount of fluid could be removed today. IMPRESSION: Successful ultrasound guided diagnostic and therapeutic right thoracentesis yielding 20 cc of pleural fluid. Read by: Rowe Robert, PA-C Electronically Signed   By: Aletta Edouard M.D.   On: 06/05/2021 12:39    Scheduled Meds:  apixaban  5 mg Oral BID   Chlorhexidine Gluconate Cloth  6 each Topical Daily   [START ON 06/06/2021] feeding supplement  237 mL Oral BID BM   insulin aspart  0-9 Units  Subcutaneous TID WC   mouth rinse  15 mL Mouth Rinse BID   methylPREDNISolone (SOLU-MEDROL) injection  80 mg Intravenous Q12H   mometasone-formoterol  2 puff Inhalation BID   multivitamin with minerals  1 tablet Oral Daily   pantoprazole  40 mg Oral Daily   simvastatin  10 mg Oral Daily   umeclidinium bromide  1 puff Inhalation Daily   Continuous Infusions:  azithromycin Stopped (06/05/21 0411)     LOS: 1 day    Time spent: 35 mins    Marcella Dunnaway, MD [facility]   If 7PM-7AM, please contact night-coverage

## 2021-06-05 NOTE — ED Notes (Signed)
Pt moved to hospital bed for comfort. While doing so, it was noted pt's condom cath had come off. Condom cath replaced. Pt now resting comfortably.

## 2021-06-05 NOTE — Procedures (Signed)
Ultrasound-guided diagnostic and therapeutic right thoracentesis performed yielding 20 cc of amber fluid. No immediate complications. Follow-up chest x-ray pending. The fluid was sent to the lab for preordered studies. Minimal free fluid noted in rt pleural space on today's [place]. Despite cath manipulation only the above amount of fluid could be aspirated . EBL< 2 cc.

## 2021-06-05 NOTE — Consult Note (Signed)
NAME:  Kevin Hobbs, MRN:  [id number], DOB:  1941/05/14, LOS: 1 ADMISSION DATE:  06/04/2021, CONSULTATION DATE:  06/05/21 REFERRING MD:  Evelina Dun, CHIEF COMPLAINT:  DOE   History of Present Illness:  80 year old whom we are seeing in consultation for evaluation of acute on chronic hypoxemic respiratory failure.  Baseline 2 L nasal cannula.  Patient had some mild acute worsening of his baseline shortness of breath over the last few days.  Some cough, some congestion.  Little bit of wheezing.  Shortness of breath worse when lying flat.  Also worse with exertion, on inclines or stairs and is moving around the house.  No times a day with things are better or worse.  No other seasonal environmental factors he can identify.  Reports good adherence to his blood thinner.  States has not been using his inhalers as well as he should be.  He presented to the ED.  Chest x-ray reviewed and shows chronic right-sided hemidiaphragm elevation/mass as well as increased interstitial markings felt to be related to pulmonary edema on my interpretation.  He was elevated to nearly 500 from normal a couple weeks prior.  Labs relatively stable with the peers to be new baseline creatinine mid ones.  He was admitted to hospital.  He is given high-dose steroids.  Started on azithromycin.  He got 1 dose of Lasix 40 mg IV.  States breathing is better.  Down to 3 L at time of evaluation.  I turned him down to 2 without ill effect.  Goal sat 88%.  Had thoracentesis today with only 20 cc of fluid removed.  Not surprising given small size on recent PET scan.  Pertinent  Medical History  Recent diagnosis metastatic lung cancer with positive cytology of pleural effusion Significant Hospital Events: Including procedures, antibiotic start and stop dates in addition to other pertinent events   9/2 admitted to hospital worsening shortness of breath, mild increase in hypoxemic respiratory failure up to 6 L but quickly weaned  Interim  History / Subjective:    Objective   Blood pressure (!) 146/61, pulse 83, temperature 98.4 F (36.9 C), temperature source Axillary, resp. rate (!) 27, height 5\' 7"  (1.702 m), weight 107 kg, SpO2 95 %.        Intake/Output Summary (Last 24 hours) at 06/05/2021 1645 Last data filed at 06/05/2021 1400 Gross per 24 hour  Intake 805.05 ml  Output 1050 ml  Net -244.95 ml   Filed Weights   06/05/21 0900  Weight: 107 kg    Examination: General: Lying in bed, in no acute distress Eyes: EOMI, icterus Neck: Supple, mild elevation of JVP Pulmonary: Clear, no wheeze, normal work of breathing on 2 L Cardiovascular: Regular rate and rhythm, no murmur Abdomen: Rotund, soft , bowel sounds present MSK: No synovitis, no joint effusion    Resolved Hospital Problem list     Assessment & Plan:  Acute on chronic hypoxemic respiratory failure: Reportedly on 5 to 6 L in the ED compared to baseline 2 L when discharged couple weeks ago.  Chest x-ray looks congested, edematous to me.  Chronic right hemidiaphragm diaphragm elevation and mass.  Status postthoracentesis only 20 cc removed.  He is down to 2 L at time of my evaluation.  Breathing normally.  No wheeze on exam.  BNP markedly elevated 490 from 10 a couple weeks ago.  Endorses orthopnea.  Suspect multifactorial related to exacerbation of underlying bronchitic lung disease improving on steroids as well as exacerbated  by mild volume overload.  Baseline expected to have ongoing hypoxemia given tumor burden. --Continue bronchodilators/inhaled corticosteroids --Continue azithromycin for exacerbation --Decrease prednisone to 40 mg daily x4 days starting tomorrow from Solu-Medrol 80 mg twice daily --Given elevated BNP and stable kidney function following lasix dose 9/2, will re-order Lasix in the morning, continue as blood pressure and kidney function allows  Pulmonary medicine will sign off.  Please contact us if we can help in any way.  Best Practice  (right click and "Reselect all SmartList Selections" daily)   Per primary  Labs   CBC: Recent Labs  Lab 06/04/21 1635 06/05/21 0430  WBC 14.6* 12.1*  NEUTROABS 10.9* 10.8*  HGB 10.2* 9.5*  HCT 32.2* 29.6*  MCV 89.0 87.6  PLT 609* 602*    Basic Metabolic Panel: Recent Labs  Lab 06/04/21 1635 06/04/21 1841 06/04/21 2146 06/05/21 0430  NA 135  --  136 137  K 5.9*  --  5.3* 5.6*  CL 105  --  107 109  CO2 20*  --  20* 20*  GLUCOSE 112*  --  99 149*  BUN 63*  --  61* 61*  CREATININE 1.55*  --  1.49* 1.47*  CALCIUM 9.1  --  8.7* 8.8*  MG  --  1.9  --  2.2  PHOS  --   --   --  5.0*   GFR: Estimated Creatinine Clearance: 48.3 mL/min (A) (by C-G formula based on SCr of 1.47 mg/dL (H)). Recent Labs  Lab 06/04/21 1635 06/05/21 0430  PROCALCITON  --  0.64  WBC 14.6* 12.1*    Liver Function Tests: Recent Labs  Lab 06/04/21 2146 06/05/21 0430  AST 164* 219*  ALT 91* 100*  ALKPHOS 145* 150*  BILITOT 2.5* 1.9*  PROT 7.3 7.5  ALBUMIN 2.2* 2.1*   No results for input(s): LIPASE, AMYLASE in the last 168 hours. No results for input(s): AMMONIA in the last 168 hours.  ABG    Component Value Date/Time   PHART 7.357 06/05/2021 0412   PCO2ART 32.2 06/05/2021 0412   PO2ART 87.3 06/05/2021 0412   HCO3 17.6 (L) 06/05/2021 0412   ACIDBASEDEF 6.2 (H) 06/05/2021 0412   O2SAT 95.5 06/05/2021 0412     Coagulation Profile: No results for input(s): INR, PROTIME in the last 168 hours.  Cardiac Enzymes: No results for input(s): CKTOTAL, CKMB, CKMBINDEX, TROPONINI in the last 168 hours.  HbA1C: Hgb A1c MFr Bld  Date/Time Value Ref Range Status  05/25/2021 06:06 PM 6.8 (H) 4.8 - 5.6 % Final    Comment:    (NOTE) Pre diabetes:          5.7%-6.4%  Diabetes:              >6.4%  Glycemic control for   <7.0% adults with diabetes     CBG: Recent Labs  Lab 06/03/21 0928 06/05/21 0901 06/05/21 1102 06/05/21 1605  GLUCAP 115* 158* 177* 202*    Review of Systems:    No chest pain with exertion.  No lower extremity swelling.  Comprehensive review of systems otherwise negative.  Past Medical History:  He,  has a past medical history of Acute DVT (deep venous thrombosis) ([place]), Acute pulmonary embolism ([place]), Diabetes mellitus without complication ([place]), Hypertension, and Pleural effusion.   Surgical History:   Past Surgical History:  Procedure Laterality Date   HERNIA REPAIR       Social History:   reports that he has quit smoking. His smoking use included  cigarettes. He has never used smokeless tobacco. He reports that he does not currently use alcohol. He reports that he does not use drugs.   Family History:  His family history is not on file.   Allergies No Known Allergies   Home Medications  Prior to Admission medications   Medication Sig Start Date End Date Taking? Authorizing Provider  acetaminophen (TYLENOL) 325 MG tablet Take 2 tablets (650 mg total) by mouth every 6 (six) hours as needed for mild pain (or Fever >/= 101). 05/27/21  Yes Sheikh, Omair Latif, DO  amLODipine (NORVASC) 10 MG tablet Take 10 mg by mouth daily. 05/04/21  Yes [provider]  apixaban (ELIQUIS) 5 MG TABS tablet Take 2 tablets (10 mg total) by mouth 2 (two) times daily for 7 days, THEN 1 tablet (5 mg total) 2 (two) times daily for 28 days. 05/27/21 07/01/21 Yes Sheikh, Omair Latif, DO  budesonide-formoterol (SYMBICORT) 80-4.5 MCG/ACT inhaler Inhale 2 puffs into the lungs in the morning and at bedtime. 05/27/21  Yes Sheikh, Omair Latif, DO  clarithromycin (BIAXIN) 500 MG tablet Take 500 mg by mouth 2 (two) times daily. Start date : 05/24/21   Yes [provider]  losartan (COZAAR) 100 MG tablet Take 100 mg by mouth daily. 03/29/21  Yes [provider]  metFORMIN (GLUCOPHAGE-XR) 500 MG 24 hr tablet Take 1,000 mg by mouth daily. 02/28/21  Yes [provider]  pantoprazole (PROTONIX) 40 MG tablet Take 40 mg by mouth daily. 05/04/21  Yes  [provider]  simvastatin (ZOCOR) 10 MG tablet Take 10 mg by mouth daily. 05/04/21  Yes [provider]  sodium chloride (OCEAN) 0.65 % nasal spray Place 1 spray into the nose at bedtime.   Yes [provider]  tiotropium (SPIRIVA HANDIHALER) 18 MCG inhalation capsule Place 1 capsule (18 mcg total) into inhaler and inhale daily. 05/27/21  Yes Sheikh, Omair Latif, DO  amoxicillin (AMOXIL) 500 MG tablet Take 1,000 mg by mouth 2 (two) times daily. 05/24/21   [provider]  escitalopram (LEXAPRO) 5 MG tablet Take 5 mg by mouth daily. 06/03/21   [provider]  ondansetron (ZOFRAN) 4 MG tablet Take 1 tablet (4 mg total) by mouth every 6 (six) hours as needed for nausea. 05/27/21   Kerney Elbe, DO     Critical care time: n/a

## 2021-06-06 DIAGNOSIS — J9621 Acute and chronic respiratory failure with hypoxia: Secondary | ICD-10-CM | POA: Diagnosis not present

## 2021-06-06 LAB — CBC
HCT: 28.9 % — ABNORMAL LOW (ref 39.0–52.0)
Hemoglobin: 9.5 g/dL — ABNORMAL LOW (ref 13.0–17.0)
MCH: 28 pg (ref 26.0–34.0)
MCHC: 32.9 g/dL (ref 30.0–36.0)
MCV: 85.3 fL (ref 80.0–100.0)
Platelets: 583 10*3/uL — ABNORMAL HIGH (ref 150–400)
RBC: 3.39 MIL/uL — ABNORMAL LOW (ref 4.22–5.81)
RDW: 15.9 % — ABNORMAL HIGH (ref 11.5–15.5)
WBC: 17.6 10*3/uL — ABNORMAL HIGH (ref 4.0–10.5)
nRBC: 0 % (ref 0.0–0.2)

## 2021-06-06 LAB — GLUCOSE, CAPILLARY
Glucose-Capillary: 141 mg/dL — ABNORMAL HIGH (ref 70–99)
Glucose-Capillary: 171 mg/dL — ABNORMAL HIGH (ref 70–99)
Glucose-Capillary: 276 mg/dL — ABNORMAL HIGH (ref 70–99)
Glucose-Capillary: 184 mg/dL — ABNORMAL HIGH (ref 70–99)

## 2021-06-06 LAB — BASIC METABOLIC PANEL
Anion gap: 10 (ref 5–15)
BUN: 71 mg/dL — ABNORMAL HIGH (ref 8–23)
CO2: 20 mmol/L — ABNORMAL LOW (ref 22–32)
Calcium: 8.7 mg/dL — ABNORMAL LOW (ref 8.9–10.3)
Chloride: 104 mmol/L (ref 98–111)
Creatinine, Ser: 1.5 mg/dL — ABNORMAL HIGH (ref 0.61–1.24)
GFR, Estimated: 47 mL/min — ABNORMAL LOW (ref >60)
Glucose, Bld: 167 mg/dL — ABNORMAL HIGH (ref 70–99)
Potassium: 5 mmol/L (ref 3.5–5.1)
Sodium: 134 mmol/L — ABNORMAL LOW (ref 135–145)

## 2021-06-06 LAB — MAGNESIUM: Magnesium: 2.3 mg/dL (ref 1.7–2.4)

## 2021-06-06 LAB — PHOSPHORUS: Phosphorus: 4.1 mg/dL (ref 2.5–4.6)

## 2021-06-06 NOTE — Progress Notes (Signed)
[place]  RWE:[id number] DOB: [birth year] DOA: 06/04/2021 PCP: Robyne Peers, MD    Brief Narrative:  This 80 years old male with PMH significant for recently diagnosed acute pulmonary embolism, right pleural effusion and right middle lobe lung mass ,  type 2 diabetes presented in the ED with acute on chronic hypoxic respiratory failure. Patient was recently hospitalized at [facility] for acute hypoxic respiratory failure from 8/23-8/25.  Work-up during that hospitalization.  CTA chest showed evidence of acute PE, right pleural effusion right middle lobe lung mass.  Pulmonology consulted patient underwent thoracocentesis with removal of 1.3 L of pleural fluid blood-tinged in nature, patient was subsequently discharged on 2 L of supplemental oxygen and Eliquis.  Patient had outpatient PET scan . Patient reports worsening shortness of breath after he is discharged home. Repeat CT scan patient has recurrence of pleural effusion. IR consulted,  Patient underwent thoracocentesis, Labs pending. Pulmonology and oncology consulted.  Assessment & Plan:   Principal Problem:   Acute on chronic respiratory failure with hypoxia (HCC) Active Problems:   Pulmonary embolism (HCC)   Pleural effusion   SOB (shortness of breath)   Diabetes mellitus without complication (HCC)   Essential hypertension   Hyperkalemia   GERD (gastroesophageal reflux disease)  Acute on chronic hypoxic respiratory failure: Patient was recently discharged home on supplemental oxygen 2 L/m after being diagnosed with lung mass.  Presented with progressive shortness of breath for 3 to 4 days.  Now requiring 4 to 5 L of supplemental oxygen to maintain saturation above 90%.  This could be due to reaccumulation of effusion following thoracocentesis. This could be postobstructive pneumonia in the setting of right middle lobe lung mass. Continue supplemental oxygen to keep saturation above 90%.  He  successfully weaned down to 3 L/min, sats 92% Patient underwent successful thoracocentesis by IR.   20 cc of amber-colored fluid drained and sent for labs. Continue Zithromax, albuterol inhaler, prednisone. Continue to monitor clinical status.  Pulmonology signed off.  Acute pulmonary embolism: Continue Eliquis  Hyperkalemia: > Improved. EKG without changes, Lokelma given. Hold losartan.  Continue to monitor.  Acute kidney injury: Baseline serum creatinine 1.1, presented with 1.55 Avoid nephrotoxic medications.  Recheck a.m. labs.  Diabetes: Hold metformin, regular insulin sliding scale  Essential hypertension: Hold losartan, monitor blood pressure. Hydralazine prn as needed.  GERD: Continue PPI    DVT prophylaxis:  Code Status: Full code Family Communication: Wife at bedside Disposition Plan:   Status is: Inpatient  Remains inpatient appropriate because:Inpatient level of care appropriate due to severity of illness  Dispo: The patient is from: Home              Anticipated d/c is to: Home              Patient currently is not medically stable to d/c.   Difficult to place patient No  Consultants:  Pulmonology and oncology.  Procedures: Thoracocentesis by IR Antimicrobials:  Anti-infectives (From admission, onward)    Start     Dose/Rate Route Frequency Ordered Stop   06/05/21 0300  azithromycin (ZITHROMAX) 500 mg in sodium chloride 0.9 % 250 mL IVPB        500 mg 250 mL/hr over 60 Minutes Intravenous Every 24 hours 06/05/21 0231          Subjective: Patient was seen and examined at bedside.  No overnight events. Patient is sitting comfortably on the chair, denies any chest pain.  SPO2 94% on 3 L at baseline.  Objective: Vitals:   06/06/21 0415 06/06/21 0430 06/06/21 0445 06/06/21 0800  BP:      Pulse: 70 66 76   Resp: (!) 23 (!) 22 (!) 24   Temp:    97.9 F (36.6 C)  TempSrc:    Oral  SpO2: 94% 93% 93%   Weight:      Height:         Intake/Output Summary (Last 24 hours) at 06/06/2021 0955 Last data filed at 06/06/2021 0400 Gross per 24 hour  Intake 720 ml  Output 900 ml  Net -180 ml   Filed Weights   06/05/21 0900  Weight: 107 kg    Examination:  General exam: Appears comfortable, deconditioned, not in any distress Respiratory system: Clear to auscultation bilaterally, respiratory effort normal. Cardiovascular system: S1, S2 heard, regular rate and rhythm.  No murmur Gastrointestinal system: Abdomen is soft, nontender, nondistended, BS+ Central nervous system: Alert and oriented x 3. No focal neurological deficits. Extremities: No edema, no cyanosis, no clubbing. Skin: No rashes, lesions or ulcers Psychiatry: Judgement and insight appear normal. Mood & affect appropriate.     Data Reviewed: I have personally reviewed following labs and imaging studies  CBC: Recent Labs  Lab 06/04/21 1635 06/05/21 0430 06/06/21 0258  WBC 14.6* 12.1* 17.6*  NEUTROABS 10.9* 10.8*  --   HGB 10.2* 9.5* 9.5*  HCT 32.2* 29.6* 28.9*  MCV 89.0 87.6 85.3  PLT 609* 602* 627*   Basic Metabolic Panel: Recent Labs  Lab 06/04/21 1635 06/04/21 1841 06/04/21 2146 06/05/21 0430 06/05/21 1624 06/06/21 0258  NA 135  --  136 137  --  134*  K 5.9*  --  5.3* 5.6* 4.8 5.0  CL 105  --  107 109  --  104  CO2 20*  --  20* 20*  --  20*  GLUCOSE 112*  --  99 149*  --  167*  BUN 63*  --  61* 61*  --  71*  CREATININE 1.55*  --  1.49* 1.47*  --  1.50*  CALCIUM 9.1  --  8.7* 8.8*  --  8.7*  MG  --  1.9  --  2.2  --  2.3  PHOS  --   --   --  5.0*  --  4.1   GFR: Estimated Creatinine Clearance: 47.4 mL/min (A) (by C-G formula based on SCr of 1.5 mg/dL (H)). Liver Function Tests: Recent Labs  Lab 06/04/21 2146 06/05/21 0430  AST 164* 219*  ALT 91* 100*  ALKPHOS 145* 150*  BILITOT 2.5* 1.9*  PROT 7.3 7.5  ALBUMIN 2.2* 2.1*   No results for input(s): LIPASE, AMYLASE in the last 168 hours. No results for input(s): AMMONIA  in the last 168 hours. Coagulation Profile: No results for input(s): INR, PROTIME in the last 168 hours. Cardiac Enzymes: No results for input(s): CKTOTAL, CKMB, CKMBINDEX, TROPONINI in the last 168 hours. BNP (last 3 results) No results for input(s): PROBNP in the last 8760 hours. HbA1C: No results for input(s): HGBA1C in the last 72 hours. CBG: Recent Labs  Lab 06/05/21 0901 06/05/21 1102 06/05/21 1605 06/05/21 2105 06/06/21 0738  GLUCAP 158* 177* 202* 194* 141*   Lipid Profile: No results for input(s): CHOL, HDL, LDLCALC, TRIG, CHOLHDL, LDLDIRECT in the last 72 hours. Thyroid Function Tests: No results for input(s): TSH, T4TOTAL, FREET4, T3FREE, THYROIDAB in the last 72 hours. Anemia Panel: No results for input(s): VITAMINB12, FOLATE, FERRITIN,  TIBC, IRON, RETICCTPCT in the last 72 hours. Sepsis Labs: Recent Labs  Lab 06/05/21 0430  PROCALCITON 0.64    Recent Results (from the past 240 hour(s))  SARS CORONAVIRUS 2 (TAT 6-24 HRS) Nasopharyngeal Nasopharyngeal Swab     Status: None   Collection Time: 06/04/21  3:32 PM   Specimen: Nasopharyngeal Swab  Result Value Ref Range Status   SARS Coronavirus 2 NEGATIVE NEGATIVE Final    Comment: (NOTE) SARS-CoV-2 target nucleic acids are NOT DETECTED.  The SARS-CoV-2 RNA is generally detectable in upper and lower respiratory specimens during the acute phase of infection. Negative results do not preclude SARS-CoV-2 infection, do not rule out co-infections with other pathogens, and should not be used as the sole basis for treatment or other patient management decisions. Negative results must be combined with clinical observations, patient history, and epidemiological information. The expected result is Negative.  Fact Sheet for Patients: SugarRoll.be  Fact Sheet for Healthcare Providers: https://www.woods-mathews.com/  This test is not yet approved or cleared by the [place]  FDA and  has been authorized for detection and/or diagnosis of SARS-CoV-2 by FDA under an Emergency Use Authorization (EUA). This EUA will remain  in effect (meaning this test can be used) for the duration of the COVID-19 declaration under Se ction 564(b)(1) of the Act, 21 U.S.C. section 360bbb-3(b)(1), unless the authorization is terminated or revoked sooner.  Performed at [facility], [place] [address] [place], [place] [address]   MRSA Next Gen by PCR, Nasal     Status: None   Collection Time: 06/05/21  8:50 AM   Specimen: Nasal Mucosa; Nasal Swab  Result Value Ref Range Status   MRSA by PCR Next Gen NOT DETECTED NOT DETECTED Final    Comment: (NOTE) The GeneXpert MRSA Assay (FDA approved for NASAL specimens only), is one component of a comprehensive MRSA colonization surveillance program. It is not intended to diagnose MRSA infection nor to guide or monitor treatment for MRSA infections. Test performance is not FDA approved in patients less than 44 years old. Performed at [facility], [place] [address] [place], [place] [address]   Body fluid culture w Gram Stain     Status: None (Preliminary result)   Collection Time: 06/05/21 12:27 PM   Specimen: PATH Cytology Pleural fluid  Result Value Ref Range Status   Specimen Description   Final    PLEURAL RIGHT Performed at [place] [address], [place], [place] [address]    Special Requests   Final    NONE Performed at [facility], [place] [address] [place], [place] [address]    Gram Stain   Final    RARE WBC PRESENT,BOTH PMN AND MONONUCLEAR NO ORGANISMS SEEN Performed at [facility], [place] [address], [place], [place] [address]    Culture PENDING  Incomplete   Report Status PENDING  Incomplete    Radiology Studies: DG Chest 1 View  Result Date: 06/05/2021 CLINICAL DATA:  Status post thoracentesis. EXAM: CHEST  1 VIEW COMPARISON:  Chest radiograph dated  06/04/2021. FINDINGS: The heart is obscured. A moderate right pleural effusion with associated atelectasis/consolidation appears unchanged. There is no right pneumothorax. The left lung is clear without pleural effusion or pneumothorax. IMPRESSION: No significant change in a moderate right pleural effusion with associated atelectasis/consolidation. No pneumothorax. Electronically Signed   By: Zerita Boers M.D.   On: 06/05/2021 12:52   DG Chest 2 View  Result Date: 06/04/2021  CLINICAL DATA:  Shortness of breath.  History of lung cancer. EXAM: CHEST - 2 VIEW COMPARISON:  Radiograph 05/27/2021.  PET CT yesterday. FINDINGS: Again seen elevation of right hemidiaphragm. Right pleural effusion and right pulmonary mass seen on PET CT yesterday. Stable heart size and mediastinal contours. Peribronchial thickening. No pneumothorax. No significant left pleural effusion. No acute osseous abnormalities are seen. Known lytic lesions on prior PET are not seen by radiograph. IMPRESSION: 1. Unchanged elevation of right hemidiaphragm. Patient with known right pulmonary mass and pleural effusion, evaluated on PET yesterday. 2. Bronchial thickening. Electronically Signed   By: Keith Rake M.D.   On: 06/04/2021 16:31   US THORACENTESIS ASP PLEURAL SPACE W/IMG GUIDE  Result Date: 06/05/2021 INDICATION: Patient with history of lung cancer, pulmonary embolus, malignant right pleural effusion. Request received for diagnostic and therapeutic right thoracentesis. EXAM: ULTRASOUND GUIDED DIAGNOSTIC AND THERAPEUTIC RIGHT THORACENTESIS MEDICATIONS: 1% lidocaine to skin and subcutaneous tissue COMPLICATIONS: None immediate. PROCEDURE: An ultrasound guided thoracentesis was thoroughly discussed with the patient and questions answered. The benefits, risks, alternatives and complications were also discussed. The patient understands and wishes to proceed with the procedure. Written consent was obtained. Ultrasound was performed to  localize and mark an adequate pocket of fluid in the right chest. The area was then prepped and draped in the normal sterile fashion. 1% Lidocaine was used for local anesthesia. Under ultrasound guidance a 6 Fr Safe-T-Centesis catheter was introduced. Thoracentesis was performed. The catheter was removed and a dressing applied. FINDINGS: A total of approximately 20 cc of amber fluid was removed. Samples were sent to the laboratory as requested by the clinical team. Minimal free fluid was noted on today's ultrasound of right pleural space. Despite catheter manipulation only the above amount of fluid could be removed today. IMPRESSION: Successful ultrasound guided diagnostic and therapeutic right thoracentesis yielding 20 cc of pleural fluid. Read by: Rowe Robert, PA-C Electronically Signed   By: Aletta Edouard M.D.   On: 06/05/2021 12:39    Scheduled Meds:  apixaban  5 mg Oral BID   Chlorhexidine Gluconate Cloth  6 each Topical Daily   feeding supplement  237 mL Oral BID BM   insulin aspart  0-9 Units Subcutaneous TID WC   mouth rinse  15 mL Mouth Rinse BID   mometasone-formoterol  2 puff Inhalation BID   multivitamin with minerals  1 tablet Oral Daily   pantoprazole  40 mg Oral Daily   predniSONE  40 mg Oral Q breakfast   simvastatin  10 mg Oral Daily   umeclidinium bromide  1 puff Inhalation Daily   Continuous Infusions:  azithromycin Stopped (06/06/21 0630)     LOS: 2 days    Time spent: 25 mins    Emaan Gary, MD [facility]   If 7PM-7AM, please contact night-coverage

## 2021-06-07 ENCOUNTER — Inpatient Hospital Stay (HOSPITAL_COMMUNITY): Payer: PPO

## 2021-06-07 DIAGNOSIS — J9621 Acute and chronic respiratory failure with hypoxia: Secondary | ICD-10-CM | POA: Diagnosis not present

## 2021-06-07 LAB — GLUCOSE, CAPILLARY
Glucose-Capillary: 103 mg/dL — ABNORMAL HIGH (ref 70–99)
Glucose-Capillary: 194 mg/dL — ABNORMAL HIGH (ref 70–99)
Glucose-Capillary: 179 mg/dL — ABNORMAL HIGH (ref 70–99)
Glucose-Capillary: 169 mg/dL — ABNORMAL HIGH (ref 70–99)

## 2021-06-07 LAB — CBC
HCT: 30.3 % — ABNORMAL LOW (ref 39.0–52.0)
Hemoglobin: 9.8 g/dL — ABNORMAL LOW (ref 13.0–17.0)
MCH: 27.9 pg (ref 26.0–34.0)
MCHC: 32.3 g/dL (ref 30.0–36.0)
MCV: 86.3 fL (ref 80.0–100.0)
Platelets: 634 10*3/uL — ABNORMAL HIGH (ref 150–400)
RBC: 3.51 MIL/uL — ABNORMAL LOW (ref 4.22–5.81)
RDW: 16.1 % — ABNORMAL HIGH (ref 11.5–15.5)
WBC: 15.4 10*3/uL — ABNORMAL HIGH (ref 4.0–10.5)
nRBC: 0 % (ref 0.0–0.2)

## 2021-06-07 LAB — ACID FAST SMEAR (AFB, MYCOBACTERIA): Acid Fast Smear: NEGATIVE

## 2021-06-07 LAB — BASIC METABOLIC PANEL
Anion gap: 10 (ref 5–15)
BUN: 55 mg/dL — ABNORMAL HIGH (ref 8–23)
CO2: 21 mmol/L — ABNORMAL LOW (ref 22–32)
Calcium: 9.3 mg/dL (ref 8.9–10.3)
Chloride: 113 mmol/L — ABNORMAL HIGH (ref 98–111)
Creatinine, Ser: 1.24 mg/dL (ref 0.61–1.24)
GFR, Estimated: 60 mL/min — ABNORMAL LOW (ref >60)
Glucose, Bld: 102 mg/dL — ABNORMAL HIGH (ref 70–99)
Potassium: 4.2 mmol/L (ref 3.5–5.1)
Sodium: 144 mmol/L (ref 135–145)

## 2021-06-07 MED ORDER — AMLODIPINE BESYLATE 5 MG PO TABS
5.0000 mg | ORAL_TABLET | Freq: Every day | ORAL | Status: DC
  Administered 2021-06-07 – 2021-06-08 (×2): 5 mg via ORAL
  Filled 2021-06-07 (×2): qty 1

## 2021-06-07 MED ORDER — AZITHROMYCIN 250 MG PO TABS
500.0000 mg | ORAL_TABLET | Freq: Every day | ORAL | Status: AC
  Administered 2021-06-08 – 2021-06-09 (×2): 500 mg via ORAL
  Filled 2021-06-07 (×2): qty 2

## 2021-06-07 NOTE — Evaluation (Signed)
Physical Therapy Evaluation Patient Details Name: Kevin [place]bs MRN: [id number] DOB: 09/22/1941 Today's Date: 06/07/2021   History of Present Illness  This 80 years old male with PMH significant for recently diagnosed acute pulmonary embolism, DVT,  pleural effusion and right middle lobe lung mass on ,  type 2 diabetes presented in the ED 06/04/21 with  acute hypoxic respiratory failure. On 2 L home O2.  Clinical Impression  Patient resting in bed , wife at bedside and reports that patient is eager to get out of bed.  Patient required min guard to min assist of 1 for safety to move to sitting . Patient stood at [facility], stepped in place x 10 then 5 steps to recliner.  SPO2 on 1 L 90%, increased to 2 L for mobility, SPO2 94%., HR in 70's.  Patient should progress to return home with wife and HHPT if medical remains stable. Pt admitted with above diagnosis.  Pt currently with functional limitations due to the deficits listed below (see PT Problem List). Pt will benefit from skilled PT to increase their independence and safety with mobility to allow discharge to the venue listed below.       Follow Up Recommendations Home health PT    Equipment Recommendations  None recommended by PT    Recommendations for Other Services       Precautions / Restrictions Precautions Precautions: Fall Precaution Comments: monitor sats      Mobility  Bed Mobility Overal bed mobility: Needs Assistance Bed Mobility: Supine to Sit     Supine to sit: HOB elevated;Min guard     General bed mobility comments: multimodal cues on turning on bed edge, right leg was hanging over.    Transfers Overall transfer level: Needs assistance Equipment used: Rolling walker (2 wheeled) Transfers: Sit to/from Stand Sit to Stand: Min assist         General transfer comment: steady assist to rise.  Ambulation/Gait Ambulation/Gait assistance: Min assist;Min guard Gait Distance (Feet): 5 Feet Assistive device:  Rolling walker (2 wheeled) Gait Pattern/deviations: Step-to pattern     General Gait Details: stepped in place x 10, took 5 steps to recliner.  Stairs            Wheelchair Mobility    Modified Rankin (Stroke Patients Only)       Balance Overall balance assessment: Needs assistance Sitting-balance support: No upper extremity supported;Feet supported Sitting balance-Leahy Scale: Good     Standing balance support: During functional activity;Bilateral upper extremity supported Standing balance-Leahy Scale: Fair Standing balance comment: support of Rw                             Pertinent Vitals/Pain Pain Assessment: No/denies pain    Home Living Family/patient expects to be discharged to:: Private residence Living Arrangements: Spouse/significant other Available Help at Discharge: Family Type of Home: House Home Access: Stairs to enter   [employer] of Steps: 1 Home Layout: Two level;Able to live on main level with bedroom/bathroom Home Equipment: Gilford Rile - 2 wheels      Prior Function Level of Independence: Needs assistance   Gait / Transfers Assistance Needed: uses RW in home, wife with pt. 24/7  ADL's / Homemaking Assistance Needed: wife assists with bath abd dressing recently, prior to 8/23, pt was independnent        Hand Dominance        Extremity/Trunk Assessment   Upper Extremity Assessment Upper Extremity  Assessment: Overall WFL for tasks assessed    Lower Extremity Assessment Lower Extremity Assessment: Generalized weakness    Cervical / Trunk Assessment Cervical / Trunk Assessment: Normal  Communication      Cognition Arousal/Alertness: Awake/alert Behavior During Therapy: WFL for tasks assessed/performed Overall Cognitive Status: Within Functional Limits for tasks assessed                                 General Comments: some delay, looks at wife as if needing her to answer which she does  before patient has a chance to answer.      General Comments      Exercises     Assessment/Plan    PT Assessment Patient needs continued PT services  PT Problem List Decreased strength;Decreased mobility;Decreased activity tolerance;Decreased knowledge of precautions;Decreased balance;Decreased knowledge of use of DME       PT Treatment Interventions DME instruction;Therapeutic activities;Gait training;Therapeutic exercise;Patient/family education;Functional mobility training    PT Goals (Current goals can be found in the Care Plan section)  Acute Rehab PT Goals Patient Stated Goal: go home with some extra help PT Goal Formulation: With patient/family Time For Goal Achievement: 06/04/2021 Potential to Achieve Goals: Good    Frequency Min 3X/week   Barriers to discharge        Co-evaluation PT/OT/SLP Co-Evaluation/Treatment: Yes Reason for Co-Treatment: For patient/therapist safety;To address functional/ADL transfers PT goals addressed during session: Mobility/safety with mobility OT goals addressed during session: ADL's and self-care       AM-PAC PT "6 Clicks" Mobility  Outcome Measure Help needed turning from your back to your side while in a flat bed without using bedrails?: A Little Help needed moving from lying on your back to sitting on the side of a flat bed without using bedrails?: A Little Help needed moving to and from a bed to a chair (including a wheelchair)?: A Little Help needed standing up from a chair using your arms (e.g., wheelchair or bedside chair)?: A Little Help needed to walk in hospital room?: A Little Help needed climbing 3-5 steps with a railing? : A Lot 6 Click Score: 17    End of Session Equipment Utilized During Treatment: Gait belt;Oxygen Activity Tolerance: Patient tolerated treatment well Patient left: in chair;with call bell/phone within reach;with chair alarm set;with family/visitor present Nurse Communication: Mobility status PT  Visit Diagnosis: Difficulty in walking, not elsewhere classified (R26.2)    Time: 9924-2683 PT Time Calculation (min) (ACUTE ONLY): 30 min   Charges:   PT Evaluation $PT Eval Low Complexity: [place] Pager [phone] Office [phone]   Claretha Cooper 06/07/2021, 10:23 AM

## 2021-06-07 NOTE — Plan of Care (Signed)
80/M admitted 06/04/21 with a diagnosis of "acute on chronic respiratory failure". Patient has a new diagnosis of cancer. Plan is for MR Brain in the imminent future. Emotional support provided by this RN to the patient and family.   Problem: Education: Goal: Knowledge of General Education information will improve Description: Including pain rating scale, medication(s)/side effects and non-pharmacologic comfort measures Outcome: Progressing   Problem: Health Behavior/Discharge Planning: Goal: Ability to manage health-related needs will improve Outcome: Progressing   Problem: Clinical Measurements: Goal: Ability to maintain clinical measurements within normal limits will improve Outcome: Progressing Goal: Will remain free from infection Outcome: Progressing Goal: Diagnostic test results will improve Outcome: Progressing Goal: Respiratory complications will improve Outcome: Progressing Goal: Cardiovascular complication will be avoided Outcome: Progressing   Problem: Activity: Goal: Risk for activity intolerance will decrease Outcome: Progressing   Problem: Nutrition: Goal: Adequate nutrition will be maintained Outcome: Progressing   Problem: Coping: Goal: Level of anxiety will decrease Outcome: Progressing   Problem: Elimination: Goal: Will not experience complications related to bowel motility Outcome: Progressing Goal: Will not experience complications related to urinary retention Outcome: Progressing   Problem: Pain Managment: Goal: General experience of comfort will improve Outcome: Progressing   Problem: Safety: Goal: Ability to remain free from injury will improve Outcome: Progressing   Problem: Skin Integrity: Goal: Risk for impaired skin integrity will decrease Outcome: Progressing

## 2021-06-07 NOTE — Plan of Care (Signed)
  Problem: Activity: Goal: Risk for activity intolerance will decrease Outcome: Progressing   Problem: Nutrition: Goal: Adequate nutrition will be maintained Outcome: Progressing   Problem: Coping: Goal: Level of anxiety will decrease Outcome: Progressing   Problem: Pain Managment: Goal: General experience of comfort will improve Outcome: Progressing

## 2021-06-07 NOTE — Consult Note (Signed)
Kevin Hobbs  [phone] Fax:([phone] [place]    Referral MD  Reason for Referral: Lung mass  Chief Complaint  Patient presents with   Shortness of Breath   HPI:  This is a pleasant 80 year old male patient with past medical history significant for type 2 diabetes mellitus, obesity, recently diagnosed with acute left lower extremity DVT, pulmonary embolism and right pleural effusion admitted to [facility] long hospital on September 2 with chief complaint of shortness of breath.  He was recently hospitalized at the end of August with similar complaints and was found to have shortness of breath and dry cough prior to admission.  He had a CT angiogram which showed acute PE as well as right pleural effusion and right middle lobe lung mass at that time.  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 He subsequently had PET/CT which showed large hypermetabolic mass centered in the right middle lobe occluding the right middle lobe bronchi, small loculated pleural effusion with pleural FDG uptake compatible with malignant effusion.  Hypermetabolic right supraclavicular, right paratracheal, subcarinal and bilateral hilar lymph nodes.  Scattered foci of osseous hypermetabolic activity with satellite lesions compatible with osseous metastatic disease. He had another thoracentesis performed which showed minimal fluid.  This was again sent for cytology evaluation.  Medical oncology was consulted for additional recommendations.  He is scheduled to see Dr. Earlie Server outpatient. Today he and his wife are both present at the time of my visit.  He is an ex-smoker, quit smoking over 20 years ago, prior to that might have smoked pot 35 pack years. He worked as a Programmer, applications.  He currently only complains of weight loss, quite dramatic about 45 pounds in the past 1 year and  ongoing shortness of breath.  He denies any chest pain, hemoptysis, change in bowel habits or urinary habits.  No new neurological complaints.  Rest of the pertinent 10 point ROS reviewed and negative.  Past Medical History:  Diagnosis Date   Acute DVT (deep venous thrombosis) (HCC)    LLE; Aug '22   Acute pulmonary embolism ([place])    Aug '22   Diabetes mellitus without complication ([place])    Hypertension    Pleural effusion    right-sided  :   Past Surgical History:  Procedure Laterality Date   HERNIA REPAIR    :   Current Facility-Administered Medications  Medication Dose Route Frequency Provider Last Rate Last Admin   acetaminophen (TYLENOL) tablet 650 mg  650 mg Oral Q6H PRN Howerter, Justin B, DO       Or   acetaminophen (TYLENOL) suppository 650 mg  650 mg Rectal Q6H PRN Howerter, Justin B, DO       albuterol (PROVENTIL) (2.5 MG/3ML) 0.083% nebulizer solution 2.5 mg  2.5 mg Inhalation Q4H PRN Howerter, Justin B, DO       amLODipine (NORVASC) tablet 5 mg  5 mg Oral Daily Shawna Clamp, MD   5 mg at 06/07/21 1146   apixaban (ELIQUIS) tablet 5 mg  5 mg Oral BID Howerter, Justin B, DO   5 mg at 06/07/21 0832   [START ON 06/08/2021] azithromycin (ZITHROMAX) tablet 500 mg  500 mg Oral Daily Leodis Sias T, RPH       Chlorhexidine Gluconate Cloth 2 % PADS 6 each  6 each Topical Daily Howerter, Justin B, DO   6 each at 06/06/21 [phone]  feeding supplement (ENSURE ENLIVE / ENSURE PLUS) liquid 237 mL  237 mL Oral BID BM Howerter, Justin B, DO   237 mL at 06/07/21 1401   insulin aspart (novoLOG) injection 0-9 Units  0-9 Units Subcutaneous TID WC Howerter, Justin B, DO   2 Units at 06/07/21 1146   MEDLINE mouth rinse  15 mL Mouth Rinse BID Howerter, Justin B, DO   15 mL at 06/07/21 0832   mometasone-formoterol (DULERA) 100-5 MCG/ACT inhaler 2 puff  2 puff Inhalation BID Howerter, Justin B, DO   2 puff at 06/07/21 0825   multivitamin with minerals tablet 1 tablet  1 tablet Oral Daily  Howerter, Justin B, DO   1 tablet at 06/07/21 0832   pantoprazole (PROTONIX) EC tablet 40 mg  40 mg Oral Daily Howerter, Justin B, DO   40 mg at 06/07/21 0076   predniSONE (DELTASONE) tablet 40 mg  40 mg Oral Q breakfast Hunsucker, Bonna Gains, MD   40 mg at 06/07/21 2263   simvastatin (ZOCOR) tablet 10 mg  10 mg Oral Daily Howerter, Justin B, DO   10 mg at 06/07/21 3354   umeclidinium bromide (INCRUSE ELLIPTA) 62.5 MCG/INH 1 puff  1 puff Inhalation Daily Howerter, Justin B, DO   1 puff at 06/07/21 0826     No Known Allergies:  History reviewed. No pertinent family history.:   Social History   Socioeconomic History   Marital status: Married    Spouse name: Not on file   Number of children: Not on file   Years of education: Not on file   Highest education level: Not on file  Occupational History   Not on file  Tobacco Use   Smoking status: Former    Types: Cigarettes   Smokeless tobacco: Never  Substance and Sexual Activity   Alcohol use: Not Currently   Drug use: Never   Sexual activity: Not on file  Other Topics Concern   Not on file  Social History Narrative   Not on file   Social Determinants of Health   Financial Resource Strain: Not on file  Food Insecurity: Not on file  Transportation Needs: Not on file  Physical Activity: Not on file  Stress: Not on file  Social Connections: Not on file  Intimate Partner Violence: Not on file  :  Pertinent items are noted in HPI.  Exam: Patient Vitals for the past 24 hrs:  BP Temp Temp src Pulse Resp SpO2  06/07/21 1213 138/68 98 F (36.7 C) -- 90 (!) 24 99 %  06/07/21 0900 (!) 161/72 -- -- -- (!) 32 --  06/07/21 0836 -- -- -- 95 (!) 26 94 %  06/07/21 0826 -- -- -- -- -- 95 %  06/07/21 0800 -- (!) 97.4 F (36.3 C) Oral -- -- --  06/07/21 0600 (!) 145/55 -- -- 89 (!) 29 98 %  06/07/21 0500 (!) 141/52 -- -- 86 (!) 28 92 %  06/07/21 0400 (!) 146/61 -- -- 75 (!) 23 93 %  06/07/21 0351 -- 97.9 F (36.6 C) Oral -- -- --   06/07/21 0300 (!) 132/45 -- -- -- (!) 24 --  06/07/21 0200 134/62 -- -- 73 (!) 31 93 %  06/07/21 0000 (!) 154/70 -- -- 71 (!) 29 94 %  06/06/21 2320 -- 98.4 F (36.9 C) Oral -- -- --  06/06/21 2200 (!) 161/55 -- -- 77 (!) 26 94 %  06/06/21 2042 -- -- -- -- -- 93 %  06/06/21 2000 (!) 217/73 -- -- 71 (!) 24 93 %  06/06/21 1907 -- 97.7 F (36.5 C) Oral -- -- --  06/06/21 1822 -- -- -- 70 (!) 31 94 %  06/06/21 1821 -- -- -- 67 (!) 27 95 %  06/06/21 1820 (!) 147/91 -- -- 65 (!) 28 93 %  06/06/21 1819 -- -- -- 67 (!) 28 94 %  06/06/21 1818 -- -- -- 67 (!) 31 95 %  06/06/21 1817 -- -- -- 71 (!) 28 94 %  06/06/21 1816 -- -- -- 75 (!) 28 94 %  06/06/21 1815 -- -- -- 70 (!) 31 94 %  06/06/21 1814 -- -- -- 70 (!) 31 94 %  06/06/21 1813 -- -- -- 70 (!) 27 93 %  06/06/21 1812 -- -- -- 79 (!) 27 94 %  06/06/21 1811 -- -- -- 83 20 94 %  06/06/21 1810 -- -- -- 79 (!) 28 95 %  06/06/21 1809 -- -- -- 70 (!) 24 97 %  06/06/21 1808 -- -- -- 75 (!) 29 93 %  06/06/21 1807 -- -- -- 86 16 95 %  06/06/21 1806 -- -- -- 77 (!) 26 94 %  06/06/21 1805 -- -- -- 67 (!) 30 95 %  06/06/21 1804 -- -- -- 78 (!) 24 95 %  06/06/21 1803 -- -- -- 84 (!) 23 93 %  06/06/21 1802 -- -- -- 81 19 94 %  06/06/21 1801 -- -- -- 77 (!) 25 93 %  06/06/21 1800 -- -- -- 89 (!) 23 94 %  06/06/21 1700 -- -- -- 71 (!) 25 95 %  06/06/21 1600 -- 98.2 F (36.8 C) Oral 88 (!) 24 94 %  06/06/21 1500 -- -- -- 67 (!) 28 100 %   Physical Exam Constitutional:      Appearance: He is well-developed.  HENT:     Head: Normocephalic and atraumatic.  Cardiovascular:     Rate and Rhythm: Normal rate and regular rhythm.  Pulmonary:     Effort: Pulmonary effort is normal.     Breath sounds: Examination of the right-lower field reveals decreased breath sounds. Decreased breath sounds present.  Abdominal:     General: Bowel sounds are normal.     Palpations: Abdomen is soft.  Musculoskeletal:        General: Normal range of motion.      Cervical back: Normal range of motion and neck supple.  Skin:    General: Skin is warm and dry.  Neurological:     General: No focal deficit present.     Mental Status: He is alert.  Psychiatric:        Mood and Affect: Mood normal.      Lab Results  Component Value Date   WBC 15.4 (H) 06/07/2021   HGB 9.8 (L) 06/07/2021   HCT 30.3 (L) 06/07/2021   PLT 634 (H) 06/07/2021   GLUCOSE 102 (H) 06/07/2021   ALT 100 (H) 06/05/2021   AST 219 (H) 06/05/2021   NA 144 06/07/2021   K 4.2 06/07/2021   CL 113 (H) 06/07/2021   CREATININE 1.24 06/07/2021   BUN 55 (H) 06/07/2021   CO2 21 (L) 06/07/2021    DG Chest 1 View  Result Date: 06/05/2021 CLINICAL DATA:  Status post thoracentesis. EXAM: CHEST  1 VIEW COMPARISON:  Chest radiograph dated 06/04/2021. FINDINGS: The heart is obscured. A moderate right pleural effusion with associated atelectasis/consolidation appears unchanged. There  is no right pneumothorax. The left lung is clear without pleural effusion or pneumothorax. IMPRESSION: No significant change in a moderate right pleural effusion with associated atelectasis/consolidation. No pneumothorax. Electronically Signed   By: Zerita Boers M.D.   On: 06/05/2021 12:52   DG Chest 1 View  Result Date: 05/26/2021 CLINICAL DATA:  Status post right thoracentesis. EXAM: CHEST  1 VIEW COMPARISON:  One-view chest x-ray 05/25/2021 FINDINGS: Heart is enlarged. Right pleural effusion is significantly decreased. No pneumothorax is present. Left lung is clear. Pulmonary vascular congestion noted. IMPRESSION: 1. Right pleural effusion without evidence for complication. 2. Pulmonary vascular congestion. Electronically Signed   By: San Morelle M.D.   On: 05/26/2021 15:36   DG Chest 2 View  Result Date: 06/04/2021 CLINICAL DATA:  Shortness of breath.  History of lung cancer. EXAM: CHEST - 2 VIEW COMPARISON:  Radiograph 05/27/2021.  PET CT yesterday. FINDINGS: Again seen elevation of right  hemidiaphragm. Right pleural effusion and right pulmonary mass seen on PET CT yesterday. Stable heart size and mediastinal contours. Peribronchial thickening. No pneumothorax. No significant left pleural effusion. No acute osseous abnormalities are seen. Known lytic lesions on prior PET are not seen by radiograph. IMPRESSION: 1. Unchanged elevation of right hemidiaphragm. Patient with known right pulmonary mass and pleural effusion, evaluated on PET yesterday. 2. Bronchial thickening. Electronically Signed   By: Keith Rake M.D.   On: 06/04/2021 16:31   CT Angio Chest PE W and/or Wo Contrast  Result Date: 05/25/2021 CLINICAL DATA:  Shortness of breath EXAM: CT ANGIOGRAPHY CHEST WITH CONTRAST TECHNIQUE: Multidetector CT imaging of the chest was performed using the standard protocol during bolus administration of intravenous contrast. Multiplanar CT image reconstructions and MIPs were obtained to evaluate the vascular anatomy. CONTRAST:  9m OMNIPAQUE IOHEXOL 350 MG/ML SOLN COMPARISON:  None. FINDINGS: Cardiovascular: Examination for pulmonary embolism is limited by marginal contrast bolus, main pulmonary artery = 141 HU. Within this limitation, positive examination for pulmonary embolism, with segmental to subsegmental embolus present in the right upper lobe (series 4, image 37). Normal heart size. Three-vessel coronary artery calcifications. No pericardial effusion. Aortic atherosclerosis. Enlargement of the main pulmonary artery measuring up to 3.6 cm in caliber. RV LV ratio is preserved, approximately 0.6. Mediastinum/Nodes: Markedly enlarged subcarinal nodes measuring at least 6.1 x 2.6 cm (series 4, image 52) thyroid gland, trachea, and esophagus demonstrate no significant findings. Lungs/Pleura: Moderate right pleural effusion associated atelectasis or consolidation. There is a large mass centered in the right middle lobe, which occludes the middle lobe segmental bronchi, although difficult to  distinguish from airspace consolidation and adjacent atelectasis, this measures approximately 7.9 x 7.7 cm (series 4, image 55). Upper Abdomen: No acute abnormality. Gallstones and or sludge in the dependent gallbladder. Musculoskeletal: No chest wall abnormality. No acute or significant osseous findings. Review of the MIP images confirms the above findings. IMPRESSION: 1. Positive examination for pulmonary embolism, with segmental to subsegmental embolus present in the right upper lobe. 2. Enlargement of the main pulmonary artery, concerning for pulmonary hypertension. No elevation of the RV LV ratio. 3. There is a large mass centered in the right middle lobe, which occludes the middle lobe segmental bronchi, although difficult to distinguish from airspace consolidation and adjacent atelectasis, this measures approximately 7.9 x 7.7 cm. 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 4. Markedly enlarged subcarinal lymph nodes, concerning for nodal metastatic disease. 5. Moderate right pleural effusion and associated atelectasis or consolidation, presumably malignant, however without direct evidence of  pleural metastatic disease. 6. Coronary artery disease. These results were called by telephone at the time of interpretation on 05/25/2021 at 11:29 am to Dr. Lennice Sites , who verbally acknowledged these results. Aortic Atherosclerosis (ICD10-I70.0). Electronically Signed   By: Eddie Candle M.D.   On: 05/25/2021 11:30   NM PET Image Initial (PI) Skull Base To Thigh  Result Date: 06/04/2021 CLINICAL DATA:  Initial treatment strategy for lung nodule. EXAM: NUCLEAR MEDICINE PET SKULL BASE TO THIGH TECHNIQUE: 12.9 mCi F-18 FDG was injected intravenously. Full-ring PET imaging was performed from the skull base to thigh after the radiotracer. CT data was obtained and used for attenuation correction and anatomic localization. Fasting blood glucose: 115 mg/dl COMPARISON:  Chest CTA dated May 25, 2021  FINDINGS: Mediastinal blood pool activity: SUV max 3.1 Liver activity: SUV max 3.8. NECK: Hypermetabolic right supraclavicular lymph node measuring 7 mm in short axis on image number 39 with an SUV max of 6.8. Incidental CT findings: none CHEST: Hypermetabolic mass centered in the right upper lobe, difficult to measure, but approximately 9.4 x 9.4 cm with an SUV max of 23. Hypermetabolic right paratracheal, subcarinal and bilateral hilar lymph nodes. Reference subcarinal lymph node measures approximately 2.0 cm in short axis with an SUV max of 11.2. Reference right hilar lymph node measures 1.5 cm in short axis on image 69 with an SUV max of 15.2. Reference left hilar lymph node measures approximately 1.4 cm in short axis with an SUV max of 10.4. Small loculated right pleural effusion with pleural FDG uptake, SUV max of 6.3. Incidental CT findings: No pericardial effusion. Calcifications of the LAD and circumflex. Atherosclerotic disease of the thoracic aorta. ABDOMEN/PELVIS: No abnormal hypermetabolic activity within the liver, pancreas, adrenal glands, or spleen. No hypermetabolic lymph nodes in the abdomen or pelvis. Incidental CT findings: Cholelithiasis and sigmoid diverticula. SKELETON: Scattered foci of osseous hypermetabolic activity which correlate with subtle lytic lesions in the right iliac bone, T12 vertebral body, lateral sixth rib, and left scapula. Reference right iliac lesion demonstrates an SUV max of 8.2. Incidental CT findings: none IMPRESSION: Large hypermetabolic mass centered in the right middle lobe which occludes the right middle lobe bronchi. Small loculated pleural effusion with pleural FDG uptake, compatible with malignant effusion. Hypermetabolic right supraclavicular, right paratracheal, subcarinal, and bilateral hilar lymph nodes. Scattered foci of osseous hypermetabolic activity which correlate with subtle lytic lesions, compatible with osseous metastatic disease. Aortic  Atherosclerosis (ICD10-I70.0). Electronically Signed   By: Yetta Glassman M.D.   On: 06/04/2021 16:20   DG CHEST PORT 1 VIEW  Result Date: 06/07/2021 CLINICAL DATA:  Right lower lung mass with component of pleural effusion. EXAM: PORTABLE CHEST 1 VIEW COMPARISON:  Chest x-rays on 06/04/2021 and 06/05/2021. PET scan on 06/03/2021. FINDINGS: Stable heart size and aortic tortuosity. Extensive mass-like density and consolidation of the right lower lung noted which has been previously demonstrated to be a large mass at the right lung base. Component of associated right pleural fluid may be present. However, recent ultrasound did not demonstrate a large pleural effusion. No pneumothorax or pulmonary edema. IMPRESSION: Mass-like opacity and consolidation of the right lower lung. Potential associated right pleural fluid. Electronically Signed   By: Aletta Edouard M.D.   On: 06/07/2021 08:43   DG CHEST PORT 1 VIEW  Result Date: 05/27/2021 CLINICAL DATA:  Short of breath.  Right thoracentesis yesterday EXAM: PORTABLE CHEST 1 VIEW COMPARISON:  05/26/2021 FINDINGS: Persistent density in the right lung base may represent neoplasm based  on CT. Pneumonia possible. Small right effusion unchanged. No pneumothorax Left lung remains clear. IMPRESSION: Persistent airspace density in the right lung base may represent tumor or pneumonia. No pneumothorax. Electronically Signed   By: Franchot Gallo M.D.   On: 05/27/2021 08:19   DG Chest Portable 1 View  Result Date: 05/25/2021 CLINICAL DATA:  Breath for 1 week, hypertension, RIGHT pleural effusion by abdominal ultrasound EXAM: PORTABLE CHEST 1 VIEW COMPARISON:  Portable exam 0920 hours without priors for comparison FINDINGS: Enlargement of cardiac silhouette with pulmonary vascular congestion. Atherosclerotic calcification aorta. RIGHT pleural effusion and basilar atelectasis versus consolidation. Minimal atelectasis at LEFT base. Upper lungs clear. No pneumothorax. Bones  demineralized. IMPRESSION: Enlargement of cardiac silhouette with pulmonary vascular congestion. RIGHT pleural effusion with basilar atelectasis and/or consolidation. Minimal LEFT basilar atelectasis. Electronically Signed   By: Lavonia Dana M.D.   On: 05/25/2021 10:03   ECHOCARDIOGRAM COMPLETE  Result Date: 05/26/2021    ECHOCARDIOGRAM REPORT   Patient Name:   Kevin Hobbs Date of Exam: 05/26/2021 Medical Rec #:  [id number]       Height:       67.0 in Accession #:    [id number]      Weight:       262.0 lb Date of Birth:  1941-03-02       BSA:          2.268 m Patient Age:    82 years        BP:           144/80 mmHg Patient Gender: M               HR:           88 bpm. Exam Location:  Inpatient Procedure: 2D Echo, Cardiac Doppler and Color Doppler Indications:    Pulmonary embolus  History:        Patient has no prior history of Echocardiogram examinations.                 Signs/Symptoms:Pulmonary embolus; Risk Factors:Diabetes,                 Hypertension, Former Smoker and Obesity.  Sonographer:    Dustin Flock RDCS Referring Phys: [place]  1. Left ventricular ejection fraction, by estimation, is 60 to 65%. The left ventricle has normal function. Left ventricular endocardial border not optimally defined to evaluate regional wall motion. The left ventricular internal cavity size was mildly to moderately dilated. Left ventricular diastolic parameters are indeterminate.  2. Right ventricular systolic function is normal. The right ventricular size is normal.  3. The mitral valve is normal in structure. No evidence of mitral valve regurgitation.  4. The aortic valve is grossly normal. Aortic valve regurgitation is not visualized. FINDINGS  Left Ventricle: Left ventricular ejection fraction, by estimation, is 60 to 65%. The left ventricle has normal function. Left ventricular endocardial border not optimally defined to evaluate regional wall motion. The left ventricular internal  cavity size was mildly to moderately dilated. There is no left ventricular hypertrophy. Left ventricular diastolic parameters are indeterminate. Right Ventricle: The right ventricular size is normal. Right vetricular wall thickness was not well visualized. Right ventricular systolic function is normal. Left Atrium: Left atrial size was normal in size. Right Atrium: Right atrial size was normal in size. Pericardium: There is no evidence of pericardial effusion. Mitral Valve: The mitral valve is normal in structure. No evidence of mitral valve regurgitation. Tricuspid Valve: The  tricuspid valve is grossly normal. Tricuspid valve regurgitation is mild. Aortic Valve: The aortic valve is grossly normal. Aortic valve regurgitation is not visualized. Pulmonic Valve: The pulmonic valve was grossly normal. Pulmonic valve regurgitation is not visualized. Aorta: The aortic root and ascending aorta are structurally normal, with no evidence of dilitation. IAS/Shunts: The atrial septum is grossly normal.  LEFT VENTRICLE PLAX 2D LVIDd:         5.10 cm      Diastology LVIDs:         3.20 cm      LV e' medial:    6.96 cm/s LV PW:         1.20 cm      LV E/e' medial:  9.4 LV IVS:        1.00 cm      LV e' lateral:   6.09 cm/s LVOT diam:     2.40 cm      LV E/e' lateral: 10.7 LV SV:         79 LV SV Index:   35 LVOT Area:     4.52 cm  LV Volumes (MOD) LV vol d, MOD A4C: 157.0 ml LV vol s, MOD A4C: 66.8 ml LV SV MOD A4C:     157.0 ml RIGHT VENTRICLE RV Basal diam:  2.50 cm RV S prime:     16.80 cm/s TAPSE (M-mode): 2.4 cm LEFT ATRIUM             Index       RIGHT ATRIUM           Index LA diam:        3.10 cm 1.37 cm/m  RA Area:     13.80 cm LA Vol (A2C):   65.9 ml 29.05 ml/m RA Volume:   36.60 ml  16.14 ml/m LA Vol (A4C):   44.3 ml 19.53 ml/m LA Biplane Vol: 54.5 ml 24.03 ml/m  AORTIC VALVE LVOT Vmax:   110.00 cm/s LVOT Vmean:  64.400 cm/s LVOT VTI:    0.174 m  AORTA Ao Root diam: 2.90 cm MITRAL VALVE MV Area (PHT): 3.33 cm     SHUNTS MV Decel Time: 228 msec    Systemic VTI:  0.17 m MV E velocity: 65.10 cm/s  Systemic Diam: 2.40 cm MV A velocity: 78.40 cm/s MV E/A ratio:  0.83 Mertie Moores MD Electronically signed by Mertie Moores MD Signature Date/Time: 05/26/2021/1:17:37 PM    Final    VAS [place] LOWER EXTREMITY VENOUS (DVT)  Result Date: 05/27/2021  Lower Venous DVT Study Patient Name:  Kevin Hobbs  Date of Exam:   05/27/2021 Medical Rec #: [id number]        Accession #:    [id number] Date of Birth: May 21, 1941        Patient Gender: M Patient Age:   54 years Exam Location:  [facility] Procedure:      VAS [place] LOWER EXTREMITY VENOUS (DVT) Referring Phys: Brand Males --------------------------------------------------------------------------------  Indications: Pulmonary embolism. Other Indications: Incrasing shortness of breath x1 week. Comparison Study: No previous exams Performing Technologist: Jody Hill RVT, RDMS  Examination Guidelines: A complete evaluation includes B-mode imaging, spectral Doppler, color Doppler, and power Doppler as needed of all accessible portions of each vessel. Bilateral testing is considered an integral part of a complete examination. Limited examinations for reoccurring indications may be performed as noted. The reflux portion of the exam is performed with the patient in reverse Trendelenburg.  +---------+---------------+---------+-----------+----------+--------------+ RIGHT  CompressibilityPhasicitySpontaneityPropertiesThrombus Aging +---------+---------------+---------+-----------+----------+--------------+ CFV      Full           Yes      Yes                                 +---------+---------------+---------+-----------+----------+--------------+ SFJ      Full                                                        +---------+---------------+---------+-----------+----------+--------------+ FV Prox  Full           Yes      Yes                                  +---------+---------------+---------+-----------+----------+--------------+ FV Mid   Full           Yes      Yes                                 +---------+---------------+---------+-----------+----------+--------------+ FV DistalFull           Yes      Yes                                 +---------+---------------+---------+-----------+----------+--------------+ PFV      Full                                                        +---------+---------------+---------+-----------+----------+--------------+ POP      Full           Yes      Yes                                 +---------+---------------+---------+-----------+----------+--------------+ PTV      Full                                                        +---------+---------------+---------+-----------+----------+--------------+ PERO     Full                                                        +---------+---------------+---------+-----------+----------+--------------+   +---------+---------------+---------+-----------+----------+--------------+ LEFT     CompressibilityPhasicitySpontaneityPropertiesThrombus Aging +---------+---------------+---------+-----------+----------+--------------+ CFV      Full           Yes      Yes                                 +---------+---------------+---------+-----------+----------+--------------+ SFJ  Full                                                        +---------+---------------+---------+-----------+----------+--------------+ FV Prox  Full           Yes      Yes                                 +---------+---------------+---------+-----------+----------+--------------+ FV Mid   Full           Yes      Yes                                 +---------+---------------+---------+-----------+----------+--------------+ FV DistalFull           Yes      Yes                                  +---------+---------------+---------+-----------+----------+--------------+ PFV      Full                                                        +---------+---------------+---------+-----------+----------+--------------+ POP      Full           Yes      Yes                                 +---------+---------------+---------+-----------+----------+--------------+ PTV      Full                                                        +---------+---------------+---------+-----------+----------+--------------+ PERO     None           No       No                   Acute          +---------+---------------+---------+-----------+----------+--------------+     Summary: BILATERAL: - No evidence of superficial venous thrombosis in the lower extremities, bilaterally. -No evidence of popliteal cyst, bilaterally. RIGHT: - There is no evidence of deep vein thrombosis in the lower extremity.  LEFT: - Findings consistent with acute deep vein thrombosis involving the left peroneal veins.  *See table(s) above for measurements and observations. Electronically signed by Jamelle Haring on 05/27/2021 at 5:52:25 PM.    Final    US THORACENTESIS ASP PLEURAL SPACE W/IMG GUIDE  Result Date: 06/05/2021 INDICATION: Patient with history of lung cancer, pulmonary embolus, malignant right pleural effusion. Request received for diagnostic and therapeutic right thoracentesis. EXAM: ULTRASOUND GUIDED DIAGNOSTIC AND THERAPEUTIC RIGHT THORACENTESIS MEDICATIONS: 1% lidocaine to skin and subcutaneous tissue COMPLICATIONS: None immediate. PROCEDURE: An ultrasound guided thoracentesis was thoroughly discussed with the patient and questions answered. The benefits,  risks, alternatives and complications were also discussed. The patient understands and wishes to proceed with the procedure. Written consent was obtained. Ultrasound was performed to localize and mark an adequate pocket of fluid in the right chest. The area was then  prepped and draped in the normal sterile fashion. 1% Lidocaine was used for local anesthesia. Under ultrasound guidance a 6 Fr Safe-T-Centesis catheter was introduced. Thoracentesis was performed. The catheter was removed and a dressing applied. FINDINGS: A total of approximately 20 cc of amber fluid was removed. Samples were sent to the laboratory as requested by the clinical team. Minimal free fluid was noted on today's ultrasound of right pleural space. Despite catheter manipulation only the above amount of fluid could be removed today. IMPRESSION: Successful ultrasound guided diagnostic and therapeutic right thoracentesis yielding 20 cc of pleural fluid. Read by: Rowe Robert, PA-C Electronically Signed   By: Aletta Edouard M.D.   On: 06/05/2021 12:39   US THORACENTESIS ASP PLEURAL SPACE W/IMG GUIDE  Result Date: 05/26/2021 INDICATION: Patient with history of right lung mass, pulmonary embolus, dyspnea, right pleural effusion. Request received for diagnostic and therapeutic right thoracentesis. EXAM: ULTRASOUND GUIDED DIAGNOSTIC AND THERAPEUTIC RIGHT THORACENTESIS MEDICATIONS: 1% lidocaine to skin and subcutaneous tissue COMPLICATIONS: None immediate. PROCEDURE: An ultrasound guided thoracentesis was thoroughly discussed with the patient and questions answered. The benefits, risks, alternatives and complications were also discussed. The patient understands and wishes to proceed with the procedure. Written consent was obtained. Ultrasound was performed to localize and mark an adequate pocket of fluid in the right chest. The area was then prepped and draped in the normal sterile fashion. 1% Lidocaine was used for local anesthesia. Under ultrasound guidance a 6 Fr Safe-T-Centesis catheter was introduced. Thoracentesis was performed. The catheter was removed and a dressing applied. FINDINGS: A total of approximately 1.3 liters of blood-tinged fluid was removed. Samples were sent to the laboratory as requested  by the clinical team. IMPRESSION: Successful ultrasound guided diagnostic and therapeutic right thoracentesis yielding 1.3 liters of pleural fluid. Read by: Rowe Robert, PA-C Electronically Signed   By: Markus Daft M.D.   On: 05/26/2021 15:10    Pathology:Reviewed, NSCLC  DG Chest 1 View  Result Date: 06/05/2021 CLINICAL DATA:  Status post thoracentesis. EXAM: CHEST  1 VIEW COMPARISON:  Chest radiograph dated 06/04/2021. FINDINGS: The heart is obscured. A moderate right pleural effusion with associated atelectasis/consolidation appears unchanged. There is no right pneumothorax. The left lung is clear without pleural effusion or pneumothorax. IMPRESSION: No significant change in a moderate right pleural effusion with associated atelectasis/consolidation. No pneumothorax. Electronically Signed   By: Zerita Boers M.D.   On: 06/05/2021 12:52   DG Chest 1 View  Result Date: 05/26/2021 CLINICAL DATA:  Status post right thoracentesis. EXAM: CHEST  1 VIEW COMPARISON:  One-view chest x-ray 05/25/2021 FINDINGS: Heart is enlarged. Right pleural effusion is significantly decreased. No pneumothorax is present. Left lung is clear. Pulmonary vascular congestion noted. IMPRESSION: 1. Right pleural effusion without evidence for complication. 2. Pulmonary vascular congestion. Electronically Signed   By: San Morelle M.D.   On: 05/26/2021 15:36   DG Chest 2 View  Result Date: 06/04/2021 CLINICAL DATA:  Shortness of breath.  History of lung cancer. EXAM: CHEST - 2 VIEW COMPARISON:  Radiograph 05/27/2021.  PET CT yesterday. FINDINGS: Again seen elevation of right hemidiaphragm. Right pleural effusion and right pulmonary mass seen on PET CT yesterday. Stable heart size and mediastinal contours. Peribronchial thickening. No pneumothorax. No significant left  pleural effusion. No acute osseous abnormalities are seen. Known lytic lesions on prior PET are not seen by radiograph. IMPRESSION: 1. Unchanged elevation of right  hemidiaphragm. Patient with known right pulmonary mass and pleural effusion, evaluated on PET yesterday. 2. Bronchial thickening. Electronically Signed   By: Keith Rake M.D.   On: 06/04/2021 16:31   CT Angio Chest PE W and/or Wo Contrast  Result Date: 05/25/2021 CLINICAL DATA:  Shortness of breath EXAM: CT ANGIOGRAPHY CHEST WITH CONTRAST TECHNIQUE: Multidetector CT imaging of the chest was performed using the standard protocol during bolus administration of intravenous contrast. Multiplanar CT image reconstructions and MIPs were obtained to evaluate the vascular anatomy. CONTRAST:  35m OMNIPAQUE IOHEXOL 350 MG/ML SOLN COMPARISON:  None. FINDINGS: Cardiovascular: Examination for pulmonary embolism is limited by marginal contrast bolus, main pulmonary artery = 141 HU. Within this limitation, positive examination for pulmonary embolism, with segmental to subsegmental embolus present in the right upper lobe (series 4, image 37). Normal heart size. Three-vessel coronary artery calcifications. No pericardial effusion. Aortic atherosclerosis. Enlargement of the main pulmonary artery measuring up to 3.6 cm in caliber. RV LV ratio is preserved, approximately 0.6. Mediastinum/Nodes: Markedly enlarged subcarinal nodes measuring at least 6.1 x 2.6 cm (series 4, image 52) thyroid gland, trachea, and esophagus demonstrate no significant findings. Lungs/Pleura: Moderate right pleural effusion associated atelectasis or consolidation. There is a large mass centered in the right middle lobe, which occludes the middle lobe segmental bronchi, although difficult to distinguish from airspace consolidation and adjacent atelectasis, this measures approximately 7.9 x 7.7 cm (series 4, image 55). Upper Abdomen: No acute abnormality. Gallstones and or sludge in the dependent gallbladder. Musculoskeletal: No chest wall abnormality. No acute or significant osseous findings. Review of the MIP images confirms the above findings.  IMPRESSION: 1. Positive examination for pulmonary embolism, with segmental to subsegmental embolus present in the right upper lobe. 2. Enlargement of the main pulmonary artery, concerning for pulmonary hypertension. No elevation of the RV LV ratio. 3. There is a large mass centered in the right middle lobe, which occludes the middle lobe segmental bronchi, although difficult to distinguish from airspace consolidation and adjacent atelectasis, this measures approximately 7.9 x 7.7 cm. 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 4. Markedly enlarged subcarinal lymph nodes, concerning for nodal metastatic disease. 5. Moderate right pleural effusion and associated atelectasis or consolidation, presumably malignant, however without direct evidence of pleural metastatic disease. 6. Coronary artery disease. These results were called by telephone at the time of interpretation on 05/25/2021 at 11:29 am to Dr. ALennice Sites, who verbally acknowledged these results. Aortic Atherosclerosis (ICD10-I70.0). Electronically Signed   By: AEddie CandleM.D.   On: 05/25/2021 11:30   NM PET Image Initial (PI) Skull Base To Thigh  Result Date: 06/04/2021 CLINICAL DATA:  Initial treatment strategy for lung nodule. EXAM: NUCLEAR MEDICINE PET SKULL BASE TO THIGH TECHNIQUE: 12.9 mCi F-18 FDG was injected intravenously. Full-ring PET imaging was performed from the skull base to thigh after the radiotracer. CT data was obtained and used for attenuation correction and anatomic localization. Fasting blood glucose: 115 mg/dl COMPARISON:  Chest CTA dated May 25, 2021 FINDINGS: Mediastinal blood pool activity: SUV max 3.1 Liver activity: SUV max 3.8. NECK: Hypermetabolic right supraclavicular lymph node measuring 7 mm in short axis on image number 39 with an SUV max of 6.8. Incidental CT findings: none CHEST: Hypermetabolic mass centered in the right upper lobe, difficult to measure, but approximately 9.4 x 9.4 cm  with an SUV  max of 23. Hypermetabolic right paratracheal, subcarinal and bilateral hilar lymph nodes. Reference subcarinal lymph node measures approximately 2.0 cm in short axis with an SUV max of 11.2. Reference right hilar lymph node measures 1.5 cm in short axis on image 69 with an SUV max of 15.2. Reference left hilar lymph node measures approximately 1.4 cm in short axis with an SUV max of 10.4. Small loculated right pleural effusion with pleural FDG uptake, SUV max of 6.3. Incidental CT findings: No pericardial effusion. Calcifications of the LAD and circumflex. Atherosclerotic disease of the thoracic aorta. ABDOMEN/PELVIS: No abnormal hypermetabolic activity within the liver, pancreas, adrenal glands, or spleen. No hypermetabolic lymph nodes in the abdomen or pelvis. Incidental CT findings: Cholelithiasis and sigmoid diverticula. SKELETON: Scattered foci of osseous hypermetabolic activity which correlate with subtle lytic lesions in the right iliac bone, T12 vertebral body, lateral sixth rib, and left scapula. Reference right iliac lesion demonstrates an SUV max of 8.2. Incidental CT findings: none IMPRESSION: Large hypermetabolic mass centered in the right middle lobe which occludes the right middle lobe bronchi. Small loculated pleural effusion with pleural FDG uptake, compatible with malignant effusion. Hypermetabolic right supraclavicular, right paratracheal, subcarinal, and bilateral hilar lymph nodes. Scattered foci of osseous hypermetabolic activity which correlate with subtle lytic lesions, compatible with osseous metastatic disease. Aortic Atherosclerosis (ICD10-I70.0). Electronically Signed   By: Yetta Glassman M.D.   On: 06/04/2021 16:20   DG CHEST PORT 1 VIEW  Result Date: 06/07/2021 CLINICAL DATA:  Right lower lung mass with component of pleural effusion. EXAM: PORTABLE CHEST 1 VIEW COMPARISON:  Chest x-rays on 06/04/2021 and 06/05/2021. PET scan on 06/03/2021. FINDINGS: Stable heart size and aortic  tortuosity. Extensive mass-like density and consolidation of the right lower lung noted which has been previously demonstrated to be a large mass at the right lung base. Component of associated right pleural fluid may be present. However, recent ultrasound did not demonstrate a large pleural effusion. No pneumothorax or pulmonary edema. IMPRESSION: Mass-like opacity and consolidation of the right lower lung. Potential associated right pleural fluid. Electronically Signed   By: Aletta Edouard M.D.   On: 06/07/2021 08:43   DG CHEST PORT 1 VIEW  Result Date: 05/27/2021 CLINICAL DATA:  Short of breath.  Right thoracentesis yesterday EXAM: PORTABLE CHEST 1 VIEW COMPARISON:  05/26/2021 FINDINGS: Persistent density in the right lung base may represent neoplasm based on CT. Pneumonia possible. Small right effusion unchanged. No pneumothorax Left lung remains clear. IMPRESSION: Persistent airspace density in the right lung base may represent tumor or pneumonia. No pneumothorax. Electronically Signed   By: Franchot Gallo M.D.   On: 05/27/2021 08:19   DG Chest Portable 1 View  Result Date: 05/25/2021 CLINICAL DATA:  Breath for 1 week, hypertension, RIGHT pleural effusion by abdominal ultrasound EXAM: PORTABLE CHEST 1 VIEW COMPARISON:  Portable exam 0920 hours without priors for comparison FINDINGS: Enlargement of cardiac silhouette with pulmonary vascular congestion. Atherosclerotic calcification aorta. RIGHT pleural effusion and basilar atelectasis versus consolidation. Minimal atelectasis at LEFT base. Upper lungs clear. No pneumothorax. Bones demineralized. IMPRESSION: Enlargement of cardiac silhouette with pulmonary vascular congestion. RIGHT pleural effusion with basilar atelectasis and/or consolidation. Minimal LEFT basilar atelectasis. Electronically Signed   By: Lavonia Dana M.D.   On: 05/25/2021 10:03   ECHOCARDIOGRAM COMPLETE  Result Date: 05/26/2021    ECHOCARDIOGRAM REPORT   Patient Name:   Kevin Hobbs Date of Exam: 05/26/2021 Medical Rec #:  [id number]  Height:       67.0 in Accession #:    [id number]      Weight:       262.0 lb Date of Birth:  03/17/1941       BSA:          2.268 m Patient Age:    45 years        BP:           144/80 mmHg Patient Gender: M               HR:           88 bpm. Exam Location:  Inpatient Procedure: 2D Echo, Cardiac Doppler and Color Doppler Indications:    Pulmonary embolus  History:        Patient has no prior history of Echocardiogram examinations.                 Signs/Symptoms:Pulmonary embolus; Risk Factors:Diabetes,                 Hypertension, Former Smoker and Obesity.  Sonographer:    Dustin Flock RDCS Referring Phys: [place]  1. Left ventricular ejection fraction, by estimation, is 60 to 65%. The left ventricle has normal function. Left ventricular endocardial border not optimally defined to evaluate regional wall motion. The left ventricular internal cavity size was mildly to moderately dilated. Left ventricular diastolic parameters are indeterminate.  2. Right ventricular systolic function is normal. The right ventricular size is normal.  3. The mitral valve is normal in structure. No evidence of mitral valve regurgitation.  4. The aortic valve is grossly normal. Aortic valve regurgitation is not visualized. FINDINGS  Left Ventricle: Left ventricular ejection fraction, by estimation, is 60 to 65%. The left ventricle has normal function. Left ventricular endocardial border not optimally defined to evaluate regional wall motion. The left ventricular internal cavity size was mildly to moderately dilated. There is no left ventricular hypertrophy. Left ventricular diastolic parameters are indeterminate. Right Ventricle: The right ventricular size is normal. Right vetricular wall thickness was not well visualized. Right ventricular systolic function is normal. Left Atrium: Left atrial size was normal in size. Right Atrium: Right atrial  size was normal in size. Pericardium: There is no evidence of pericardial effusion. Mitral Valve: The mitral valve is normal in structure. No evidence of mitral valve regurgitation. Tricuspid Valve: The tricuspid valve is grossly normal. Tricuspid valve regurgitation is mild. Aortic Valve: The aortic valve is grossly normal. Aortic valve regurgitation is not visualized. Pulmonic Valve: The pulmonic valve was grossly normal. Pulmonic valve regurgitation is not visualized. Aorta: The aortic root and ascending aorta are structurally normal, with no evidence of dilitation. IAS/Shunts: The atrial septum is grossly normal.  LEFT VENTRICLE PLAX 2D LVIDd:         5.10 cm      Diastology LVIDs:         3.20 cm      LV e' medial:    6.96 cm/s LV PW:         1.20 cm      LV E/e' medial:  9.4 LV IVS:        1.00 cm      LV e' lateral:   6.09 cm/s LVOT diam:     2.40 cm      LV E/e' lateral: 10.7 LV SV:         79 LV SV Index:   35 LVOT Area:  4.52 cm  LV Volumes (MOD) LV vol d, MOD A4C: 157.0 ml LV vol s, MOD A4C: 66.8 ml LV SV MOD A4C:     157.0 ml RIGHT VENTRICLE RV Basal diam:  2.50 cm RV S prime:     16.80 cm/s TAPSE (M-mode): 2.4 cm LEFT ATRIUM             Index       RIGHT ATRIUM           Index LA diam:        3.10 cm 1.37 cm/m  RA Area:     13.80 cm LA Vol (A2C):   65.9 ml 29.05 ml/m RA Volume:   36.60 ml  16.14 ml/m LA Vol (A4C):   44.3 ml 19.53 ml/m LA Biplane Vol: 54.5 ml 24.03 ml/m  AORTIC VALVE LVOT Vmax:   110.00 cm/s LVOT Vmean:  64.400 cm/s LVOT VTI:    0.174 m  AORTA Ao Root diam: 2.90 cm MITRAL VALVE MV Area (PHT): 3.33 cm    SHUNTS MV Decel Time: 228 msec    Systemic VTI:  0.17 m MV E velocity: 65.10 cm/s  Systemic Diam: 2.40 cm MV A velocity: 78.40 cm/s MV E/A ratio:  0.83 Mertie Moores MD Electronically signed by Mertie Moores MD Signature Date/Time: 05/26/2021/1:17:37 PM    Final    VAS [place] LOWER EXTREMITY VENOUS (DVT)  Result Date: 05/27/2021  Lower Venous DVT Study Patient Name:  Kevin Hobbs  Date of Exam:   05/27/2021 Medical Rec #: [id number]        Accession #:    [id number] Date of Birth: [birth year]        Patient Gender: M Patient Age:   84 years Exam Location:  [facility] [place] Procedure:      VAS [place] LOWER EXTREMITY VENOUS (DVT) Referring Phys: Brand Males --------------------------------------------------------------------------------  Indications: Pulmonary embolism. Other Indications: Incrasing shortness of breath x1 week. Comparison Study: No previous exams Performing Technologist: Jody Hill RVT, RDMS  Examination Guidelines: A complete evaluation includes B-mode imaging, spectral Doppler, color Doppler, and power Doppler as needed of all accessible portions of each vessel. Bilateral testing is considered an integral part of a complete examination. Limited examinations for reoccurring indications may be performed as noted. The reflux portion of the exam is performed with the patient in reverse Trendelenburg.  +---------+---------------+---------+-----------+----------+--------------+ RIGHT    CompressibilityPhasicitySpontaneityPropertiesThrombus Aging +---------+---------------+---------+-----------+----------+--------------+ CFV      Full           Yes      Yes                                 +---------+---------------+---------+-----------+----------+--------------+ SFJ      Full                                                        +---------+---------------+---------+-----------+----------+--------------+ FV Prox  Full           Yes      Yes                                 +---------+---------------+---------+-----------+----------+--------------+ FV Mid   Full  Yes      Yes                                 +---------+---------------+---------+-----------+----------+--------------+ FV DistalFull           Yes      Yes                                 +---------+---------------+---------+-----------+----------+--------------+  PFV      Full                                                        +---------+---------------+---------+-----------+----------+--------------+ POP      Full           Yes      Yes                                 +---------+---------------+---------+-----------+----------+--------------+ PTV      Full                                                        +---------+---------------+---------+-----------+----------+--------------+ PERO     Full                                                        +---------+---------------+---------+-----------+----------+--------------+   +---------+---------------+---------+-----------+----------+--------------+ LEFT     CompressibilityPhasicitySpontaneityPropertiesThrombus Aging +---------+---------------+---------+-----------+----------+--------------+ CFV      Full           Yes      Yes                                 +---------+---------------+---------+-----------+----------+--------------+ SFJ      Full                                                        +---------+---------------+---------+-----------+----------+--------------+ FV Prox  Full           Yes      Yes                                 +---------+---------------+---------+-----------+----------+--------------+ FV Mid   Full           Yes      Yes                                 +---------+---------------+---------+-----------+----------+--------------+ FV DistalFull           Yes      Yes                                 +---------+---------------+---------+-----------+----------+--------------+  PFV      Full                                                        +---------+---------------+---------+-----------+----------+--------------+ POP      Full           Yes      Yes                                 +---------+---------------+---------+-----------+----------+--------------+ PTV      Full                                                         +---------+---------------+---------+-----------+----------+--------------+ PERO     None           No       No                   Acute          +---------+---------------+---------+-----------+----------+--------------+     Summary: BILATERAL: - No evidence of superficial venous thrombosis in the lower extremities, bilaterally. -No evidence of popliteal cyst, bilaterally. RIGHT: - There is no evidence of deep vein thrombosis in the lower extremity.  LEFT: - Findings consistent with acute deep vein thrombosis involving the left peroneal veins.  *See table(s) above for measurements and observations. Electronically signed by Jamelle Haring on 05/27/2021 at 5:52:25 PM.    Final    US THORACENTESIS ASP PLEURAL SPACE W/IMG GUIDE  Result Date: 06/05/2021 INDICATION: Patient with history of lung cancer, pulmonary embolus, malignant right pleural effusion. Request received for diagnostic and therapeutic right thoracentesis. EXAM: ULTRASOUND GUIDED DIAGNOSTIC AND THERAPEUTIC RIGHT THORACENTESIS MEDICATIONS: 1% lidocaine to skin and subcutaneous tissue COMPLICATIONS: None immediate. PROCEDURE: An ultrasound guided thoracentesis was thoroughly discussed with the patient and questions answered. The benefits, risks, alternatives and complications were also discussed. The patient understands and wishes to proceed with the procedure. Written consent was obtained. Ultrasound was performed to localize and mark an adequate pocket of fluid in the right chest. The area was then prepped and draped in the normal sterile fashion. 1% Lidocaine was used for local anesthesia. Under ultrasound guidance a 6 Fr Safe-T-Centesis catheter was introduced. Thoracentesis was performed. The catheter was removed and a dressing applied. FINDINGS: A total of approximately 20 cc of amber fluid was removed. Samples were sent to the laboratory as requested by the clinical team. Minimal free fluid was noted on today's  ultrasound of right pleural space. Despite catheter manipulation only the above amount of fluid could be removed today. IMPRESSION: Successful ultrasound guided diagnostic and therapeutic right thoracentesis yielding 20 cc of pleural fluid. Read by: Rowe Robert, PA-C Electronically Signed   By: Aletta Edouard M.D.   On: 06/05/2021 12:39   US THORACENTESIS ASP PLEURAL SPACE W/IMG GUIDE  Result Date: 05/26/2021 INDICATION: Patient with history of right lung mass, pulmonary embolus, dyspnea, right pleural effusion. Request received for diagnostic and therapeutic right thoracentesis. EXAM: ULTRASOUND GUIDED DIAGNOSTIC AND THERAPEUTIC RIGHT THORACENTESIS MEDICATIONS: 1% lidocaine to skin and subcutaneous tissue COMPLICATIONS: None immediate. PROCEDURE: An ultrasound  guided thoracentesis was thoroughly discussed with the patient and questions answered. The benefits, risks, alternatives and complications were also discussed. The patient understands and wishes to proceed with the procedure. Written consent was obtained. Ultrasound was performed to localize and mark an adequate pocket of fluid in the right chest. The area was then prepped and draped in the normal sterile fashion. 1% Lidocaine was used for local anesthesia. Under ultrasound guidance a 6 Fr Safe-T-Centesis catheter was introduced. Thoracentesis was performed. The catheter was removed and a dressing applied. FINDINGS: A total of approximately 1.3 liters of blood-tinged fluid was removed. Samples were sent to the laboratory as requested by the clinical team. IMPRESSION: Successful ultrasound guided diagnostic and therapeutic right thoracentesis yielding 1.3 liters of pleural fluid. Read by: Rowe Robert, PA-C Electronically Signed   By: Markus Daft M.D.   On: 05/26/2021 15:10    Assessment and Plan:   This is a very pleasant 80 year old African-American male patient with past medical history of smoking of 35 pack years quit about 20 years ago most  recently presented with acute pulmonary embolism and was found to have a right lung mass associated with pleural effusion.  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  He is now readmitted with chief complaint of worsening shortness of breath.  He had an interim PET imaging which suggest evidence of metastatic disease.  He was initially scheduled to see Dr.Mohamed on Wednesday but remains admitted because of ongoing shortness of breath.  On his second thoracentesis, there was only a scant sample of fluid obtained about 20 cc and cytology was sent again.  There appears to be no MRI of the brain done for staging. I discussed that this is an advanced non-small cell lung cancer however given his remote history of smoking, Cloyde Reams clears will have to be performed.  If his original cellblock does not have enough sample to run molecular's, he may need a second biopsy versus guardant testing of his blood.  We have not spoken about the available options although we have briefly mentioned that options for treatment include chemotherapy, immunotherapy and targeted therapy based on his molecular testing.  He understands this is an advanced lung cancer. I will engage nurse navigator team to work on molecular testing and PD-L1 on the original cytology obtained on August 24.  Unfortunately some of this testing takes around 2 to 3 weeks to be completed. Have briefly discussed about Pleurx if he is reaccumulating pleural fluid at a very quick pace or if its loculated.  He understands that once all results are available, we will treat him outpatient and will discuss further treatment options at that time. Thank you for consulting [place] the care of this patient.  Please do not hesitate to contact us with any additional questions or concerns   The length of time of the face-to-face encounter was 70 minutes. More than 50% of time was spent counseling and coordination of care.   Thank you for  this referral.

## 2021-06-07 NOTE — Progress Notes (Signed)
Kevin Hobbs  MLY:[id number] DOB: 13-Aug-1941 DOA: 06/04/2021 PCP: Robyne Peers, MD    Brief Narrative:  This 80 years old male with PMH significant for recently diagnosed acute pulmonary embolism, right pleural effusion and right middle lobe lung mass ,  type 2 diabetes presented in the ED with acute on chronic hypoxic respiratory failure. Patient was recently hospitalized at [facility] for acute hypoxic respiratory failure from 8/23-8/25.  Work-up during that hospitalization.  CTA chest showed evidence of acute PE, right pleural effusion right middle lobe lung mass.  Pulmonology consulted patient underwent thoracocentesis with removal of 1.3 L of pleural fluid blood-tinged in nature, patient was subsequently discharged on 2 L of supplemental oxygen and Eliquis.  Patient had outpatient PET scan . Patient reports worsening shortness of breath after he is discharged home. Repeat CT scan patient has recurrence of pleural effusion. IR consulted,  Patient underwent thoracocentesis, Labs pending. Pulmonology and oncology consulted.  Assessment & Plan:   Principal Problem:   Acute on chronic respiratory failure with hypoxia (HCC) Active Problems:   Pulmonary embolism (HCC)   Pleural effusion   SOB (shortness of breath)   Diabetes mellitus without complication (HCC)   Essential hypertension   Hyperkalemia   GERD (gastroesophageal reflux disease)  Acute on chronic hypoxic respiratory failure: Patient was recently discharged home on supplemental oxygen 2 L/m after being diagnosed with lung mass.   Presented with progressive shortness of breath for 3 to 4 days.  Now requiring 4 to 5 L of supplemental oxygen to maintain saturation above 90%.   This could be due to reaccumulation of effusion following thoracocentesis. This could be postobstructive pneumonia in the setting of right middle lobe lung mass. Continue supplemental oxygen to keep saturation above 90%.  He  successfully weaned down to 1 L/min, sats 92% Patient underwent successful thoracocentesis by IR.   20 cc of amber-colored fluid drained and sent for labs. Continue Zithromax, albuterol inhaler, prednisone. Continue to monitor clinical status.  Pulmonology signed off.  Lung mass: Oncology is awaiting cytology report. Formal consult pending.  Acute pulmonary embolism: Continue Eliquis  Hyperkalemia: > Improved. EKG without changes, Lokelma given. Hold losartan.  Continue to monitor.  Acute kidney injury: > Resolved. Baseline serum creatinine 1.1, presented with 1.55 Avoid nephrotoxic medications.    Diabetes Mellitus: Hold metformin, regular insulin sliding scale  Essential hypertension: Hold losartan, monitor blood pressure. Start amlodipine 5 mg daily.  GERD: Continue PPI   DVT prophylaxis:  Eliquis Code Status: Full code Family Communication: Wife at bedside Disposition Plan:   Status is: Inpatient  Remains inpatient appropriate because:Inpatient level of care appropriate due to severity of illness  Dispo: The patient is from: Home              Anticipated d/c is to: Home Health services.              Patient currently is not medically stable to d/c.   Difficult to place patient No  Consultants:  Pulmonology and oncology.  Procedures: Thoracocentesis by IR Antimicrobials:  Anti-infectives (From admission, onward)    Start     Dose/Rate Route Frequency Ordered Stop   06/08/21 1000  azithromycin (ZITHROMAX) tablet 500 mg        500 mg Oral Daily 06/07/21 0905     06/05/21 0300  azithromycin (ZITHROMAX) 500 mg in sodium chloride 0.9 % 250 mL IVPB  Status:  Discontinued  500 mg 250 mL/hr over 60 Minutes Intravenous Every 24 hours 06/05/21 0231 06/07/21 4235        Subjective: Patient was seen and examined at bedside.  No overnight events. Patient is sitting comfortably on the bed, O2 saturation has improved. SPO2 92% at 1 L at baseline.  Patient  denies any chest pain or shortness of breath.  Objective: Vitals:   06/07/21 0800 06/07/21 0826 06/07/21 0836 06/07/21 0900  BP:    (!) 161/72  Pulse:   95   Resp:   (!) 26 (!) 32  Temp: (!) 97.4 F (36.3 C)     TempSrc: Oral     SpO2:  95% 94%   Weight:      Height:        Intake/Output Summary (Last 24 hours) at 06/07/2021 1122 Last data filed at 06/07/2021 1000 Gross per 24 hour  Intake 486.43 ml  Output 925 ml  Net -438.57 ml   Filed Weights   06/05/21 0900  Weight: 107 kg    Examination:  General exam: Appears comfortable, thin, frail, deconditioned, not in any distress. Respiratory system: Clear to auscultation bilaterally, respiratory effort normal,  RR 20. Cardiovascular system: S1, S2 heard, regular rate and rhythm.  No murmur Gastrointestinal system: Abdomen is soft, nontender, nondistended, BS+ Central nervous system: Alert and oriented x 3. No focal neurological deficits. Extremities: No edema, no cyanosis, no clubbing. Skin: No rashes, lesions or ulcers Psychiatry: Judgement and insight appear normal. Mood & affect appropriate.     Data Reviewed: I have personally reviewed following labs and imaging studies  CBC: Recent Labs  Lab 06/04/21 1635 06/05/21 0430 06/06/21 0258 06/07/21 0236  WBC 14.6* 12.1* 17.6* 15.4*  NEUTROABS 10.9* 10.8*  --   --   HGB 10.2* 9.5* 9.5* 9.8*  HCT 32.2* 29.6* 28.9* 30.3*  MCV 89.0 87.6 85.3 86.3  PLT 609* 602* 583* 361*   Basic Metabolic Panel: Recent Labs  Lab 06/04/21 1635 06/04/21 1841 06/04/21 2146 06/05/21 0430 06/05/21 1624 06/06/21 0258 06/07/21 0236  NA 135  --  136 137  --  134* 144  K 5.9*  --  5.3* 5.6* 4.8 5.0 4.2  CL 105  --  107 109  --  104 113*  CO2 20*  --  20* 20*  --  20* 21*  GLUCOSE 112*  --  99 149*  --  167* 102*  BUN 63*  --  61* 61*  --  71* 55*  CREATININE 1.55*  --  1.49* 1.47*  --  1.50* 1.24  CALCIUM 9.1  --  8.7* 8.8*  --  8.7* 9.3  MG  --  1.9  --  2.2  --  2.3  --   PHOS   --   --   --  5.0*  --  4.1  --    GFR: Estimated Creatinine Clearance: 57.3 mL/min (by C-G formula based on SCr of 1.24 mg/dL). Liver Function Tests: Recent Labs  Lab 06/04/21 2146 06/05/21 0430  AST 164* 219*  ALT 91* 100*  ALKPHOS 145* 150*  BILITOT 2.5* 1.9*  PROT 7.3 7.5  ALBUMIN 2.2* 2.1*   No results for input(s): LIPASE, AMYLASE in the last 168 hours. No results for input(s): AMMONIA in the last 168 hours. Coagulation Profile: No results for input(s): INR, PROTIME in the last 168 hours. Cardiac Enzymes: No results for input(s): CKTOTAL, CKMB, CKMBINDEX, TROPONINI in the last 168 hours. BNP (last 3 results) No results for  input(s): PROBNP in the last 8760 hours. HbA1C: No results for input(s): HGBA1C in the last 72 hours. CBG: Recent Labs  Lab 06/06/21 1241 06/06/21 1634 06/06/21 2207 06/07/21 0721 06/07/21 1116  GLUCAP 171* 276* 184* 103* 194*   Lipid Profile: No results for input(s): CHOL, HDL, LDLCALC, TRIG, CHOLHDL, LDLDIRECT in the last 72 hours. Thyroid Function Tests: No results for input(s): TSH, T4TOTAL, FREET4, T3FREE, THYROIDAB in the last 72 hours. Anemia Panel: No results for input(s): VITAMINB12, FOLATE, FERRITIN, TIBC, IRON, RETICCTPCT in the last 72 hours. Sepsis Labs: Recent Labs  Lab 06/05/21 0430  PROCALCITON 0.64    Recent Results (from the past 240 hour(s))  SARS CORONAVIRUS 2 (TAT 6-24 HRS) Nasopharyngeal Nasopharyngeal Swab     Status: None   Collection Time: 06/04/21  3:32 PM   Specimen: Nasopharyngeal Swab  Result Value Ref Range Status   SARS Coronavirus 2 NEGATIVE NEGATIVE Final    Comment: (NOTE) SARS-CoV-2 target nucleic acids are NOT DETECTED.  The SARS-CoV-2 RNA is generally detectable in upper and lower respiratory specimens during the acute phase of infection. Negative results do not preclude SARS-CoV-2 infection, do not rule out co-infections with other pathogens, and should not be used as the sole basis for  treatment or other patient management decisions. Negative results must be combined with clinical observations, patient history, and epidemiological information. The expected result is Negative.  Fact Sheet for Patients: SugarRoll.be  Fact Sheet for Healthcare Providers: https://www.woods-mathews.com/  This test is not yet approved or cleared by the [place] FDA and  has been authorized for detection and/or diagnosis of SARS-CoV-2 by FDA under an Emergency Use Authorization (EUA). This EUA will remain  in effect (meaning this test can be used) for the duration of the COVID-19 declaration under Se ction 564(b)(1) of the Act, 21 U.S.C. section 360bbb-3(b)(1), unless the authorization is terminated or revoked sooner.  Performed at [facility], [place] [address] [place], [place] [address]   MRSA Next Gen by PCR, Nasal     Status: None   Collection Time: 06/05/21  8:50 AM   Specimen: Nasal Mucosa; Nasal Swab  Result Value Ref Range Status   MRSA by PCR Next Gen NOT DETECTED NOT DETECTED Final    Comment: (NOTE) The GeneXpert MRSA Assay (FDA approved for NASAL specimens only), is one component of a comprehensive MRSA colonization surveillance program. It is not intended to diagnose MRSA infection nor to guide or monitor treatment for MRSA infections. Test performance is not FDA approved in patients less than 27 years old. Performed at [facility], [place] [address], [place], [place] [address]   Body fluid culture w Gram Stain     Status: None (Preliminary result)   Collection Time: 06/05/21 12:27 PM   Specimen: PATH Cytology Pleural fluid  Result Value Ref Range Status   Specimen Description   Final    PLEURAL RIGHT Performed at [place] [address] [place], [place] [address]    Special Requests   Final    NONE Performed at [facility], [place] [address],  [place], [place] [address]    Gram Stain   Final    RARE WBC PRESENT,BOTH PMN AND MONONUCLEAR NO ORGANISMS SEEN    Culture   Final    NO GROWTH 2 DAYS Performed at [facility], [place] [address] [place], [place] [address]    Report Status PENDING  Incomplete    Radiology Studies: DG  Chest 1 View  Result Date: 06/05/2021 CLINICAL DATA:  Status post thoracentesis. EXAM: CHEST  1 VIEW COMPARISON:  Chest radiograph dated 06/04/2021. FINDINGS: The heart is obscured. A moderate right pleural effusion with associated atelectasis/consolidation appears unchanged. There is no right pneumothorax. The left lung is clear without pleural effusion or pneumothorax. IMPRESSION: No significant change in a moderate right pleural effusion with associated atelectasis/consolidation. No pneumothorax. Electronically Signed   By: Zerita Boers M.D.   On: 06/05/2021 12:52   DG CHEST PORT 1 VIEW  Result Date: 06/07/2021 CLINICAL DATA:  Right lower lung mass with component of pleural effusion. EXAM: PORTABLE CHEST 1 VIEW COMPARISON:  Chest x-rays on 06/04/2021 and 06/05/2021. PET scan on 06/03/2021. FINDINGS: Stable heart size and aortic tortuosity. Extensive mass-like density and consolidation of the right lower lung noted which has been previously demonstrated to be a large mass at the right lung base. Component of associated right pleural fluid may be present. However, recent ultrasound did not demonstrate a large pleural effusion. No pneumothorax or pulmonary edema. IMPRESSION: Mass-like opacity and consolidation of the right lower lung. Potential associated right pleural fluid. Electronically Signed   By: Aletta Edouard M.D.   On: 06/07/2021 08:43   US THORACENTESIS ASP PLEURAL SPACE W/IMG GUIDE  Result Date: 06/05/2021 INDICATION: Patient with history of lung cancer, pulmonary embolus, malignant right pleural effusion. Request received for diagnostic and therapeutic right thoracentesis. EXAM: ULTRASOUND GUIDED  DIAGNOSTIC AND THERAPEUTIC RIGHT THORACENTESIS MEDICATIONS: 1% lidocaine to skin and subcutaneous tissue COMPLICATIONS: None immediate. PROCEDURE: An ultrasound guided thoracentesis was thoroughly discussed with the patient and questions answered. The benefits, risks, alternatives and complications were also discussed. The patient understands and wishes to proceed with the procedure. Written consent was obtained. Ultrasound was performed to localize and mark an adequate pocket of fluid in the right chest. The area was then prepped and draped in the normal sterile fashion. 1% Lidocaine was used for local anesthesia. Under ultrasound guidance a 6 Fr Safe-T-Centesis catheter was introduced. Thoracentesis was performed. The catheter was removed and a dressing applied. FINDINGS: A total of approximately 20 cc of amber fluid was removed. Samples were sent to the laboratory as requested by the clinical team. Minimal free fluid was noted on today's ultrasound of right pleural space. Despite catheter manipulation only the above amount of fluid could be removed today. IMPRESSION: Successful ultrasound guided diagnostic and therapeutic right thoracentesis yielding 20 cc of pleural fluid. Read by: Rowe Robert, PA-C Electronically Signed   By: Aletta Edouard M.D.   On: 06/05/2021 12:39    Scheduled Meds:  amLODipine  5 mg Oral Daily   apixaban  5 mg Oral BID   [START ON 06/08/2021] azithromycin  500 mg Oral Daily   Chlorhexidine Gluconate Cloth  6 each Topical Daily   feeding supplement  237 mL Oral BID BM   insulin aspart  0-9 Units Subcutaneous TID WC   mouth rinse  15 mL Mouth Rinse BID   mometasone-formoterol  2 puff Inhalation BID   multivitamin with minerals  1 tablet Oral Daily   pantoprazole  40 mg Oral Daily   predniSONE  40 mg Oral Q breakfast   simvastatin  10 mg Oral Daily   umeclidinium bromide  1 puff Inhalation Daily   Continuous Infusions:     LOS: 3 days    Time spent: 25  mins    Shawna Clamp, MD [facility]   If 7PM-7AM, please contact night-coverage

## 2021-06-07 NOTE — Evaluation (Signed)
Occupational Therapy Evaluation Patient Details Name: Kevin Hobbs MRN: [id number] DOB: [birth year] Today's Date: 06/07/2021    History of Present Illness This 80 years old male with PMH significant for recently diagnosed acute pulmonary embolism, DVT,  pleural effusion and right middle lobe lung mass on ,  type 2 diabetes presented in the ED 06/04/21 with  acute hypoxic respiratory failure. On 2 L home O2.   Clinical Impression   Patient is a 80 year old male who was admitted for above. Patient was living at home with wife PLOF. Currently, patient is min A for mobility with mod-max for ADLs. Patient was noted to have decreased functional activity tolerance, decreased endurance, decreased cognition, and increased need for supplemental O2 impacting patients ability to engage in ADLs. Patient would continue to benefit from skilled OT services at this time while admitted and after d/c to address noted deficits in order to improve overall safety and independence in ADLs.      Follow Up Recommendations  Home health OT    Equipment Recommendations  None recommended by OT    Recommendations for Other Services       Precautions / Restrictions Precautions Precautions: Fall Precaution Comments: monitor sats Restrictions Weight Bearing Restrictions: No      Mobility Bed Mobility Overal bed mobility: Needs Assistance Bed Mobility: Supine to Sit     Supine to sit: HOB elevated;Min guard     General bed mobility comments: patient had right leg off the edge of bed at start of session with wife reporting patient has been doign that all night. patient needed multimodal cues for turning to sit upright on edge of bed.    Transfers Overall transfer level: Needs assistance Equipment used: Rolling walker (2 wheeled) Transfers: Sit to/from Stand Sit to Stand: Min assist         General transfer comment: steady assist to rise to rolling walker    Balance Overall balance assessment: Needs  assistance Sitting-balance support: No upper extremity supported;Feet supported Sitting balance-Leahy Scale: Good     Standing balance support: During functional activity;Bilateral upper extremity supported Standing balance-Leahy Scale: Fair Standing balance comment: support of Rw                           ADL either performed or assessed with clinical judgement   ADL                                               Vision Patient Visual Report: No change from baseline       Perception     Praxis      Pertinent Vitals/Pain Pain Assessment: No/denies pain     Hand Dominance Right   Extremity/Trunk Assessment Upper Extremity Assessment Upper Extremity Assessment: Overall WFL for tasks assessed   Lower Extremity Assessment Lower Extremity Assessment: Defer to PT evaluation   Cervical / Trunk Assessment Cervical / Trunk Assessment: Normal   Communication Communication Communication: No difficulties   Cognition Arousal/Alertness: Awake/alert Behavior During Therapy: WFL for tasks assessed/performed Overall Cognitive Status: Within Functional Limits for tasks assessed                                 General Comments: patient was noted to have delays in answers  with wife answering questions for patient at times during session.   General Comments       Exercises     Shoulder Instructions      Home Living Family/patient expects to be discharged to:: Private residence Living Arrangements: Spouse/significant other Available Help at Discharge: Family Type of Home: House Home Access: Stairs to enter [employer] of Steps: 1   Home Layout: Two level;Able to live on main level with bedroom/bathroom Alternate Level Stairs-Number of Steps: converted garage to BR   Bathroom Shower/Tub: Walk-in shower         Home Equipment: Environmental consultant - 2 wheels          Prior Functioning/Environment Level of Independence:  Needs assistance  Gait / Transfers Assistance Needed: uses RW in home, wife with pt. 24/7 ADL's / Homemaking Assistance Needed: wife assists with bath abd dressing recently, prior to 8/23, pt was independnent            OT Problem List: Decreased strength;Decreased knowledge of use of DME or AE;Decreased activity tolerance;Impaired balance (sitting and/or standing);Decreased safety awareness;Decreased coordination      OT Treatment/Interventions: Self-care/ADL training;DME and/or AE instruction;Therapeutic activities;Balance training;Patient/family education    OT Goals(Current goals can be found in the care plan section) Acute Rehab OT Goals Patient Stated Goal: go home with some extra help OT Goal Formulation: With patient/family Time For Goal Achievement: 06/08/2021 Potential to Achieve Goals: Good  OT Frequency: Min 2X/week   Barriers to D/C:            Co-evaluation PT/OT/SLP Co-Evaluation/Treatment: Yes Reason for Co-Treatment: For patient/therapist safety;To address functional/ADL transfers PT goals addressed during session: Mobility/safety with mobility OT goals addressed during session: ADL's and self-care      AM-PAC OT "6 Clicks" Daily Activity     Outcome Measure Help from another person eating meals?: A Little Help from another person taking care of personal grooming?: A Little Help from another person toileting, which includes using toliet, bedpan, or urinal?: A Lot Help from another person bathing (including washing, rinsing, drying)?: A Lot Help from another person to put on and taking off regular upper body clothing?: A Little Help from another person to put on and taking off regular lower body clothing?: A Lot 6 Click Score: 15   End of Session Equipment Utilized During Treatment: Gait belt;Rolling walker Nurse Communication: Other (comment) (nurse cleared patient to participate in session)  Activity Tolerance: Patient tolerated treatment well Patient  left: in chair;with call bell/phone within reach  OT Visit Diagnosis: Unsteadiness on feet (R26.81);Muscle weakness (generalized) (M62.81)                Time: 9476-5465 OT Time Calculation (min): 30 min Charges:  OT General Charges $OT Visit: 1 Visit OT Evaluation $OT Eval Low Complexity: 1 Low  Leota Sauers, MS Acute Rehabilitation Department Office# [phone] Pager# [phone]   [place] 06/07/2021, 10:45 AM

## 2021-06-07 NOTE — Progress Notes (Signed)
PHARMACIST - PHYSICIAN COMMUNICATION  CONCERNING: Antibiotic IV to Oral Route Change Policy  RECOMMENDATION: This patient is receiving azithromycin by the intravenous route.  Based on criteria approved by the Pharmacy and Therapeutics Committee, the antibiotic(s) is/are being converted to the equivalent oral dose form(s).   DESCRIPTION: These criteria include:  Patient being treated for a respiratory tract infection, urinary tract infection, cellulitis or clostridium difficile associated diarrhea if on metronidazole  The patient is not neutropenic and does not exhibit a GI malabsorption state  The patient is eating (either orally or via tube) and/or has been taking other orally administered medications for a least 24 hours  The patient is improving clinically and has a Tmax < 100.5  If you have questions about this conversion, please contact the Pharmacy Department  []  ( [phone] )  [place] []  ( [phone] )  [facility] []  ( [phone] )  [place] []  ( [phone] )  [facility] [x]  ( [phone] )  [facility]  

## 2021-06-08 ENCOUNTER — Inpatient Hospital Stay (HOSPITAL_COMMUNITY): Payer: PPO

## 2021-06-08 ENCOUNTER — Encounter: Payer: Self-pay | Admitting: *Deleted

## 2021-06-08 DIAGNOSIS — J9621 Acute and chronic respiratory failure with hypoxia: Secondary | ICD-10-CM | POA: Diagnosis not present

## 2021-06-08 LAB — BASIC METABOLIC PANEL
Anion gap: 9 (ref 5–15)
BUN: 37 mg/dL — ABNORMAL HIGH (ref 8–23)
CO2: 21 mmol/L — ABNORMAL LOW (ref 22–32)
Calcium: 9.1 mg/dL (ref 8.9–10.3)
Chloride: 115 mmol/L — ABNORMAL HIGH (ref 98–111)
Creatinine, Ser: 1.05 mg/dL (ref 0.61–1.24)
GFR, Estimated: >60 mL/min (ref >60)
Glucose, Bld: 105 mg/dL — ABNORMAL HIGH (ref 70–99)
Potassium: 4.4 mmol/L (ref 3.5–5.1)
Sodium: 145 mmol/L (ref 135–145)

## 2021-06-08 LAB — GLUCOSE, CAPILLARY
Glucose-Capillary: 96 mg/dL (ref 70–99)
Glucose-Capillary: 198 mg/dL — ABNORMAL HIGH (ref 70–99)
Glucose-Capillary: 227 mg/dL — ABNORMAL HIGH (ref 70–99)
Glucose-Capillary: 146 mg/dL — ABNORMAL HIGH (ref 70–99)

## 2021-06-08 LAB — BODY FLUID CULTURE W GRAM STAIN: Culture: NO GROWTH

## 2021-06-08 LAB — MAGNESIUM: Magnesium: 1.8 mg/dL (ref 1.7–2.4)

## 2021-06-08 LAB — PHOSPHORUS: Phosphorus: 3.3 mg/dL (ref 2.5–4.6)

## 2021-06-08 MED ORDER — DM-GUAIFENESIN ER 30-600 MG PO TB12
1.0000 | ORAL_TABLET | Freq: Two times a day (BID) | ORAL | Status: DC | PRN
  Administered 2021-06-08: 1 via ORAL
  Filled 2021-06-08: qty 1

## 2021-06-08 MED ORDER — LOSARTAN POTASSIUM 50 MG PO TABS
100.0000 mg | ORAL_TABLET | Freq: Every day | ORAL | Status: DC
  Administered 2021-06-08 – 2021-06-10 (×3): 100 mg via ORAL
  Filled 2021-06-08 (×4): qty 2

## 2021-06-08 MED ORDER — AMLODIPINE BESYLATE 10 MG PO TABS
10.0000 mg | ORAL_TABLET | Freq: Every day | ORAL | Status: DC
  Administered 2021-06-08 – 2021-06-15 (×8): 10 mg via ORAL
  Filled 2021-06-08 (×2): qty 1
  Filled 2021-06-08: qty 2
  Filled 2021-06-08 (×6): qty 1

## 2021-06-08 MED ORDER — GUAIFENESIN-DM 100-10 MG/5ML PO SYRP
5.0000 mL | ORAL_SOLUTION | ORAL | Status: DC | PRN
  Administered 2021-06-08: 5 mL via ORAL
  Filled 2021-06-08: qty 10

## 2021-06-08 MED ORDER — ALBUTEROL SULFATE (2.5 MG/3ML) 0.083% IN NEBU
2.5000 mg | INHALATION_SOLUTION | Freq: Three times a day (TID) | RESPIRATORY_TRACT | Status: DC
  Administered 2021-06-09: 2.5 mg via RESPIRATORY_TRACT
  Filled 2021-06-08: qty 3

## 2021-06-08 MED ORDER — GADOBUTROL 1 MMOL/ML IV SOLN
10.0000 mL | Freq: Once | INTRAVENOUS | Status: AC | PRN
  Administered 2021-06-08: 10 mL via INTRAVENOUS

## 2021-06-08 NOTE — Progress Notes (Signed)
Per Dr. Chryl Heck, I notified pathology to send recent cytology case [id number] for molecular and PDL 1 testing to [facility].

## 2021-06-08 NOTE — Progress Notes (Signed)
Physical Therapy Treatment Patient Details Name: Kevin Hobbs MRN: [id number] DOB: April 15, 1941 Today's Date: 06/08/2021    History of Present Illness This 80 years old male with PMH significant for recently diagnosed acute pulmonary embolism, DVT,  pleural effusion and right middle lobe lung mass on ,  type 2 diabetes presented in the ED 06/04/21 with  acute hypoxic respiratory failure. On 2 L home O2.    PT Comments    Pt in bed on 2 lts at rest 95%.  Daughter at bedside.  General Comments: AxO x 2 "slightly foggy" but cooperative  Feeling "tired".  Assisted OOB to amb.  General bed mobility comments: Max Assist for upper body and to complete scooting to EOB.  Once upright pt able to static sit x 3 min but needed Min Assist for support. General transfer comment: pt was able to self rise from elevated bed at [facility] but did need increased assist with stand to sit to control decend. General Gait Details: Daughter present and assist by following with recliner.  Remained on 2 lts which is his prior oxygen need at home.  Tolerated an increased distance.  Avg HR 109 and sats 94% on 2 lts but distance limited by 3/4 dyspnea.  Improved mobility but still very limited.   Pt plans to return home and will need 24/7 assist/care.  Follow Up Recommendations  Home health PT     Equipment Recommendations  None recommended by PT    Recommendations for Other Services       Precautions / Restrictions Precautions Precautions: Fall Precaution Comments: monitor sats    Mobility  Bed Mobility Overal bed mobility: Needs Assistance Bed Mobility: Supine to Sit     Supine to sit: Max assist     General bed mobility comments: Max Assist for upper body and to complete scooting to EOB.  Once upright pt able to static sit x 3 min but needed Min Assist for support.    Transfers Overall transfer level: Needs assistance Equipment used: Rolling walker (2 wheeled) Transfers: Sit to/from [employer] Sit to Stand: Min guard;Supervision         General transfer comment: pt was able to self rise from elevated bed at [facility] but did need increased assist with stand to sit to control decend.  Ambulation/Gait Ambulation/Gait assistance: Min assist;+2 physical assistance;+2 safety/equipment Gait Distance (Feet): 24 Feet Assistive device: Rolling walker (2 wheeled) Gait Pattern/deviations: Step-to pattern;Decreased step length - left;Decreased step length - right Gait velocity: decreased   General Gait Details: Daughter present and assist by following with recliner.  Remained on 2 lts which is his prior oxygen need at home.  Tolerated an increased distance.  Avg HR 109 and sats 94% on 2 lts but distance limited by 3/4 dyspnea.   Stairs             Wheelchair Mobility    Modified Rankin (Stroke Patients Only)       Balance                                            Cognition Arousal/Alertness: Awake/alert   Overall Cognitive Status: Impaired/Different from baseline  General Comments: AxO x 2 "slightly foggy" but cooperative      Exercises      General Comments        Pertinent Vitals/Pain Pain Assessment: No/denies pain    Home Living                      Prior Function            PT Goals (current goals can now be found in the care plan section) Progress towards PT goals: Progressing toward goals    Frequency    Min 3X/week      PT Plan Current plan remains appropriate    Co-evaluation              AM-PAC PT "6 Clicks" Mobility   Outcome Measure  Help needed turning from your back to your side while in a flat bed without using bedrails?: A Little Help needed moving from lying on your back to sitting on the side of a flat bed without using bedrails?: A Little Help needed moving to and from a bed to a chair (including a wheelchair)?: A  Little Help needed standing up from a chair using your arms (e.g., wheelchair or bedside chair)?: A Little Help needed to walk in hospital room?: A Little Help needed climbing 3-5 steps with a railing? : A Lot 6 Click Score: 17    End of Session Equipment Utilized During Treatment: Gait belt;Oxygen Activity Tolerance: Patient limited by fatigue Patient left: in chair;with call bell/phone within reach;with chair alarm set;with family/visitor present Nurse Communication: Mobility status PT Visit Diagnosis: Difficulty in walking, not elsewhere classified (R26.2)     Time: 6967-8938 PT Time Calculation (min) (ACUTE ONLY): 30 min  Charges:  $Gait Training: 8-22 mins $Therapeutic Activity: 8-22 mins                    Rica Koyanagi  PTA Acute  Rehabilitation Services Pager      [phone] Office      [phone]

## 2021-06-08 NOTE — Care Management Important Message (Signed)
Important Message  Patient Details IM Letter given to the Patient. Name: Kevin Hobbs MRN: [id number] Date of Birth: [birth year]   Medicare Important Message Given:  Yes     [name] 06/08/2021, 12:55 PM

## 2021-06-08 NOTE — Progress Notes (Signed)
I received an update from pathology that PDL 1 was sent. They are waiting for tissue to come back to send for moleculars.

## 2021-06-08 NOTE — Progress Notes (Signed)
Kevin Hobbs  BMW:[id number] DOB: 02/06/1941 DOA: 06/04/2021 PCP: Robyne Peers, MD    Brief Narrative:  This 80 years old male with PMH significant for recently diagnosed acute pulmonary embolism, right pleural effusion and right middle lobe lung mass ,  type 2 diabetes presented in the ED with acute on chronic hypoxic respiratory failure. Patient was recently hospitalized at [facility] for acute hypoxic respiratory failure from 8/23-8/25.  Work-up during that hospitalization.  CTA chest showed evidence of acute PE, right pleural effusion right middle lobe lung mass.  Pulmonology consulted patient underwent thoracocentesis with removal of 1.3 L of pleural fluid blood-tinged in nature, patient was subsequently discharged on 2 L of supplemental oxygen and Eliquis.  Patient had outpatient PET scan . Patient reports worsening shortness of breath after he is discharged home. Repeat CT scan patient has recurrence of pleural effusion. IR consulted,  Patient underwent thoracocentesis, cytology confirms non-small cell lung cancer.  Pulmonology and oncology consulted.  Oncology recommended MRI brain to rule out mets.  Assessment & Plan:   Principal Problem:   Acute on chronic respiratory failure with hypoxia (HCC) Active Problems:   Pulmonary embolism (HCC)   Pleural effusion   SOB (shortness of breath)   Diabetes mellitus without complication (HCC)   Essential hypertension   Hyperkalemia   GERD (gastroesophageal reflux disease)  Acute on chronic hypoxic respiratory failure: Patient was recently discharged home on supplemental oxygen 2 L/m after being diagnosed with lung mass.   Presented with progressive shortness of breath for 3 to 4 days. Now requiring 4 to 5 L of supplemental oxygen to maintain saturation above 90%.   This could be due to reaccumulation of effusion following thoracocentesis. This could be postobstructive pneumonia in the setting of right middle lobe  lung mass. Continue supplemental oxygen to keep saturation above 90%.  Patient underwent successful thoracocentesis by IR.   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 Continue Zithromax x 5 days  ( Day 5/5) , prednisone x 5 days ( Day 4/5) Continue albuterol as needed.  He is back to his baseline oxygen requirement. Continue to monitor clinical status.  Pulmonology signed off.  Lung mass: Oncology consulted. States patient has advanced non-small cell lung cancer. MRI brain ordered for restaging.  Cytology sent for molecular testing  Acute pulmonary embolism: Continue Eliquis.  Hyperkalemia: > Improved. EKG without changes, Lokelma given. Losartan was on hold.  Continue to monitor. Resumed losartan.  Acute kidney injury: > Resolved. Baseline serum creatinine 1.1, presented with 1.55 Avoid nephrotoxic medications.    Diabetes Mellitus: Hold metformin, regular insulin sliding scale  Essential hypertension: Continue losartan and amlodipine.  GERD: Continue PPI   DVT prophylaxis:  Eliquis Code Status: Full code Family Communication: Wife at bedside Disposition Plan:   Status is: Inpatient  Remains inpatient appropriate because:Inpatient level of care appropriate due to severity of illness  Dispo: The patient is from: Home              Anticipated d/c is to: Home Health services.              Patient currently is not medically stable to d/c.   Difficult to place patient No  Consultants:  Pulmonology and oncology.  Procedures: Thoracocentesis by IR Antimicrobials:  Anti-infectives (From admission, onward)    Start     Dose/Rate Route Frequency Ordered Stop   06/08/21 1000  azithromycin (ZITHROMAX) tablet 500 mg  500 mg Oral Daily 06/07/21 0905     06/05/21 0300  azithromycin (ZITHROMAX) 500 mg in sodium chloride 0.9 % 250 mL IVPB  Status:  Discontinued        500 mg 250 mL/hr over 60 Minutes Intravenous Every 24 hours 06/05/21 0231 06/07/21 0905         Subjective: Patient was seen and examined at bedside.  No overnight events. Patient is sitting comfortably on the bed, O2 saturation is significantly improved. SPO2 92% at 2 L at baseline.  Patient denies any chest pain or shortness of breath.  Objective: Vitals:   06/08/21 0731 06/08/21 0733 06/08/21 0800 06/08/21 1245  BP:   138/68 (!) 143/74  Pulse:   92 80  Resp:   20   Temp:   98.6 F (37 C) 97.6 F (36.4 C)  TempSrc:   Oral Oral  SpO2: 95% 95% 96% 92%  Weight:      Height:        Intake/Output Summary (Last 24 hours) at 06/08/2021 1513 Last data filed at 06/08/2021 0942 Gross per 24 hour  Intake 120 ml  Output 700 ml  Net -580 ml   Filed Weights   06/05/21 0900 06/08/21 0446  Weight: 107 kg 107.8 kg    Examination:  General exam: Appears comfortable, deconditioned, not in any acute distress. Respiratory system: Clear to auscultation bilaterally, respiratory effort normal,  RR 20. Cardiovascular system: S1, S2 heard, regular rate and rhythm.  No murmur Gastrointestinal system: Abdomen is soft, nontender, nondistended, BS+ Central nervous system: Alert and oriented x 3. No focal neurological deficits. Extremities: No edema, no cyanosis, no clubbing. Skin: No rashes, lesions or ulcers Psychiatry: Judgement and insight appear normal. Mood & affect appropriate.     Data Reviewed: I have personally reviewed following labs and imaging studies  CBC: Recent Labs  Lab 06/04/21 1635 06/05/21 0430 06/06/21 0258 06/07/21 0236  WBC 14.6* 12.1* 17.6* 15.4*  NEUTROABS 10.9* 10.8*  --   --   HGB 10.2* 9.5* 9.5* 9.8*  HCT 32.2* 29.6* 28.9* 30.3*  MCV 89.0 87.6 85.3 86.3  PLT 609* 602* 583* 161*   Basic Metabolic Panel: Recent Labs  Lab 06/04/21 1841 06/04/21 2146 06/05/21 0430 06/05/21 1624 06/06/21 0258 06/07/21 0236 06/08/21 0413  NA  --  136 137  --  134* 144 145  K  --  5.3* 5.6* 4.8 5.0 4.2 4.4  CL  --  107 109  --  104 113* 115*  CO2  --   20* 20*  --  20* 21* 21*  GLUCOSE  --  99 149*  --  167* 102* 105*  BUN  --  61* 61*  --  71* 55* 37*  CREATININE  --  1.49* 1.47*  --  1.50* 1.24 1.05  CALCIUM  --  8.7* 8.8*  --  8.7* 9.3 9.1  MG 1.9  --  2.2  --  2.3  --  1.8  PHOS  --   --  5.0*  --  4.1  --  3.3   GFR: Estimated Creatinine Clearance: 67.9 mL/min (by C-G formula based on SCr of 1.05 mg/dL). Liver Function Tests: Recent Labs  Lab 06/04/21 2146 06/05/21 0430  AST 164* 219*  ALT 91* 100*  ALKPHOS 145* 150*  BILITOT 2.5* 1.9*  PROT 7.3 7.5  ALBUMIN 2.2* 2.1*   No results for input(s): LIPASE, AMYLASE in the last 168 hours. No results for input(s): AMMONIA in the last 168  hours. Coagulation Profile: No results for input(s): INR, PROTIME in the last 168 hours. Cardiac Enzymes: No results for input(s): CKTOTAL, CKMB, CKMBINDEX, TROPONINI in the last 168 hours. BNP (last 3 results) No results for input(s): PROBNP in the last 8760 hours. HbA1C: No results for input(s): HGBA1C in the last 72 hours. CBG: Recent Labs  Lab 06/07/21 1116 06/07/21 1614 06/07/21 2022 06/08/21 0737 06/08/21 1110  GLUCAP 194* 179* 169* 96 198*   Lipid Profile: No results for input(s): CHOL, HDL, LDLCALC, TRIG, CHOLHDL, LDLDIRECT in the last 72 hours. Thyroid Function Tests: No results for input(s): TSH, T4TOTAL, FREET4, T3FREE, THYROIDAB in the last 72 hours. Anemia Panel: No results for input(s): VITAMINB12, FOLATE, FERRITIN, TIBC, IRON, RETICCTPCT in the last 72 hours. Sepsis Labs: Recent Labs  Lab 06/05/21 0430  PROCALCITON 0.64    Recent Results (from the past 240 hour(s))  SARS CORONAVIRUS 2 (TAT 6-24 HRS) Nasopharyngeal Nasopharyngeal Swab     Status: None   Collection Time: 06/04/21  3:32 PM   Specimen: Nasopharyngeal Swab  Result Value Ref Range Status   SARS Coronavirus 2 NEGATIVE NEGATIVE Final    Comment: (NOTE) SARS-CoV-2 target nucleic acids are NOT DETECTED.  The SARS-CoV-2 RNA is generally detectable  in upper and lower respiratory specimens during the acute phase of infection. Negative results do not preclude SARS-CoV-2 infection, do not rule out co-infections with other pathogens, and should not be used as the sole basis for treatment or other patient management decisions. Negative results must be combined with clinical observations, patient history, and epidemiological information. The expected result is Negative.  Fact Sheet for Patients: SugarRoll.be  Fact Sheet for Healthcare Providers: https://www.woods-mathews.com/  This test is not yet approved or cleared by the [place] FDA and  has been authorized for detection and/or diagnosis of SARS-CoV-2 by FDA under an Emergency Use Authorization (EUA). This EUA will remain  in effect (meaning this test can be used) for the duration of the COVID-19 declaration under Se ction 564(b)(1) of the Act, 21 U.S.C. section 360bbb-3(b)(1), unless the authorization is terminated or revoked sooner.  Performed at [facility], [place] [address], [place], [place] [address]   MRSA Next Gen by PCR, Nasal     Status: None   Collection Time: 06/05/21  8:50 AM   Specimen: Nasal Mucosa; Nasal Swab  Result Value Ref Range Status   MRSA by PCR Next Gen NOT DETECTED NOT DETECTED Final    Comment: (NOTE) The GeneXpert MRSA Assay (FDA approved for NASAL specimens only), is one component of a comprehensive MRSA colonization surveillance program. It is not intended to diagnose MRSA infection nor to guide or monitor treatment for MRSA infections. Test performance is not FDA approved in patients less than 63 years old. Performed at [facility], [place] [address], [place], [place] [address]   Body fluid culture w Gram Stain     Status: None   Collection Time: 06/05/21 12:27 PM   Specimen: PATH Cytology Pleural fluid  Result Value Ref Range Status   Specimen Description   Final     PLEURAL RIGHT Performed at [place] [address] [place], [place] [address]    Special Requests   Final    NONE Performed at [facility], [place] [address] [place], [place] [address]    Gram Stain   Final    RARE WBC PRESENT,BOTH PMN AND MONONUCLEAR NO ORGANISMS SEEN    Culture   Final  NO GROWTH 3 DAYS Performed at [facility], [place] [address] [place], [place] [address]    Report Status 06/08/2021 FINAL  Final    Radiology Studies: DG CHEST PORT 1 VIEW  Result Date: 06/07/2021 CLINICAL DATA:  Right lower lung mass with component of pleural effusion. EXAM: PORTABLE CHEST 1 VIEW COMPARISON:  Chest x-rays on 06/04/2021 and 06/05/2021. PET scan on 06/03/2021. FINDINGS: Stable heart size and aortic tortuosity. Extensive mass-like density and consolidation of the right lower lung noted which has been previously demonstrated to be a large mass at the right lung base. Component of associated right pleural fluid may be present. However, recent ultrasound did not demonstrate a large pleural effusion. No pneumothorax or pulmonary edema. IMPRESSION: Mass-like opacity and consolidation of the right lower lung. Potential associated right pleural fluid. Electronically Signed   By: Aletta Edouard M.D.   On: 06/07/2021 08:43    Scheduled Meds:  amLODipine  10 mg Oral Daily   apixaban  5 mg Oral BID   azithromycin  500 mg Oral Daily   Chlorhexidine Gluconate Cloth  6 each Topical Daily   feeding supplement  237 mL Oral BID BM   insulin aspart  0-9 Units Subcutaneous TID WC   losartan  100 mg Oral Daily   mouth rinse  15 mL Mouth Rinse BID   mometasone-formoterol  2 puff Inhalation BID   multivitamin with minerals  1 tablet Oral Daily   pantoprazole  40 mg Oral Daily   predniSONE  40 mg Oral Q breakfast   simvastatin  10 mg Oral Daily   umeclidinium bromide  1 puff Inhalation Daily   Continuous Infusions:     LOS: 4 days    Time spent:  25 mins    Shawna Clamp, MD [facility]   If 7PM-7AM, please contact night-coverage

## 2021-06-09 ENCOUNTER — Inpatient Hospital Stay: Payer: PPO | Attending: Internal Medicine | Admitting: Internal Medicine

## 2021-06-09 ENCOUNTER — Ambulatory Visit: Payer: PPO | Admitting: Pulmonary Disease

## 2021-06-09 ENCOUNTER — Inpatient Hospital Stay: Payer: PPO

## 2021-06-09 DIAGNOSIS — J90 Pleural effusion, not elsewhere classified: Secondary | ICD-10-CM | POA: Diagnosis not present

## 2021-06-09 DIAGNOSIS — J9621 Acute and chronic respiratory failure with hypoxia: Secondary | ICD-10-CM | POA: Diagnosis not present

## 2021-06-09 DIAGNOSIS — E119 Type 2 diabetes mellitus without complications: Secondary | ICD-10-CM | POA: Diagnosis not present

## 2021-06-09 DIAGNOSIS — I639 Cerebral infarction, unspecified: Secondary | ICD-10-CM | POA: Diagnosis not present

## 2021-06-09 LAB — BASIC METABOLIC PANEL
Anion gap: 9 (ref 5–15)
BUN: 28 mg/dL — ABNORMAL HIGH (ref 8–23)
CO2: 22 mmol/L (ref 22–32)
Calcium: 9.3 mg/dL (ref 8.9–10.3)
Chloride: 113 mmol/L — ABNORMAL HIGH (ref 98–111)
Creatinine, Ser: 0.99 mg/dL (ref 0.61–1.24)
GFR, Estimated: >60 mL/min (ref >60)
Glucose, Bld: 98 mg/dL (ref 70–99)
Potassium: 4.4 mmol/L (ref 3.5–5.1)
Sodium: 144 mmol/L (ref 135–145)

## 2021-06-09 LAB — PHOSPHORUS: Phosphorus: 3.1 mg/dL (ref 2.5–4.6)

## 2021-06-09 LAB — CBC
HCT: 32.6 % — ABNORMAL LOW (ref 39.0–52.0)
Hemoglobin: 10.6 g/dL — ABNORMAL LOW (ref 13.0–17.0)
MCH: 27.6 pg (ref 26.0–34.0)
MCHC: 32.5 g/dL (ref 30.0–36.0)
MCV: 84.9 fL (ref 80.0–100.0)
Platelets: 597 10*3/uL — ABNORMAL HIGH (ref 150–400)
RBC: 3.84 MIL/uL — ABNORMAL LOW (ref 4.22–5.81)
RDW: 16.5 % — ABNORMAL HIGH (ref 11.5–15.5)
WBC: 17.9 10*3/uL — ABNORMAL HIGH (ref 4.0–10.5)
nRBC: 0 % (ref 0.0–0.2)

## 2021-06-09 LAB — GLUCOSE, CAPILLARY
Glucose-Capillary: 95 mg/dL (ref 70–99)
Glucose-Capillary: 167 mg/dL — ABNORMAL HIGH (ref 70–99)

## 2021-06-09 LAB — MAGNESIUM: Magnesium: 1.8 mg/dL (ref 1.7–2.4)

## 2021-06-09 MED ORDER — ENSURE ENLIVE PO LIQD: 237.0000 mL | Freq: Two times a day (BID) | ORAL | 0 refills | Status: AC

## 2021-06-09 NOTE — Progress Notes (Deleted)
Synopsis: Referred in September 2022 for lung mass, pleural effusion by Robyne Peers, MD  Subjective:   PATIENT ID: Kevin Hobbs GENDER: male DOB: 04/04/1941, MRN: [id number]  No chief complaint on file.   This is a 80 year old gentleman, past medical history of DVT, diabetes, hypertension.  Recent hospitalization. Patient was discharged on 05/27/2021.  Unfortunately readmitted to the hospital on 06/04/2021.   Past Medical History:  Diagnosis Date   Acute DVT (deep venous thrombosis) (HCC)    LLE; Aug '22   Acute pulmonary embolism [facility])    Aug '22   Diabetes mellitus without complication ([place])    Hypertension    Pleural effusion    right-sided     No family history on file.   Past Surgical History:  Procedure Laterality Date   HERNIA REPAIR      Social History   Socioeconomic History   Marital status: Married    Spouse name: Not on file   Number of children: Not on file   Years of education: Not on file   Highest education level: Not on file  Occupational History   Not on file  Tobacco Use   Smoking status: Former    Types: Cigarettes   Smokeless tobacco: Never  Substance and Sexual Activity   Alcohol use: Not Currently   Drug use: Never   Sexual activity: Not on file  Other Topics Concern   Not on file  Social History Narrative   Not on file   Social Determinants of Health   Financial Resource Strain: Not on file  Food Insecurity: Not on file  Transportation Needs: Not on file  Physical Activity: Not on file  Stress: Not on file  Social Connections: Not on file  Intimate Partner Violence: Not on file     No Known Allergies   Facility-Administered Medications Prior to Visit  Medication Dose Route Frequency Provider Last Rate Last Admin   acetaminophen (TYLENOL) tablet 650 mg  650 mg Oral Q6H PRN Howerter, Justin B, DO   650 mg at 06/08/21 7793   Or   acetaminophen (TYLENOL) suppository 650 mg  650 mg Rectal Q6H PRN Howerter, Justin B, DO        albuterol (PROVENTIL) (2.5 MG/3ML) 0.083% nebulizer solution 2.5 mg  2.5 mg Inhalation Q4H PRN Howerter, Justin B, DO   2.5 mg at 06/08/21 1535   albuterol (PROVENTIL) (2.5 MG/3ML) 0.083% nebulizer solution 2.5 mg  2.5 mg Nebulization TID Shawna Clamp, MD       amLODipine (NORVASC) tablet 10 mg  10 mg Oral Daily Shawna Clamp, MD   10 mg at 06/08/21 1656   apixaban (ELIQUIS) tablet 5 mg  5 mg Oral BID Howerter, Justin B, DO   5 mg at 06/08/21 2010   azithromycin (ZITHROMAX) tablet 500 mg  500 mg Oral Daily Eudelia Bunch, RPH   500 mg at 06/08/21 1032   Chlorhexidine Gluconate Cloth 2 % PADS 6 each  6 each Topical Daily Howerter, Justin B, DO   6 each at 06/06/21 0954   dextromethorphan-guaiFENesin (MUCINEX DM) 30-600 MG per 12 hr tablet 1 tablet  1 tablet Oral BID PRN Shawna Clamp, MD   1 tablet at 06/08/21 1535   feeding supplement (ENSURE ENLIVE / ENSURE PLUS) liquid 237 mL  237 mL Oral BID BM Howerter, Justin B, DO   237 mL at 06/08/21 1346   guaiFENesin-dextromethorphan (ROBITUSSIN DM) 100-10 MG/5ML syrup 5 mL  5 mL Oral Q4H PRN Dwyane Dee,  Pardeep, MD   5 mL at 06/08/21 2009   insulin aspart (novoLOG) injection 0-9 Units  0-9 Units Subcutaneous TID WC Howerter, Justin B, DO   3 Units at 06/08/21 1655   losartan (COZAAR) tablet 100 mg  100 mg Oral Daily Shawna Clamp, MD   100 mg at 06/08/21 1655   MEDLINE mouth rinse  15 mL Mouth Rinse BID Howerter, Justin B, DO   15 mL at 06/08/21 2009   mometasone-formoterol (DULERA) 100-5 MCG/ACT inhaler 2 puff  2 puff Inhalation BID Howerter, Justin B, DO   2 puff at 06/08/21 1954   multivitamin with minerals tablet 1 tablet  1 tablet Oral Daily Howerter, Justin B, DO   1 tablet at 06/08/21 1032   pantoprazole (PROTONIX) EC tablet 40 mg  40 mg Oral Daily Howerter, Justin B, DO   40 mg at 06/08/21 1034   predniSONE (DELTASONE) tablet 40 mg  40 mg Oral Q breakfast Hunsucker, Bonna Gains, MD   40 mg at 06/08/21 0824   simvastatin (ZOCOR) tablet 10 mg   10 mg Oral Daily Howerter, Justin B, DO   10 mg at 06/08/21 1034   umeclidinium bromide (INCRUSE ELLIPTA) 62.5 MCG/INH 1 puff  1 puff Inhalation Daily Howerter, Justin B, DO   1 puff at 06/08/21 0732   Outpatient Medications Prior to Visit  Medication Sig Dispense Refill   acetaminophen (TYLENOL) 325 MG tablet Take 2 tablets (650 mg total) by mouth every 6 (six) hours as needed for mild pain (or Fever >/= 101). 30 tablet 0   amLODipine (NORVASC) 10 MG tablet Take 10 mg by mouth daily.     amoxicillin (AMOXIL) 500 MG tablet Take 1,000 mg by mouth 2 (two) times daily.     apixaban (ELIQUIS) 5 MG TABS tablet Take 2 tablets (10 mg total) by mouth 2 (two) times daily for 7 days, THEN 1 tablet (5 mg total) 2 (two) times daily for 28 days. 84 tablet 0   budesonide-formoterol (SYMBICORT) 80-4.5 MCG/ACT inhaler Inhale 2 puffs into the lungs in the morning and at bedtime. 1 each 12   clarithromycin (BIAXIN) 500 MG tablet Take 500 mg by mouth 2 (two) times daily. Start date : 05/24/21     escitalopram (LEXAPRO) 5 MG tablet Take 5 mg by mouth daily.     losartan (COZAAR) 100 MG tablet Take 100 mg by mouth daily.     metFORMIN (GLUCOPHAGE-XR) 500 MG 24 hr tablet Take 1,000 mg by mouth daily.     ondansetron (ZOFRAN) 4 MG tablet Take 1 tablet (4 mg total) by mouth every 6 (six) hours as needed for nausea. 20 tablet 0   pantoprazole (PROTONIX) 40 MG tablet Take 40 mg by mouth daily.     simvastatin (ZOCOR) 10 MG tablet Take 10 mg by mouth daily.     sodium chloride (OCEAN) 0.65 % nasal spray Place 1 spray into the nose at bedtime.     tiotropium (SPIRIVA HANDIHALER) 18 MCG inhalation capsule Place 1 capsule (18 mcg total) into inhaler and inhale daily. 30 capsule 12    ROS   Objective:  Physical Exam   There were no vitals filed for this visit.   on *** LPM *** RA BMI Readings from Last 3 Encounters:  06/09/21 36.77 kg/m  05/25/21 41.04 kg/m   Wt Readings from Last 3 Encounters:  06/09/21 234  lb 12.6 oz (106.5 kg)  05/25/21 262 lb (118.8 kg)     CBC  Component Value Date/Time   WBC 17.9 (H) 06/09/2021 0438   RBC 3.84 (L) 06/09/2021 0438   HGB 10.6 (L) 06/09/2021 0438   HCT 32.6 (L) 06/09/2021 0438   PLT 597 (H) 06/09/2021 0438   MCV 84.9 06/09/2021 0438   MCH 27.6 06/09/2021 0438   MCHC 32.5 06/09/2021 0438   RDW 16.5 (H) 06/09/2021 0438   LYMPHSABS 0.7 06/05/2021 0430   MONOABS 0.2 06/05/2021 0430   EOSABS 0.0 06/05/2021 0430   BASOSABS 0.1 06/05/2021 0430    ***  Chest Imaging: ***  Pulmonary Functions Testing Results: No flowsheet data found.  FeNO: ***  Pathology: ***  Echocardiogram: ***  Heart Catheterization: ***    Assessment & Plan:   No diagnosis found.  Discussion: ***  No current facility-administered medications for this visit. No current outpatient medications on file.  Facility-Administered Medications Ordered in Other Visits:    acetaminophen (TYLENOL) tablet 650 mg, 650 mg, Oral, Q6H PRN, 650 mg at 06/08/21 0517 **OR** acetaminophen (TYLENOL) suppository 650 mg, 650 mg, Rectal, Q6H PRN, Howerter, Justin B, DO   albuterol (PROVENTIL) (2.5 MG/3ML) 0.083% nebulizer solution 2.5 mg, 2.5 mg, Inhalation, Q4H PRN, Howerter, Justin B, DO, 2.5 mg at 06/08/21 1535   albuterol (PROVENTIL) (2.5 MG/3ML) 0.083% nebulizer solution 2.5 mg, 2.5 mg, Nebulization, TID, Shawna Clamp, MD   amLODipine (NORVASC) tablet 10 mg, 10 mg, Oral, Daily, Shawna Clamp, MD, 10 mg at 06/08/21 1656   apixaban (ELIQUIS) tablet 5 mg, 5 mg, Oral, BID, Howerter, Justin B, DO, 5 mg at 06/08/21 2010   azithromycin (ZITHROMAX) tablet 500 mg, 500 mg, Oral, Daily, Bell, Michelle T, RPH, 500 mg at 06/08/21 1032   Chlorhexidine Gluconate Cloth 2 % PADS 6 each, 6 each, Topical, Daily, Howerter, Justin B, DO, 6 each at 06/06/21 0954   dextromethorphan-guaiFENesin (MUCINEX DM) 30-600 MG per 12 hr tablet 1 tablet, 1 tablet, Oral, BID PRN, Shawna Clamp, MD, 1 tablet at  06/08/21 1535   feeding supplement (ENSURE ENLIVE / ENSURE PLUS) liquid 237 mL, 237 mL, Oral, BID BM, Howerter, Justin B, DO, 237 mL at 06/08/21 1346   guaiFENesin-dextromethorphan (ROBITUSSIN DM) 100-10 MG/5ML syrup 5 mL, 5 mL, Oral, Q4H PRN, Shawna Clamp, MD, 5 mL at 06/08/21 2009   insulin aspart (novoLOG) injection 0-9 Units, 0-9 Units, Subcutaneous, TID WC, Howerter, Justin B, DO, 3 Units at 06/08/21 1655   losartan (COZAAR) tablet 100 mg, 100 mg, Oral, Daily, Shawna Clamp, MD, 100 mg at 06/08/21 1655   MEDLINE mouth rinse, 15 mL, Mouth Rinse, BID, Howerter, Justin B, DO, 15 mL at 06/08/21 2009   mometasone-formoterol (DULERA) 100-5 MCG/ACT inhaler 2 puff, 2 puff, Inhalation, BID, Howerter, Justin B, DO, 2 puff at 06/08/21 1954   multivitamin with minerals tablet 1 tablet, 1 tablet, Oral, Daily, Howerter, Justin B, DO, 1 tablet at 06/08/21 1032   pantoprazole (PROTONIX) EC tablet 40 mg, 40 mg, Oral, Daily, Howerter, Justin B, DO, 40 mg at 06/08/21 1034   predniSONE (DELTASONE) tablet 40 mg, 40 mg, Oral, Q breakfast, Hunsucker, Bonna Gains, MD, 40 mg at 06/08/21 0824   simvastatin (ZOCOR) tablet 10 mg, 10 mg, Oral, Daily, Howerter, Justin B, DO, 10 mg at 06/08/21 1034   umeclidinium bromide (INCRUSE ELLIPTA) 62.5 MCG/INH 1 puff, 1 puff, Inhalation, Daily, Howerter, Justin B, DO, 1 puff at 06/08/21 0732  I spent *** minutes dedicated to the care of this patient on the date of this encounter to include pre-visit review of records, face-to-face time  with the patient discussing conditions above, post visit ordering of testing, clinical documentation with the electronic health record, making appropriate referrals as documented, and communicating necessary findings to members of the patients care team.   Garner Nash, [place] [facility] 06/09/2021 7:49 AM

## 2021-06-09 NOTE — TOC Transition Note (Signed)
Transition of Care [facility]) - CM/SW Discharge Note   Patient Details  Name: Kevin Hobbs MRN: [id number] Date of Birth: 07-23-1941  Transition of Care [facility]) CM/SW Contact:  Dessa Phi, RN Phone Number: 06/09/2021, 2:15 PM   Clinical Narrative: [facility] rep Ramond Marrow able to provide HHRN/PT-late Start of care-next week-agreeable. [employer] rep Thedore Mins to [place] hospital bed to home-patient agree to d/c home prior to bed arriving today. Already has home 02 has travel tank. [employer] rep Thedore Mins will work on Education officer, museum @ a later time w/family. Family will transport home on own. No further CM needs.     Final next level of care: [place] Barriers to Discharge: No Barriers Identified   Patient Goals and CMS Choice        Discharge Placement                       Discharge Plan and Services                DME Arranged: Hospital bed DME Agency: [employer]       HH Arranged: RN, PT [facility] Agency: [place] ([place]tha) Date [place]: 06/09/21 Time [place]: 1414 Representative spoke with at [place]: [place] ([place]) Interventions     Readmission Risk Interventions No flowsheet data found.

## 2021-06-09 NOTE — Evaluation (Addendum)
Clinical/Bedside Swallow Evaluation Patient Details  Name: Kevin Hobbs MRN: [id number] Date of Birth: Jan 24, 1941  Today's Date: 06/09/2021 Time: SLP Start Time (ACUTE ONLY): 1004 SLP Stop Time (ACUTE ONLY): 1050 SLP Time Calculation (min) (ACUTE ONLY): 46 min  Past Medical History:  Past Medical History:  Diagnosis Date   Acute DVT (deep venous thrombosis) (HCC)    LLE; Aug '22   Acute pulmonary embolism ([place])    Aug '22   Diabetes mellitus without complication ([place])    Hypertension    Pleural effusion    right-sided   Past Surgical History:  Past Surgical History:  Procedure Laterality Date   HERNIA REPAIR     HPI:  80 yo male adm to [facility] with dyspnea, respiratory failure.  Pt has been newly diagnosed with advanced NSCLC, has h/o GERD.  Swallow eval ordered due to pt coughing/choking with food/meat evening of 9/6.  Pt is on a PPI and denies symptomatic reflux. Family present and report pt with chronic belching post-swallow.   Assessment / Plan / Recommendation Clinical Impression  Mr Zarcone alert, cooperative with family present.  He reports xerostomia considerable and is observed to extend chin upward to swallow first bolus of liquid.  No focal CN deficits and voice/cough are strong. Pt easily passed 3 ounce Yale water challenge.  Noted to have repetitive movement of mouth/tongue without boluses contained - which family states pt conducts when dentures are not in place.   Given pt got "choked" on meatload, suspect this excessive lingual/labial/facial movement may have allowed premature spillage into airway.  Pt observed clinically feeding himself water, juice, 3 ounces applesauce and 4 graham crackers.  NO indication of aspiration but mild oral delays noted.  Pt noted to be slightyl delayed in his verbal responses to SLP, but was able to articulate his concerns with extra time.  No eructation noted with liquid intake nor does RN endorse occurence. Family stated pt only having  belching since hospital admit. . Educated family/pt throughly to compensations to mitigate episodic aspiration risk especially given newly diagnosed NSCLC with increased pna risk.  Also reviewed clinical indication of potential aspiration. Recommend soft/thin diet with general precautions.  After full review, pt and family endorse pt likely aspirated due to eating rapidly and not masticating well.   SLP Visit Diagnosis: Dysphagia, oropharyngeal phase (R13.12)    Aspiration Risk  Mild aspiration risk    Diet Recommendation Dysphagia 3 (Mech soft);Thin liquid   Liquid Administration via: Cup;Straw Medication Administration: Whole meds with puree (start intake with liquids) Supervision: Patient able to self feed Compensations: Slow rate;Small sips/bites (start intake with liquids, use puree to orally transit masticated solids as needed) Postural Changes: Seated upright at 90 degrees;Remain upright for at least 30 minutes after po intake    Other  Recommendations     Follow up Recommendations None      Frequency and Duration            Prognosis        Swallow Study   General HPI: 80 yo male adm to [facility] with dyspnea, respiratory failure.  Pt has been newly diagnosed with advanced NSCLC, has h/o GERD.  Swallow eval ordered due to pt coughing/choking with food/meat evening of 9/6.  Pt is on a PPI and denies symptomatic reflux. Family present and report pt with chronic belching post-swallow. Type of Study: Bedside Swallow Evaluation Diet Prior to this Study: Regular;Thin liquids Temperature Spikes Noted: No Respiratory Status: Nasal cannula History of Recent  Intubation: No Behavior/Cognition: Alert;Cooperative;Other (Comment) (mildly delayed responses) Oral Cavity Assessment: Dry Oral Care Completed by SLP: No Oral Cavity - Dentition: Other (Comment) (dentures at home) Vision: Functional for self-feeding Self-Feeding Abilities: Able to feed self Patient Positioning: Upright in  bed Baseline Vocal Quality: Normal Volitional Cough: Strong Volitional Swallow: Able to elicit    Oral/Motor/Sensory Function     Ice Chips Ice chips: Not tested   Thin Liquid Thin Liquid: Within functional limits Presentation: Cup;Self Fed;Straw    Nectar Thick Nectar Thick Liquid: Not tested   Honey Thick Honey Thick Liquid: Not tested   Puree Puree: Impaired Presentation: Self Fed Oral Phase Impairments: Reduced lingual movement/coordination Oral Phase Functional Implications: Prolonged oral transit   Solid     Solid: Impaired Oral Phase Impairments: Impaired mastication;Reduced lingual movement/coordination Oral Phase Functional Implications: Prolonged oral transit      Macario Golds 06/09/2021,12:57 PM   Kathleen Lime, MS [facility] SLP Acute Rehab Services Office [phone] Pager [phone]

## 2021-06-09 NOTE — Discharge Summary (Addendum)
Physician Discharge Summary  CORDEL DREWES HFW:[id number] DOB: 07-04-1941 DOA: 06/04/2021  PCP: Robyne Peers, MD  Admit date: 06/04/2021 Discharge date: 06/09/2021  Admitted From: home  Disposition:  home   Recommendations for Outpatient Follow-up and new medication changes:  Follow up with Dr Ruben Gottron in 7 to 10 days.  Follow up with Oncology as outpatient Patient will be transferred to [facility] for further CVA workup.    I spoke with patient's wife and daughter at the bedside, we talked in detail about patient's condition, plan of care and prognosis and all questions were addressed.   Home Health: yes   Equipment/Devices: hospital bed, home 02    Discharge Condition:stable  CODE STATUS: full  Diet recommendation:  regular / dysphagia 3 diet.   Brief/Interim Summary: Mr. 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   80 year old male past medical history for pulmonary embolism, acute lower extremity DVT, non-small cell lung cancer, and type 2 diabetes mellitus, who presented with recurrent dyspnea.  He had a recent hospitalization 8/23-8/25/2022 when he was diagnosed with pulmonary embolism, right pleural effusion and right middle lobe lung mass.  Underwent thoracentesis and 1.3 L of fluid was removed.  At home he had progressive recurrent dyspnea associated with cough and orthopnea.   Because of persistent and worsening symptoms he came back to the hospital.  On his initial physical examination he was afebrile, heart rate 78, blood pressure 117/58, respiratory rate 22-28, oxygen saturation 90% on 2 L of supplemental oxygen per nasal cannula.  His lungs had decreased breath sounds at the right side, no wheezing or rhonchi, heart S1-S2, present, rhythmic, soft abdomen, no lower extremity edema.  Sodium 136, potassium 5.3, chloride  107, bicarb 20, glucose 99, BUN 61, creatinine 1.49, AST 164, ALT 91, white count 12.1, hemoglobin 9.5, hematocrit 39.6, platelets 602 SARS COVID-19 negative.  Chest radiograph with right pleural effusion, right lower/ middle  lobe opacity.  EKG 88 bpm, normal axis, normal intervals, Q-wave V1, normal sinus rhythm, no significant ST segment or T wave changes.  Patient was admitted to the medical ward, he was placed on supplemental oxygen per nasal cannula. Underwent thoracentesis 22 cc of amber fluid was obtained. Received antibiotic therapy for possible post-obstructive pneumonia.   Acute on chronic hypoxemic respiratory failure/postobstructive pneumonia.  Swallow dysfunction. COPD exacerbation.  Patient was placed on supplemental oxygen per nasal cannula.  His dyspnea has progressively improved.  Patient received 5 days of azithromycin and prednisone along with albuterol as needed.   Speech therapy recommended a dysphagia 3 diet.  His oxymetry at discharge is 95% on 5 L/min per [place]   2.  Right middle lobe non-small cell carcinoma, malignant right pleural effusion  Right chest ultrasonography with minimal free fluid, 20 cc of amber fluid was removed. Patient will follow-up as an outpatient with oncology.  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  No evidence of intracranial metastatic disease. Few scattered chronic microhemorrhages involving the soft tentorial brain.  Echocardiogram from 08/22 had no intracavitary thrombus, continue anticoagulation with apixaban.  Plan to transfer to [facility] for further CVA workup.   Reactive leukocytosis due to systemic steroids.  Discharge white cell count 17.9.  Patient  continued to be very weak and deconditioned, continue home health services with  home physical therapy, home occupational therapy. He will need a hospital bed because of his respiratory failure.   3.  Acute pulmonary embolism.  Continue anticoagulation with apixaban.  4.  Acute kidney injury, hyperkalemia.  With supportive medical therapy electrolytes were corrected, kidney function improved. At discharge sodium 144, potassium 4.4, chloride 113, bicarb 22, glucose 98, BUN 28, creatinine 0.99.  5.  Type 2 diabetes mellitus/ dyslipidemia.  Patient was placed on insulin sliding scale for glucose coverage monitoring. At discharge resume metformin.   Continue satin therapy with simvastatin.   6.  Hypertension.  Continue blood pressure control with losartan and amlodipine.  7.  GERD.  Continue with proton pump inhibitors.  8. Depression. Continue with escitalopram.   Discharge Diagnoses:  Principal Problem:   Acute on chronic respiratory failure with hypoxia (HCC) Active Problems:   Pulmonary embolism (HCC)   Pleural effusion   SOB (shortness of breath)   Diabetes mellitus without complication (HCC)   Essential hypertension   Hyperkalemia   GERD (gastroesophageal reflux disease)    Discharge Instructions   Allergies as of 06/09/2021   No Known Allergies      Medication List     STOP taking these medications    amoxicillin 500 MG tablet Commonly known as: AMOXIL   clarithromycin 500 MG tablet Commonly known as: BIAXIN       TAKE these medications    acetaminophen 325 MG tablet Commonly known as: TYLENOL Take 2 tablets (650 mg total) by mouth every 6 (six) hours as needed for mild pain (or Fever >/= 101).   amLODipine 10 MG tablet Commonly known as: NORVASC Take 10 mg by mouth daily.   apixaban 5 MG Tabs tablet Commonly known as: ELIQUIS Take 2 tablets (10 mg total) by mouth 2 (two) times daily for 7 days, THEN 1 tablet (5 mg total) 2 (two) times daily for 28 days. Start taking on: May 27, 2021   budesonide-formoterol 80-4.5 MCG/ACT  inhaler Commonly known as: SYMBICORT Inhale 2 puffs into the lungs in the morning and at bedtime.   escitalopram 5 MG tablet Commonly known as: LEXAPRO Take 5 mg by mouth daily.   feeding supplement Liqd Take 237 mLs by mouth 2 (two) times daily between meals.   losartan 100 MG tablet Commonly known as: COZAAR Take 100 mg by mouth daily.   metFORMIN 500 MG 24 hr tablet Commonly known as: GLUCOPHAGE-XR Take 1,000 mg by mouth daily.   ondansetron 4 MG tablet Commonly known as: ZOFRAN Take 1 tablet (4 mg total) by mouth every 6 (six) hours as needed for nausea.   pantoprazole 40 MG tablet Commonly known as: PROTONIX Take 40 mg by mouth daily.   simvastatin 10 MG tablet Commonly known as: ZOCOR Take 10 mg by mouth daily.   sodium chloride 0.65 % nasal spray Commonly known as: OCEAN Place 1 spray into the nose at bedtime.   Spiriva HandiHaler 18 MCG inhalation capsule Generic drug: tiotropium Place 1 capsule (18 mcg total) into inhaler and inhale daily.               Durable Medical Equipment  (From admission, onward)           Start     Ordered   06/09/21 1316  For home use only DME Hospital bed  Once       Question Answer Comment  Length of Need 6 Months  The above medical condition requires: Patient requires the ability to reposition frequently   Head must be elevated greater than: 30 degrees   Bed type Semi-electric      06/09/21 1315            No Known Allergies  Consultations: Pulmonary  Oncology    Procedures/Studies: DG Chest 1 View  Result Date: 06/05/2021 CLINICAL DATA:  Status post thoracentesis. EXAM: CHEST  1 VIEW COMPARISON:  Chest radiograph dated 06/04/2021. FINDINGS: The heart is obscured. A moderate right pleural effusion with associated atelectasis/consolidation appears unchanged. There is no right pneumothorax. The left lung is clear without pleural effusion or pneumothorax. IMPRESSION: No significant change in a moderate  right pleural effusion with associated atelectasis/consolidation. No pneumothorax. Electronically Signed   By: Zerita Boers M.D.   On: 06/05/2021 12:52   DG Chest 1 View  Result Date: 05/26/2021 CLINICAL DATA:  Status post right thoracentesis. EXAM: CHEST  1 VIEW COMPARISON:  One-view chest x-ray 05/25/2021 FINDINGS: Heart is enlarged. Right pleural effusion is significantly decreased. No pneumothorax is present. Left lung is clear. Pulmonary vascular congestion noted. IMPRESSION: 1. Right pleural effusion without evidence for complication. 2. Pulmonary vascular congestion. Electronically Signed   By: San Morelle M.D.   On: 05/26/2021 15:36   DG Chest 2 View  Result Date: 06/04/2021 CLINICAL DATA:  Shortness of breath.  History of lung cancer. EXAM: CHEST - 2 VIEW COMPARISON:  Radiograph 05/27/2021.  PET CT yesterday. FINDINGS: Again seen elevation of right hemidiaphragm. Right pleural effusion and right pulmonary mass seen on PET CT yesterday. Stable heart size and mediastinal contours. Peribronchial thickening. No pneumothorax. No significant left pleural effusion. No acute osseous abnormalities are seen. Known lytic lesions on prior PET are not seen by radiograph. IMPRESSION: 1. Unchanged elevation of right hemidiaphragm. Patient with known right pulmonary mass and pleural effusion, evaluated on PET yesterday. 2. Bronchial thickening. Electronically Signed   By: Keith Rake M.D.   On: 06/04/2021 16:31   CT Angio Chest PE W and/or Wo Contrast  Result Date: 05/25/2021 CLINICAL DATA:  Shortness of breath EXAM: CT ANGIOGRAPHY CHEST WITH CONTRAST TECHNIQUE: Multidetector CT imaging of the chest was performed using the standard protocol during bolus administration of intravenous contrast. Multiplanar CT image reconstructions and MIPs were obtained to evaluate the vascular anatomy. CONTRAST:  79mL OMNIPAQUE IOHEXOL 350 MG/ML SOLN COMPARISON:  None. FINDINGS: Cardiovascular: Examination for  pulmonary embolism is limited by marginal contrast bolus, main pulmonary artery = 141 HU. Within this limitation, positive examination for pulmonary embolism, with segmental to subsegmental embolus present in the right upper lobe (series 4, image 37). Normal heart size. Three-vessel coronary artery calcifications. No pericardial effusion. Aortic atherosclerosis. Enlargement of the main pulmonary artery measuring up to 3.6 cm in caliber. RV LV ratio is preserved, approximately 0.6. Mediastinum/Nodes: Markedly enlarged subcarinal nodes measuring at least 6.1 x 2.6 cm (series 4, image 52) thyroid gland, trachea, and esophagus demonstrate no significant findings. Lungs/Pleura: Moderate right pleural effusion associated atelectasis or consolidation. There is a large mass centered in the right middle lobe, which occludes the middle lobe segmental bronchi, although difficult to distinguish from airspace consolidation and adjacent atelectasis, this measures approximately 7.9 x 7.7 cm (series 4, image 55). Upper Abdomen: No acute abnormality. Gallstones and or sludge in the dependent gallbladder. Musculoskeletal: No chest wall abnormality. No acute or significant osseous findings. Review of the MIP images confirms the above findings. IMPRESSION: 1. Positive examination for pulmonary embolism, with segmental to  subsegmental embolus present in the right upper lobe. 2. Enlargement of the main pulmonary artery, concerning for pulmonary hypertension. No elevation of the RV LV ratio. 3. There is a large mass centered in the right middle lobe, which occludes the middle lobe segmental bronchi, although difficult to distinguish from airspace consolidation and adjacent atelectasis, this measures approximately 7.9 x 7.7 cm. 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 4. Markedly enlarged subcarinal lymph nodes, concerning for nodal metastatic disease. 5. Moderate right pleural effusion and associated atelectasis or  consolidation, presumably malignant, however without direct evidence of pleural metastatic disease. 6. Coronary artery disease. These results were called by telephone at the time of interpretation on 05/25/2021 at 11:29 am to Dr. Lennice Sites , who verbally acknowledged these results. Aortic Atherosclerosis (ICD10-I70.0). Electronically Signed   By: Eddie Candle M.D.   On: 05/25/2021 11:30   MR BRAIN W WO CONTRAST  Result Date: 06/09/2021 CLINICAL DATA:  Initial evaluation for non-small cell lung cancer, staging. EXAM: MRI HEAD WITHOUT AND WITH CONTRAST TECHNIQUE: Multiplanar, multiecho pulse sequences of the brain and surrounding structures were obtained without and with intravenous contrast. CONTRAST:  22mL GADAVIST GADOBUTROL 1 MMOL/ML IV SOLN COMPARISON:  None available. FINDINGS: Brain: Examination degraded by motion artifact. Diffuse prominence of the CSF containing spaces compatible with generalized age-related cerebral atrophy. Patchy and confluent T2/FLAIR hyperintensity involving the periventricular and deep white matter both cerebral hemispheres as well as the pons, most consistent with chronic small vessel ischemic disease, fairly advanced in nature. Superimposed remote lacunar infarct noted involving the left lentiform nucleus/external capsule. 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 Largest of these foci seen at the right frontal centrum semi ovale and measures up to 1 cm (series 5, image 79). Minimal scattered cortical involvement noted at the parieto-occipital regions. No associated mass effect or hemorrhage. Gray-white matter differentiation otherwise maintained. No encephalomalacia to suggest chronic cortical infarction elsewhere within the brain. No acute intracranial hemorrhage. Few scattered  chronic micro hemorrhages noted involving both cerebral hemispheres, likely related to poorly controlled hypertension. No mass lesion or abnormal enhancement. No evidence for intracranial metastatic disease. No mass effect or midline shift. No hydrocephalus or extra-axial fluid collection. Pituitary gland suprasellar region within normal limits. Midline structures intact. Vascular: Major intracranial vascular flow voids are maintained. Skull and upper cervical spine: Craniocervical junction within normal limits. Bone marrow signal intensity grossly within normal limits. No visible focal marrow replacing lesion. No scalp soft tissue abnormality. Sinuses/Orbits: Patient status post bilateral ocular lens replacement. Globes and orbital soft tissues demonstrate no acute finding. Mild scattered mucosal thickening noted about the ethmoidal air cells. Paranasal sinuses are otherwise clear. No significant mastoid effusion. Inner ear structures grossly normal. Other: None. IMPRESSION: 1. 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 No associated hemorrhage or mass effect. 2. No evidence for intracranial metastatic disease. 3. Underlying age-related cerebral atrophy with advanced chronic microvascular ischemic disease. 4. Few scattered chronic micro hemorrhages involving the supratentorial brain, likely related to chronic poorly controlled hypertension. Electronically Signed   By: Jeannine Boga M.D.   On: 06/09/2021 01:45   NM PET Image Initial (PI) Skull Base To Thigh  Result Date: 06/04/2021 CLINICAL DATA:  Initial treatment strategy for lung nodule. EXAM: NUCLEAR  MEDICINE PET SKULL BASE TO THIGH TECHNIQUE: 12.9 mCi F-18 FDG was injected intravenously. Full-ring PET imaging was performed from the skull base to thigh after the radiotracer. CT  data was obtained and used for attenuation correction and anatomic localization. Fasting blood glucose: 115 mg/dl COMPARISON:  Chest CTA dated May 25, 2021 FINDINGS: Mediastinal blood pool activity: SUV max 3.1 Liver activity: SUV max 3.8. NECK: Hypermetabolic right supraclavicular lymph node measuring 7 mm in short axis on image number 39 with an SUV max of 6.8. Incidental CT findings: none CHEST: Hypermetabolic mass centered in the right upper lobe, difficult to measure, but approximately 9.4 x 9.4 cm with an SUV max of 23. Hypermetabolic right paratracheal, subcarinal and bilateral hilar lymph nodes. Reference subcarinal lymph node measures approximately 2.0 cm in short axis with an SUV max of 11.2. Reference right hilar lymph node measures 1.5 cm in short axis on image 69 with an SUV max of 15.2. Reference left hilar lymph node measures approximately 1.4 cm in short axis with an SUV max of 10.4. Small loculated right pleural effusion with pleural FDG uptake, SUV max of 6.3. Incidental CT findings: No pericardial effusion. Calcifications of the LAD and circumflex. Atherosclerotic disease of the thoracic aorta. ABDOMEN/PELVIS: No abnormal hypermetabolic activity within the liver, pancreas, adrenal glands, or spleen. No hypermetabolic lymph nodes in the abdomen or pelvis. Incidental CT findings: Cholelithiasis and sigmoid diverticula. SKELETON: Scattered foci of osseous hypermetabolic activity which correlate with subtle lytic lesions in the right iliac bone, T12 vertebral body, lateral sixth rib, and left scapula. Reference right iliac lesion demonstrates an SUV max of 8.2. Incidental CT findings: none IMPRESSION: Large hypermetabolic mass centered in the right middle lobe which occludes the right middle lobe bronchi. Small loculated pleural effusion with pleural FDG uptake, compatible with malignant effusion. Hypermetabolic right supraclavicular, right paratracheal, subcarinal, and bilateral hilar lymph nodes.  Scattered foci of osseous hypermetabolic activity which correlate with subtle lytic lesions, compatible with osseous metastatic disease. Aortic Atherosclerosis (ICD10-I70.0). Electronically Signed   By: Yetta Glassman M.D.   On: 06/04/2021 16:20   DG CHEST PORT 1 VIEW  Result Date: 06/07/2021 CLINICAL DATA:  Right lower lung mass with component of pleural effusion. EXAM: PORTABLE CHEST 1 VIEW COMPARISON:  Chest x-rays on 06/04/2021 and 06/05/2021. PET scan on 06/03/2021. FINDINGS: Stable heart size and aortic tortuosity. Extensive mass-like density and consolidation of the right lower lung noted which has been previously demonstrated to be a large mass at the right lung base. Component of associated right pleural fluid may be present. However, recent ultrasound did not demonstrate a large pleural effusion. No pneumothorax or pulmonary edema. IMPRESSION: Mass-like opacity and consolidation of the right lower lung. Potential associated right pleural fluid. Electronically Signed   By: Aletta Edouard M.D.   On: 06/07/2021 08:43   DG CHEST PORT 1 VIEW  Result Date: 05/27/2021 CLINICAL DATA:  Short of breath.  Right thoracentesis yesterday EXAM: PORTABLE CHEST 1 VIEW COMPARISON:  05/26/2021 FINDINGS: Persistent density in the right lung base may represent neoplasm based on CT. Pneumonia possible. Small right effusion unchanged. No pneumothorax Left lung remains clear. IMPRESSION: Persistent airspace density in the right lung base may represent tumor or pneumonia. No pneumothorax. Electronically Signed   By: Franchot Gallo M.D.   On: 05/27/2021 08:19   DG Chest Portable 1 View  Result Date: 05/25/2021 CLINICAL DATA:  Breath for 1 week, hypertension, RIGHT pleural effusion by abdominal ultrasound EXAM: PORTABLE CHEST 1 VIEW COMPARISON:  Portable exam 0920 hours without priors for comparison FINDINGS: Enlargement of cardiac silhouette with pulmonary vascular congestion. Atherosclerotic calcification aorta.  RIGHT pleural effusion and basilar atelectasis versus consolidation. Minimal atelectasis at LEFT base. Upper lungs clear. No pneumothorax. Bones demineralized. IMPRESSION: Enlargement of cardiac silhouette with pulmonary vascular congestion. RIGHT pleural effusion with basilar atelectasis and/or consolidation. Minimal LEFT basilar atelectasis. Electronically Signed   By: Lavonia Dana M.D.   On: 05/25/2021 10:03   ECHOCARDIOGRAM COMPLETE  Result Date: 05/26/2021    ECHOCARDIOGRAM REPORT   Patient Name:   TALVIN CHRISTIANSON Date of Exam: 05/26/2021 Medical Rec #:  [id number]       Height:       67.0 in Accession #:    [id number]      Weight:       262.0 lb Date of Birth:  24-Jun-1941       BSA:          2.268 m Patient Age:    48 years        BP:           144/80 mmHg Patient Gender: M               HR:           88 bpm. Exam Location:  Inpatient Procedure: 2D Echo, Cardiac Doppler and Color Doppler Indications:    Pulmonary embolus  History:        Patient has no prior history of Echocardiogram examinations.                 Signs/Symptoms:Pulmonary embolus; Risk Factors:Diabetes,                 Hypertension, Former Smoker and Obesity.  Sonographer:    Dustin Flock RDCS Referring Phys: [place]  1. Left ventricular ejection fraction, by estimation, is 60 to 65%. The left ventricle has normal function. Left ventricular endocardial border not optimally defined to evaluate regional wall motion. The left ventricular internal cavity size was mildly to moderately dilated. Left ventricular diastolic parameters are indeterminate.  2. Right ventricular systolic function is normal. The right ventricular size is normal.  3. The mitral valve is normal in structure. No evidence of mitral valve regurgitation.  4. The aortic valve is grossly normal. Aortic valve regurgitation is not visualized. FINDINGS  Left Ventricle: Left ventricular ejection fraction, by estimation, is 60 to 65%. The left ventricle  has normal function. Left ventricular endocardial border not optimally defined to evaluate regional wall motion. The left ventricular internal cavity size was mildly to moderately dilated. There is no left ventricular hypertrophy. Left ventricular diastolic parameters are indeterminate. Right Ventricle: The right ventricular size is normal. Right vetricular wall thickness was not well visualized. Right ventricular systolic function is normal. Left Atrium: Left atrial size was normal in size. Right Atrium: Right atrial size was normal in size. Pericardium: There is no evidence of pericardial effusion. Mitral Valve: The mitral valve is normal in structure. No evidence of mitral valve regurgitation. Tricuspid Valve: The tricuspid valve is grossly normal. Tricuspid valve regurgitation is mild. Aortic Valve: The aortic valve is grossly normal. Aortic valve regurgitation is not visualized. Pulmonic Valve: The pulmonic valve was grossly normal. Pulmonic valve regurgitation is not visualized. Aorta: The aortic root and ascending aorta are structurally normal, with no evidence of dilitation. IAS/Shunts: The atrial septum is grossly normal.  LEFT VENTRICLE PLAX 2D LVIDd:         5.10 cm  Diastology LVIDs:         3.20 cm      LV e' medial:    6.96 cm/s LV PW:         1.20 cm      LV E/e' medial:  9.4 LV IVS:        1.00 cm      LV e' lateral:   6.09 cm/s LVOT diam:     2.40 cm      LV E/e' lateral: 10.7 LV SV:         79 LV SV Index:   35 LVOT Area:     4.52 cm  LV Volumes (MOD) LV vol d, MOD A4C: 157.0 ml LV vol s, MOD A4C: 66.8 ml LV SV MOD A4C:     157.0 ml RIGHT VENTRICLE RV Basal diam:  2.50 cm RV S prime:     16.80 cm/s TAPSE (M-mode): 2.4 cm LEFT ATRIUM             Index       RIGHT ATRIUM           Index LA diam:        3.10 cm 1.37 cm/m  RA Area:     13.80 cm LA Vol (A2C):   65.9 ml 29.05 ml/m RA Volume:   36.60 ml  16.14 ml/m LA Vol (A4C):   44.3 ml 19.53 ml/m LA Biplane Vol: 54.5 ml 24.03 ml/m  AORTIC  VALVE LVOT Vmax:   110.00 cm/s LVOT Vmean:  64.400 cm/s LVOT VTI:    0.174 m  AORTA Ao Root diam: 2.90 cm MITRAL VALVE MV Area (PHT): 3.33 cm    SHUNTS MV Decel Time: 228 msec    Systemic VTI:  0.17 m MV E velocity: 65.10 cm/s  Systemic Diam: 2.40 cm MV A velocity: 78.40 cm/s MV E/A ratio:  0.83 Mertie Moores MD Electronically signed by Mertie Moores MD Signature Date/Time: 05/26/2021/1:17:37 PM    Final    VAS [place] LOWER EXTREMITY VENOUS (DVT)  Result Date: 05/27/2021  Lower Venous DVT Study Patient Name:  FYNN VANBLARCOM  Date of Exam:   05/27/2021 Medical Rec #: [id number]        Accession #:    [id number] Date of Birth: [birth year]        Patient Gender: M Patient Age:   80 years Exam Location:  [facility] Procedure:      VAS [place] LOWER EXTREMITY VENOUS (DVT) Referring Phys: Brand Males --------------------------------------------------------------------------------  Indications: Pulmonary embolism. Other Indications: Incrasing shortness of breath x1 week. Comparison Study: No previous exams Performing Technologist: Jody Hill RVT, RDMS  Examination Guidelines: A complete evaluation includes B-mode imaging, spectral Doppler, color Doppler, and power Doppler as needed of all accessible portions of each vessel. Bilateral testing is considered an integral part of a complete examination. Limited examinations for reoccurring indications may be performed as noted. The reflux portion of the exam is performed with the patient in reverse Trendelenburg.  +---------+---------------+---------+-----------+----------+--------------+ RIGHT    CompressibilityPhasicitySpontaneityPropertiesThrombus Aging +---------+---------------+---------+-----------+----------+--------------+ CFV      Full           Yes      Yes                                 +---------+---------------+---------+-----------+----------+--------------+ SFJ      Full                                                         +---------+---------------+---------+-----------+----------+--------------+  FV Prox  Full           Yes      Yes                                 +---------+---------------+---------+-----------+----------+--------------+ FV Mid   Full           Yes      Yes                                 +---------+---------------+---------+-----------+----------+--------------+ FV DistalFull           Yes      Yes                                 +---------+---------------+---------+-----------+----------+--------------+ PFV      Full                                                        +---------+---------------+---------+-----------+----------+--------------+ POP      Full           Yes      Yes                                 +---------+---------------+---------+-----------+----------+--------------+ PTV      Full                                                        +---------+---------------+---------+-----------+----------+--------------+ PERO     Full                                                        +---------+---------------+---------+-----------+----------+--------------+   +---------+---------------+---------+-----------+----------+--------------+ LEFT     CompressibilityPhasicitySpontaneityPropertiesThrombus Aging +---------+---------------+---------+-----------+----------+--------------+ CFV      Full           Yes      Yes                                 +---------+---------------+---------+-----------+----------+--------------+ SFJ      Full                                                        +---------+---------------+---------+-----------+----------+--------------+ FV Prox  Full           Yes      Yes                                 +---------+---------------+---------+-----------+----------+--------------+ FV Mid   Full  Yes      Yes                                  +---------+---------------+---------+-----------+----------+--------------+ FV DistalFull           Yes      Yes                                 +---------+---------------+---------+-----------+----------+--------------+ PFV      Full                                                        +---------+---------------+---------+-----------+----------+--------------+ POP      Full           Yes      Yes                                 +---------+---------------+---------+-----------+----------+--------------+ PTV      Full                                                        +---------+---------------+---------+-----------+----------+--------------+ PERO     None           No       No                   Acute          +---------+---------------+---------+-----------+----------+--------------+     Summary: BILATERAL: - No evidence of superficial venous thrombosis in the lower extremities, bilaterally. -No evidence of popliteal cyst, bilaterally. RIGHT: - There is no evidence of deep vein thrombosis in the lower extremity.  LEFT: - Findings consistent with acute deep vein thrombosis involving the left peroneal veins.  *See table(s) above for measurements and observations. Electronically signed by Jamelle Haring on 05/27/2021 at 5:52:25 PM.    Final    US THORACENTESIS ASP PLEURAL SPACE W/IMG GUIDE  Result Date: 06/05/2021 INDICATION: Patient with history of lung cancer, pulmonary embolus, malignant right pleural effusion. Request received for diagnostic and therapeutic right thoracentesis. EXAM: ULTRASOUND GUIDED DIAGNOSTIC AND THERAPEUTIC RIGHT THORACENTESIS MEDICATIONS: 1% lidocaine to skin and subcutaneous tissue COMPLICATIONS: None immediate. PROCEDURE: An ultrasound guided thoracentesis was thoroughly discussed with the patient and questions answered. The benefits, risks, alternatives and complications were also discussed. The patient understands and wishes to proceed with the  procedure. Written consent was obtained. Ultrasound was performed to localize and mark an adequate pocket of fluid in the right chest. The area was then prepped and draped in the normal sterile fashion. 1% Lidocaine was used for local anesthesia. Under ultrasound guidance a 6 Fr Safe-T-Centesis catheter was introduced. Thoracentesis was performed. The catheter was removed and a dressing applied. FINDINGS: A total of approximately 20 cc of amber fluid was removed. Samples were sent to the laboratory as requested by the clinical team. Minimal free fluid was noted on today's ultrasound of right pleural space. Despite catheter manipulation only the above amount of fluid could be removed  today. IMPRESSION: Successful ultrasound guided diagnostic and therapeutic right thoracentesis yielding 20 cc of pleural fluid. Read by: Rowe Robert, PA-C Electronically Signed   By: Aletta Edouard M.D.   On: 06/05/2021 12:39   US THORACENTESIS ASP PLEURAL SPACE W/IMG GUIDE  Result Date: 05/26/2021 INDICATION: Patient with history of right lung mass, pulmonary embolus, dyspnea, right pleural effusion. Request received for diagnostic and therapeutic right thoracentesis. EXAM: ULTRASOUND GUIDED DIAGNOSTIC AND THERAPEUTIC RIGHT THORACENTESIS MEDICATIONS: 1% lidocaine to skin and subcutaneous tissue COMPLICATIONS: None immediate. PROCEDURE: An ultrasound guided thoracentesis was thoroughly discussed with the patient and questions answered. The benefits, risks, alternatives and complications were also discussed. The patient understands and wishes to proceed with the procedure. Written consent was obtained. Ultrasound was performed to localize and mark an adequate pocket of fluid in the right chest. The area was then prepped and draped in the normal sterile fashion. 1% Lidocaine was used for local anesthesia. Under ultrasound guidance a 6 Fr Safe-T-Centesis catheter was introduced. Thoracentesis was performed. The catheter was removed  and a dressing applied. FINDINGS: A total of approximately 1.3 liters of blood-tinged fluid was removed. Samples were sent to the laboratory as requested by the clinical team. IMPRESSION: Successful ultrasound guided diagnostic and therapeutic right thoracentesis yielding 1.3 liters of pleural fluid. Read by: Rowe Robert, PA-C Electronically Signed   By: Markus Daft M.D.   On: 05/26/2021 15:10     Procedures: right US thoracentesis   Subjective: Patient with improvement in dyspnea, no nausea or vomiting, mild chest pain with deep inspiration.   Discharge Exam: Vitals:   06/09/21 1110 06/09/21 1209  BP: 119/63 134/78  Pulse:  (!) 109  Resp: 20   Temp:  98.9 F (37.2 C)  SpO2: 96% 95%   Vitals:   06/09/21 0755 06/09/21 0758 06/09/21 1110 06/09/21 1209  BP:   119/63 134/78  Pulse:    (!) 109  Resp:   20   Temp:    98.9 F (37.2 C)  TempSrc:    Oral  SpO2: 94% 94% 96% 95%  Weight:      Height:        General: Not in pain or dyspnea, deconditioned  Neurology: Awake and alert, non focal  E ENT: no pallor, no icterus, oral mucosa moist Cardiovascular: No JVD. S1-S2 present, rhythmic, no gallops, rubs, or murmurs. No lower extremity edema. Pulmonary: decreased breath sounds at the right base with no wheezing, rales or rhonchi. Gastrointestinal. Abdomen soft and non tender Skin. No rashes Musculoskeletal: no joint deformities   The results of significant diagnostics from this hospitalization (including imaging, microbiology, ancillary and laboratory) are listed below for reference.     Microbiology: Recent Results (from the past 240 hour(s))  SARS CORONAVIRUS 2 (TAT 6-24 HRS) Nasopharyngeal Nasopharyngeal Swab     Status: None   Collection Time: 06/04/21  3:32 PM   Specimen: Nasopharyngeal Swab  Result Value Ref Range Status   SARS Coronavirus 2 NEGATIVE NEGATIVE Final    Comment: (NOTE) SARS-CoV-2 target nucleic acids are NOT DETECTED.  The SARS-CoV-2 RNA is generally  detectable in upper and lower respiratory specimens during the acute phase of infection. Negative results do not preclude SARS-CoV-2 infection, do not rule out co-infections with other pathogens, and should not be used as the sole basis for treatment or other patient management decisions. Negative results must be combined with clinical observations, patient history, and epidemiological information. The expected result is Negative.  Fact Sheet for Patients: SugarRoll.be  Fact Sheet for Healthcare Providers: https://www.woods-mathews.com/  This test is not yet approved or cleared by the [place] FDA and  has been authorized for detection and/or diagnosis of SARS-CoV-2 by FDA under an Emergency Use Authorization (EUA). This EUA will remain  in effect (meaning this test can be used) for the duration of the COVID-19 declaration under Se ction 564(b)(1) of the Act, 21 U.S.C. section 360bbb-3(b)(1), unless the authorization is terminated or revoked sooner.  Performed at [facility], [place] [address], [place], [place] [address]   MRSA Next Gen by PCR, Nasal     Status: None   Collection Time: 06/05/21  8:50 AM   Specimen: Nasal Mucosa; Nasal Swab  Result Value Ref Range Status   MRSA by PCR Next Gen NOT DETECTED NOT DETECTED Final    Comment: (NOTE) The GeneXpert MRSA Assay (FDA approved for NASAL specimens only), is one component of a comprehensive MRSA colonization surveillance program. It is not intended to diagnose MRSA infection nor to guide or monitor treatment for MRSA infections. Test performance is not FDA approved in patients less than 51 years old. Performed at [facility], [place] [address] [place], [place] [address]   Body fluid culture w Gram Stain     Status: None   Collection Time: 06/05/21 12:27 PM   Specimen: PATH Cytology Pleural fluid  Result Value Ref Range Status   Specimen Description    Final    PLEURAL RIGHT Performed at [place] [address] [place], [place] [address]    Special Requests   Final    NONE Performed at [facility], [place] [address], [place], [place] [address]    Gram Stain   Final    RARE WBC PRESENT,BOTH PMN AND MONONUCLEAR NO ORGANISMS SEEN    Culture   Final    NO GROWTH 3 DAYS Performed at [facility], [place] [address], [place], [place] [address]    Report Status 06/08/2021 FINAL  Final     Labs: BNP (last 3 results) Recent Labs    05/25/21 0947 06/04/21 1635  BNP 10.9 237.6*   Basic Metabolic Panel: Recent Labs  Lab 06/04/21 1841 06/04/21 2146 06/05/21 0430 06/05/21 1624 06/06/21 0258 06/07/21 0236 06/08/21 0413 06/09/21 0438  NA  --    < > 137  --  134* 144 145 144  K  --    < > 5.6* 4.8 5.0 4.2 4.4 4.4  CL  --    < > 109  --  104 113* 115* 113*  CO2  --    < > 20*  --  20* 21* 21* 22  GLUCOSE  --    < > 149*  --  167* 102* 105* 98  BUN  --    < > 61*  --  71* 55* 37* 28*  CREATININE  --    < > 1.47*  --  1.50* 1.24 1.05 0.99  CALCIUM  --    < > 8.8*  --  8.7* 9.3 9.1 9.3  MG 1.9  --  2.2  --  2.3  --  1.8 1.8  PHOS  --   --  5.0*  --  4.1  --  3.3 3.1   < > = values in this interval not displayed.   Liver Function Tests: Recent Labs  Lab 06/04/21 2146 06/05/21 0430  AST 164* 219*  ALT 91* 100*  ALKPHOS 145* 150*  BILITOT 2.5* 1.9*  PROT 7.3 7.5  ALBUMIN 2.2* 2.1*   No results for input(s): LIPASE, AMYLASE in the last 168 hours. No results for input(s): AMMONIA in the last 168 hours. CBC: Recent Labs  Lab 06/04/21 1635 06/05/21 0430 06/06/21 0258 06/07/21 0236 06/09/21 0438  WBC 14.6* 12.1* 17.6* 15.4* 17.9*  NEUTROABS 10.9* 10.8*  --   --   --   HGB 10.2* 9.5* 9.5* 9.8* 10.6*  HCT 32.2* 29.6* 28.9* 30.3* 32.6*  MCV 89.0 87.6 85.3 86.3 84.9  PLT 609* 602* 583* 634* 597*   Cardiac Enzymes: No results for input(s): CKTOTAL, CKMB, CKMBINDEX, TROPONINI  in the last 168 hours. BNP: Invalid input(s): POCBNP CBG: Recent Labs  Lab 06/08/21 1110 06/08/21 1608 06/08/21 2043 06/09/21 0733 06/09/21 1118  GLUCAP 198* 227* 146* 95 167*   D-Dimer No results for input(s): DDIMER in the last 72 hours. Hgb A1c No results for input(s): HGBA1C in the last 72 hours. Lipid Profile No results for input(s): CHOL, HDL, LDLCALC, TRIG, CHOLHDL, LDLDIRECT in the last 72 hours. Thyroid function studies No results for input(s): TSH, T4TOTAL, T3FREE, THYROIDAB in the last 72 hours.  Invalid input(s): FREET3 Anemia work up No results for input(s): VITAMINB12, FOLATE, FERRITIN, TIBC, IRON, RETICCTPCT in the last 72 hours. Urinalysis    Component Value Date/Time   COLORURINE YELLOW (A) 06/04/2021 1812   APPEARANCEUR CLOUDY (A) 06/04/2021 1812   LABSPEC 1.020 06/04/2021 1812   PHURINE 5.5 06/04/2021 1812   GLUCOSEU NEGATIVE 06/04/2021 1812   HGBUR TRACE (A) 06/04/2021 1812   BILIRUBINUR NEGATIVE 06/04/2021 1812   KETONESUR NEGATIVE 06/04/2021 1812   PROTEINUR TRACE (A) 06/04/2021 1812   NITRITE NEGATIVE 06/04/2021 1812   LEUKOCYTESUR NEGATIVE 06/04/2021 1812   Sepsis Labs Invalid input(s): PROCALCITONIN,  WBC,  LACTICIDVEN Microbiology Recent Results (from the past 240 hour(s))  SARS CORONAVIRUS 2 (TAT 6-24 HRS) Nasopharyngeal Nasopharyngeal Swab     Status: None   Collection Time: 06/04/21  3:32 PM   Specimen: Nasopharyngeal Swab  Result Value Ref Range Status   SARS Coronavirus 2 NEGATIVE NEGATIVE Final    Comment: (NOTE) SARS-CoV-2 target nucleic acids are NOT DETECTED.  The SARS-CoV-2 RNA is generally detectable in upper and lower respiratory specimens during the acute phase of infection. Negative results do not preclude SARS-CoV-2 infection, do not rule out co-infections with other pathogens, and should not be used as the sole basis for treatment or other patient management decisions. Negative results must be combined with clinical  observations, patient history, and epidemiological information. The expected result is Negative.  Fact Sheet for Patients: SugarRoll.be  Fact Sheet for Healthcare Providers: https://www.woods-mathews.com/  This test is not yet approved or cleared by the [place] FDA and  has been authorized for detection and/or diagnosis of SARS-CoV-2 by FDA under an Emergency Use Authorization (EUA). This EUA will remain  in effect (meaning this test can be used) for the duration of the COVID-19 declaration under Se ction 564(b)(1) of the Act, 21 U.S.C. section 360bbb-3(b)(1), unless the authorization is terminated or revoked sooner.  Performed at [facility], [place] [address], [place], [place] [address]   MRSA Next Gen by PCR, Nasal     Status: None   Collection Time: 06/05/21  8:50 AM   Specimen: Nasal Mucosa; Nasal Swab  Result Value Ref Range Status   MRSA by PCR Next Gen NOT DETECTED NOT DETECTED Final    Comment: (NOTE) The GeneXpert MRSA Assay (FDA approved for NASAL  specimens only), is one component of a comprehensive MRSA colonization surveillance program. It is not intended to diagnose MRSA infection nor to guide or monitor treatment for MRSA infections. Test performance is not FDA approved in patients less than 62 years old. Performed at [facility], [place] [address], [place], [place] [address]   Body fluid culture w Gram Stain     Status: None   Collection Time: 06/05/21 12:27 PM   Specimen: PATH Cytology Pleural fluid  Result Value Ref Range Status   Specimen Description   Final    PLEURAL RIGHT Performed at [place] [address] [place], [place] [address]    Special Requests   Final    NONE Performed at [facility], [place] [address], [place], [place] [address]    Gram Stain   Final    RARE WBC PRESENT,BOTH PMN AND MONONUCLEAR NO ORGANISMS SEEN    Culture    Final    NO GROWTH 3 DAYS Performed at [facility], [place] [address], [place],  [address]    Report Status 06/08/2021 FINAL  Final     Time coordinating discharge: 45 minutes  SIGNED:   Tawni Millers, MD  [facility] 06/09/2021, 12:39 PM

## 2021-06-09 NOTE — Care Management (Signed)
    Durable Medical Equipment  (From admission, onward)           Start     Ordered   06/09/21 1316  For home use only DME Hospital bed  Once       Question Answer Comment  Length of Need 6 Months   The above medical condition requires: Patient requires the ability to reposition frequently   Head must be elevated greater than: 30 degrees   Bed type Semi-electric      06/09/21 1315

## 2021-06-09 NOTE — Progress Notes (Signed)
Physical Therapy Treatment Patient Details Name: Kevin Hobbs MRN: [id number] DOB: 1940-10-21 Today's Date: 06/09/2021    History of Present Illness This 80 years old male with PMH significant for recently diagnosed acute pulmonary embolism, DVT,  pleural effusion and right middle lobe lung mass on ,  type 2 diabetes presented in the ED 06/04/21 with  acute hypoxic respiratory failure. On 2 L home O2.    PT Comments    Pt OOB in recliner.  General Comments: AxO x 2 "slightly foggy" but cooperative.  Family ststed he was "up all night".  Assisted with amb an increased distance in hallway.  General transfer comment: pt was able to self rise from elevated bed at [facility] but did need increased assist with stand to sit to control decend.  75% VC's on proper hand placement. General Gait Details: Daughter present and assist by following with recliner.  Remained on 2 lts which is his prior oxygen need at home.  Tolerated an increased distance.  Avg HR 118 and sats 94% on 2 lts but distance limited by 3/4 dyspnea. Pt plans to D/C to home with 24/7 family support.  They plan to use a wheelchair to get him into house.    Follow Up Recommendations  Home health PT;Supervision/Assistance - 24 hour     Equipment Recommendations  None recommended by PT    Recommendations for Other Services       Precautions / Restrictions Precautions Precautions: Fall Precaution Comments: monitor sats    Mobility  Bed Mobility               General bed mobility comments: OOB in recliner    Transfers Overall transfer level: Needs assistance Equipment used: Rolling walker (2 wheeled) Transfers: Sit to/from [facility] Sit to Stand: Min guard Stand pivot transfers: Min guard;Min assist       General transfer comment: pt was able to self rise from elevated bed at [facility] but did need increased assist with stand to sit to control decend.  75% VC's on proper hand  placement.  Ambulation/Gait Ambulation/Gait assistance: Min assist;+2 physical assistance;+2 safety/equipment;Min guard Gait Distance (Feet): 38 Feet Assistive device: Rolling walker (2 wheeled) Gait Pattern/deviations: Step-to pattern;Decreased step length - left;Decreased step length - right Gait velocity: decreased   General Gait Details: Daughter present and assist by following with recliner.  Remained on 2 lts which is his prior oxygen need at home.  Tolerated an increased distance.  Avg HR 118 and sats 94% on 2 lts but distance limited by 3/4 dyspnea.   Stairs             Wheelchair Mobility    Modified Rankin (Stroke Patients Only)       Balance                                            Cognition Arousal/Alertness: Awake/alert Behavior During Therapy: WFL for tasks assessed/performed Overall Cognitive Status: Impaired/Different from baseline                                 General Comments: AxO x 2 "slightly foggy" but cooperative.  Family ststed he was "up all night"      Exercises      General Comments  Pertinent Vitals/Pain Pain Assessment: No/denies pain    Home Living                      Prior Function            PT Goals (current goals can now be found in the care plan section) Progress towards PT goals: Progressing toward goals    Frequency    Min 3X/week      PT Plan Current plan remains appropriate    Co-evaluation              AM-PAC PT "6 Clicks" Mobility   Outcome Measure  Help needed turning from your back to your side while in a flat bed without using bedrails?: A Little Help needed moving from lying on your back to sitting on the side of a flat bed without using bedrails?: A Little Help needed moving to and from a bed to a chair (including a wheelchair)?: A Little Help needed standing up from a chair using your arms (e.g., wheelchair or bedside chair)?: A  Little Help needed to walk in hospital room?: A Little Help needed climbing 3-5 steps with a railing? : A Lot 6 Click Score: 17    End of Session Equipment Utilized During Treatment: Gait belt;Oxygen Activity Tolerance: Patient limited by fatigue Patient left: in chair;with call bell/phone within reach;with chair alarm set;with family/visitor present Nurse Communication: Mobility status PT Visit Diagnosis: Difficulty in walking, not elsewhere classified (R26.2)     Time: 8453-6468 PT Time Calculation (min) (ACUTE ONLY): 28 min  Charges:  $Gait Training: 8-22 mins $Therapeutic Activity: 8-22 mins                     Rica Koyanagi  PTA Acute  Rehabilitation Services Pager      [phone] Office      [phone]

## 2021-06-09 NOTE — Plan of Care (Signed)
  Problem: Education: Goal: Knowledge of General Education information will improve Description: Including pain rating scale, medication(s)/side effects and non-pharmacologic comfort measures Outcome: Adequate for Discharge   Problem: Health Behavior/Discharge Planning: Goal: Ability to manage health-related needs will improve Outcome: Adequate for Discharge   Problem: Clinical Measurements: Goal: Ability to maintain clinical measurements within normal limits will improve Outcome: Adequate for Discharge Goal: Diagnostic test results will improve Outcome: Adequate for Discharge Goal: Respiratory complications will improve Outcome: Adequate for Discharge   Problem: Nutrition: Goal: Adequate nutrition will be maintained Outcome: Adequate for Discharge   Problem: Safety: Goal: Ability to remain free from injury will improve Outcome: Adequate for Discharge

## 2021-06-09 NOTE — Plan of Care (Signed)
  Problem: Education: Goal: Knowledge of General Education information will improve Description: Including pain rating scale, medication(s)/side effects and non-pharmacologic comfort measures Outcome: Progressing   Problem: Clinical Measurements: Goal: Respiratory complications will improve Outcome: Progressing   Problem: Safety: Goal: Ability to remain free from injury will improve Outcome: Progressing   

## 2021-06-10 ENCOUNTER — Inpatient Hospital Stay (HOSPITAL_COMMUNITY): Payer: PPO

## 2021-06-10 ENCOUNTER — Telehealth: Payer: Self-pay | Admitting: *Deleted

## 2021-06-10 DIAGNOSIS — I6389 Other cerebral infarction: Secondary | ICD-10-CM

## 2021-06-10 DIAGNOSIS — E119 Type 2 diabetes mellitus without complications: Secondary | ICD-10-CM | POA: Diagnosis not present

## 2021-06-10 DIAGNOSIS — I639 Cerebral infarction, unspecified: Secondary | ICD-10-CM | POA: Diagnosis not present

## 2021-06-10 DIAGNOSIS — K219 Gastro-esophageal reflux disease without esophagitis: Secondary | ICD-10-CM | POA: Diagnosis not present

## 2021-06-10 DIAGNOSIS — I10 Essential (primary) hypertension: Secondary | ICD-10-CM | POA: Diagnosis not present

## 2021-06-10 DIAGNOSIS — J9621 Acute and chronic respiratory failure with hypoxia: Secondary | ICD-10-CM | POA: Diagnosis not present

## 2021-06-10 LAB — GLUCOSE, CAPILLARY
Glucose-Capillary: 185 mg/dL — ABNORMAL HIGH (ref 70–99)
Glucose-Capillary: 86 mg/dL (ref 70–99)

## 2021-06-10 LAB — CYTOLOGY - NON PAP

## 2021-06-10 MED ORDER — METFORMIN HCL ER 500 MG PO TB24
1000.0000 mg | ORAL_TABLET | Freq: Every day | ORAL | Status: DC
  Filled 2021-06-10: qty 2

## 2021-06-10 MED ORDER — GUAIFENESIN-DM 100-10 MG/5ML PO SYRP
10.0000 mL | ORAL_SOLUTION | ORAL | Status: DC | PRN
  Administered 2021-06-11 – 2021-06-13 (×4): 10 mL via ORAL
  Filled 2021-06-10 (×5): qty 10

## 2021-06-10 NOTE — Telephone Encounter (Signed)
I called Mr. Grewell today to get him re-scheduled with Dr. Julien Nordmann due to his missed appt.  I was unable to reach but did leave vm message with my name and phone number to call.

## 2021-06-10 NOTE — Plan of Care (Signed)
  Problem: Education: Goal: Knowledge of General Education information will improve Description: Including pain rating scale, medication(s)/side effects and non-pharmacologic comfort measures Outcome: Progressing   Problem: Clinical Measurements: Goal: Diagnostic test results will improve Outcome: Progressing   Problem: Safety: Goal: Ability to remain free from injury will improve Outcome: Progressing   

## 2021-06-10 NOTE — Progress Notes (Signed)
Occupational Therapy Treatment Patient Details Name: Kevin Hobbs MRN: [id number] DOB: 27-Jun-1941 Today's Date: 06/10/2021    History of present illness This 80 years old male with PMH significant for recently diagnosed acute pulmonary embolism, DVT,  pleural effusion and right middle lobe lung mass on ,  type 2 diabetes presented in the ED 06/04/21 with  acute hypoxic respiratory failure. On 2 L home O2.   OT comments  Treatment focused on self care task and functional mobility. Initially ambulated to bathroom to perform standing grooming task but  superceded by toileting need. Patient min guard for toileting transfer - on and off the commode- and min assist for clothing management. Patient ambulated back to room and seated himself in recliner. Patient on 2 L [place] - o2 sat monitor not working during treatment. Patient reports fatigue after short activity. Continue POC.   Follow Up Recommendations  Home health OT    Equipment Recommendations  None recommended by OT    Recommendations for Other Services      Precautions / Restrictions Precautions Precautions: Fall Precaution Comments: monitor sats Restrictions Weight Bearing Restrictions: No       Mobility Bed Mobility Overal bed mobility: Needs Assistance Bed Mobility: Supine to Sit     Supine to sit: Mod assist;HOB elevated     General bed mobility comments: Mod assist to assist with pivoting of hips and scooting forward and patient using bed rails.    Transfers Overall transfer level: Needs assistance Equipment used: Rolling walker (2 wheeled) Transfers: Sit to/from [facility] Sit to Stand: Min guard Stand pivot transfers: Min guard       General transfer comment: Min guard to ambulate to bathroom, perform toilet transfer, and then walk to recliner. [place] on 2 L [place].    Balance   Sitting-balance support: No upper extremity supported Sitting balance-Leahy Scale: Good     Standing balance  support: During functional activity;Bilateral upper extremity supported Standing balance-Leahy Scale: Fair                             ADL either performed or assessed with clinical judgement   ADL Overall ADL's : Needs assistance/impaired                         Toilet Transfer: Min guard;Regular Toilet;Grab bars;RW Armed forces technical officer Details (indicate cue type and reason): cue to use grab bar Toileting- Clothing Manipulation and Hygiene: Minimal assistance;Sit to/from stand Toileting - Clothing Manipulation Details (indicate cue type and reason): min assist to manage hospital gown.             Vision Patient Visual Report: No change from baseline     Perception     Praxis      Cognition Arousal/Alertness: Awake/alert Behavior During Therapy: WFL for tasks assessed/performed Overall Cognitive Status: Within Functional Limits for tasks assessed                                          Exercises     Shoulder Instructions       General Comments      Pertinent Vitals/ Pain       Pain Assessment: Faces Pain Location: left middle finger Pain Descriptors / Indicators: Sore Pain Intervention(s): Monitored during session  Home Living  Prior Functioning/Environment              Frequency  Min 2X/week        Progress Toward Goals  OT Goals(current goals can now be found in the care plan section)  Progress towards OT goals: Progressing toward goals  Acute Rehab OT Goals Patient Stated Goal: go home with some extra help OT Goal Formulation: With patient/family Time For Goal Achievement: 06/25/2021 Potential to Achieve Goals: Good  Plan Discharge plan remains appropriate    Co-evaluation          OT goals addressed during session: ADL's and self-care      AM-PAC OT "6 Clicks" Daily Activity     Outcome Measure   Help from another person eating meals?:  A Little Help from another person taking care of personal grooming?: A Little Help from another person toileting, which includes using toliet, bedpan, or urinal?: A Lot Help from another person bathing (including washing, rinsing, drying)?: A Lot Help from another person to put on and taking off regular upper body clothing?: A Little Help from another person to put on and taking off regular lower body clothing?: A Lot 6 Click Score: 15    End of Session Equipment Utilized During Treatment: Rolling walker;Oxygen  OT Visit Diagnosis: Unsteadiness on feet (R26.81);Muscle weakness (generalized) (M62.81)   Activity Tolerance Patient tolerated treatment well   Patient Left in chair;with call bell/phone within reach;with family/visitor present   Nurse Communication Other (comment) (okay to see)        Time: 1332-1400 OT Time Calculation (min): 28 min  Charges: OT General Charges $OT Visit: 1 Visit OT Treatments $Self Care/Home Management : 8-22 mins $Therapeutic Activity: 8-22 mins  Doraine Schexnider, OTR/L [place]  Office [phone] Pager: [place] 06/10/2021, 3:20 PM

## 2021-06-10 NOTE — Progress Notes (Addendum)
PROGRESS NOTE    [name]  IZT:[id number] DOB: 1941-09-21 DOA: 06/04/2021 PCP: Robyne Peers, MD    Brief Narrative:  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  Incidental bilateral CVA pending neurologic workup at [facility].   80 year old male past medical history for pulmonary embolism, acute lower extremity DVT, non-small cell lung cancer, and type 2 diabetes mellitus, who presented with recurrent dyspnea.  He had a recent hospitalization 8/23-8/25/2022 when he was diagnosed with pulmonary embolism, right pleural effusion and right middle lobe lung mass.  Underwent thoracentesis and 1.3 L of fluid was removed.  At home he had progressive recurrent dyspnea associated with cough and orthopnea.   Because of persistent and worsening symptoms he came back to the hospital.  On his initial physical examination he was afebrile, heart rate 78, blood pressure 117/58, respiratory rate 22-28, oxygen saturation 90% on 2 L of supplemental oxygen per nasal cannula.  His lungs had decreased breath sounds at the right side, no wheezing or rhonchi, heart S1-S2, present, rhythmic, soft abdomen, no lower extremity edema.   Sodium 136, potassium 5.3, chloride 107, bicarb 20, glucose 99, BUN 61, creatinine 1.49, AST 164, ALT 91, white count 12.1, hemoglobin 9.5, hematocrit 39.6, platelets 602 SARS COVID-19 negative.   Chest radiograph with right pleural effusion, right lower/ middle  lobe opacity.   EKG 88 bpm, normal axis, normal intervals, Q-wave V1, normal sinus rhythm, no significant ST segment or T wave changes.   Patient was admitted to the medical ward, he was placed on supplemental oxygen per nasal cannula. Underwent thoracentesis 22 cc of amber fluid was obtained. Received antibiotic therapy for possible post-obstructive pneumonia  with improvement of his symptoms.  Brain MRI was performed as part of lung cancer staging finding new CVA bilateral. Neurology was consulted with recommendations for transfer to [facility] for further neurologic workup.    Assessment & Plan:   Principal Problem:   Acute on chronic respiratory failure with hypoxemia (HCC) Active Problems:   Pulmonary embolism (HCC)   Pleural effusion   SOB (shortness of breath)   Diabetes mellitus without complication (HCC)   Essential hypertension   Hyperkalemia   GERD (gastroesophageal reflux disease)   Cerebrovascular accident (CVA) ([place])     Acute on chronic hypoxemic respiratory failure/postobstructive pneumonia.  Swallow dysfunction. COPD exacerbation.  sp 5 days of azithromycin and prednisone along with albuterol as needed.  Speech therapy recommended a dysphagia 3 diet. His oxymetry at discharge is 94% on 2 L/min per [name]   Patient is feeling better his dyspnea has improved, has intermittent cough today, not productive.  His oxygen requirements have decreased to 2 L from 5 l/min per [name].  He has home 02 arranged and a home hospital bed.    Reactive leukocytosis due to steroids.   2.  Right middle lobe non-small cell carcinoma, malignant right pleural effusion/ incidental new CVA (bilateral) Brain MRI done for screening showed multiple scattered subcentimeter foci of diffusion abnormality involving the periventricular, deep and subcortical white matter of the cerebral hemispheres, consistent with acute to early subacute ischemic infarcts.  No evidence of intracranial metastatic disease. Few scattered chronic microhemorrhages involving the soft tentorial brain.   Echocardiogram from 08/22 had no intracavitary thrombus.  Consultation with neurology Dr Cheral Marker has recommended patient to be transfer to [facility] for stroke team evaluation.  Possible PFO, continue anticoagulation with apixaban.  Patient has been evaluated be physical therapy with  recommendations for home health PT.  Non focal on neuro examination.    3.  Acute pulmonary embolism.  Tolerating well anticoagulation with apixaban.   4.  Acute kidney injury, hyperkalemia.  renal function has improved, patient is tolerating po well.   5.  Type 2 diabetes mellitus/ dyslipidemia.  fasting glucose this am is 167 and patient tolerating po well. Discontinue insulin therapy and will resume metformin.   On simvastatin.    6.  Hypertension.  On losartan and amlodipine for blood pressure control.    7.  GERD.  PPI   8. Depression. On escitalopram.  Status is: Inpatient  Remains inpatient appropriate because:Inpatient level of care appropriate due to severity of illness  Dispo: The patient is from: Home              Anticipated d/c is to: Home              Patient currently is not medically stable to d/c.   Difficult to place patient No   DVT prophylaxis: Apixaban   Code Status:    full  Family Communication:   I spoke with patient's wife at the bedside, we talked in detail about patient's condition, plan of care and prognosis and all questions were addressed.      Nutrition Status: Nutrition Problem: Inadequate oral intake Etiology: decreased appetite Signs/Symptoms: per patient/family report Interventions: MVI, Ensure Enlive (each supplement provides 350kcal and 20 grams of protein)    Consultants:  Neurology   Procedures:  Right thoracentesis    Subjective: Patient is feeling better, no nausea or vomiting, dyspnea continue to improve.   Objective: Vitals:   06/09/21 1906 06/09/21 2125 06/10/21 0500 06/10/21 0532  BP:  (!) 120/58  116/60  Pulse:  75  90  Resp:  18  18  Temp:  98.4 F (36.9 C)  98.1 F (36.7 C)  TempSrc:  Oral  Oral  SpO2: 92% 98%  94%  Weight:   105.7 kg   Height:        Intake/Output Summary (Last 24 hours) at 06/10/2021 0844 Last data filed at 06/10/2021 0610 Gross per 24 hour  Intake 360 ml  Output 1000 ml  Net -640 ml    Filed Weights   06/08/21 0446 06/09/21 0449 06/10/21 0500  Weight: 107.8 kg 106.5 kg 105.7 kg    Examination:   General: Not in pain or dyspnea  Neurology: Awake and alert, non focal  E ENT: no pallor, no icterus, oral mucosa moist Cardiovascular: No JVD. S1-S2 present, rhythmic, no gallops, rubs, or murmurs. No lower extremity edema. Pulmonary: decreased breath sounds right base with no wheezing, rhonchi or rales. Gastrointestinal. Abdomen soft and non tender Skin. No rashes Musculoskeletal: no joint deformities     Data Reviewed: I have personally reviewed following labs and imaging studies  CBC: Recent Labs  Lab 06/04/21 1635 06/05/21 0430 06/06/21 0258 06/07/21 0236 06/09/21 0438  WBC 14.6* 12.1* 17.6* 15.4* 17.9*  NEUTROABS 10.9* 10.8*  --   --   --   HGB 10.2* 9.5* 9.5* 9.8* 10.6*  HCT 32.2* 29.6* 28.9* 30.3* 32.6*  MCV 89.0 87.6 85.3 86.3 84.9  PLT 609* 602* 583* 634* 270*   Basic Metabolic Panel: Recent Labs  Lab 06/04/21 1841 06/04/21 2146 06/05/21 0430 06/05/21 1624 06/06/21 0258 06/07/21 0236 06/08/21 0413 06/09/21 0438  NA  --    < >  137  --  134* 144 145 144  K  --    < > 5.6* 4.8 5.0 4.2 4.4 4.4  CL  --    < > 109  --  104 113* 115* 113*  CO2  --    < > 20*  --  20* 21* 21* 22  GLUCOSE  --    < > 149*  --  167* 102* 105* 98  BUN  --    < > 61*  --  71* 55* 37* 28*  CREATININE  --    < > 1.47*  --  1.50* 1.24 1.05 0.99  CALCIUM  --    < > 8.8*  --  8.7* 9.3 9.1 9.3  MG 1.9  --  2.2  --  2.3  --  1.8 1.8  PHOS  --   --  5.0*  --  4.1  --  3.3 3.1   < > = values in this interval not displayed.   GFR: Estimated Creatinine Clearance: 71.2 mL/min (by C-G formula based on SCr of 0.99 mg/dL). Liver Function Tests: Recent Labs  Lab 06/04/21 2146 06/05/21 0430  AST 164* 219*  ALT 91* 100*  ALKPHOS 145* 150*  BILITOT 2.5* 1.9*  PROT 7.3 7.5  ALBUMIN 2.2* 2.1*   No results for input(s): LIPASE, AMYLASE in the last 168 hours. No results  for input(s): AMMONIA in the last 168 hours. Coagulation Profile: No results for input(s): INR, PROTIME in the last 168 hours. Cardiac Enzymes: No results for input(s): CKTOTAL, CKMB, CKMBINDEX, TROPONINI in the last 168 hours. BNP (last 3 results) No results for input(s): PROBNP in the last 8760 hours. HbA1C: No results for input(s): HGBA1C in the last 72 hours. CBG: Recent Labs  Lab 06/08/21 1110 06/08/21 1608 06/08/21 2043 06/09/21 0733 06/09/21 1118  GLUCAP 198* 227* 146* 95 167*   Lipid Profile: No results for input(s): CHOL, HDL, LDLCALC, TRIG, CHOLHDL, LDLDIRECT in the last 72 hours. Thyroid Function Tests: No results for input(s): TSH, T4TOTAL, FREET4, T3FREE, THYROIDAB in the last 72 hours. Anemia Panel: No results for input(s): VITAMINB12, FOLATE, FERRITIN, TIBC, IRON, RETICCTPCT in the last 72 hours.    Radiology Studies: I have reviewed all of the imaging during this hospital visit personally     Scheduled Meds:  amLODipine  10 mg Oral Daily   apixaban  5 mg Oral BID   feeding supplement  237 mL Oral BID BM   insulin aspart  0-9 Units Subcutaneous TID WC   losartan  100 mg Oral Daily   mouth rinse  15 mL Mouth Rinse BID   mometasone-formoterol  2 puff Inhalation BID   multivitamin with minerals  1 tablet Oral Daily   pantoprazole  40 mg Oral Daily   simvastatin  10 mg Oral Daily   umeclidinium bromide  1 puff Inhalation Daily   Continuous Infusions:   LOS: 6 days        Kevin Butner Gerome Apley, MD

## 2021-06-10 NOTE — Progress Notes (Signed)
  Echocardiogram 2D Echocardiogram has been performed.  Kevin Hobbs 06/10/2021, 3:43 PM

## 2021-06-10 NOTE — Care Plan (Signed)
Report called to Lennette Bihari, RN at [facility]. [facility] called for transport.

## 2021-06-10 NOTE — Consult Note (Signed)
Neurology Consultation  Reason for Consult: strokes on MRI Referring Physician: Dr Cathlean Sauer  CC: abnormal brain imaging, cc for admission was worsening SOB  History is obtained from:patient, wife, chart  HPI: Kevin Hobbs is a 80 y.o. male PMH of DM, hypertension, hyperlipidemia, lung cancer (rt middle lobe SCLC), worsening and recurrent dyspnea as presentation, pleural effusion, pulmonary embolism, DVT, being evaluated for above symptoms and the diagnosis of lung cancer being made in the process of all of the above, got a screening MRI of the brain which showed bihemispheric strokes for which neurological consultation was obtained. Patient and wife deny any neurological symptoms including tingling, numbness, weakness, facial asymmetry, visual deficits, word finding difficulty or slurred speech.  Denies any headaches. Main complaints are primarily related to the medical issues currently being addressed by the primary team, pulmonology and oncology. On review of systems-reports unintentional weight loss, shortness of breath, cough. Currently on anticoagulation with apixaban-no complaints related to bleeding side effects.  LKW: Unclear tpa given?: no, outside the window/unclear last known well Premorbid modified Rankin scale (mRS): 3 ROS: Full ROS was performed and is negative except as noted in the HPI. Past Medical History:  Diagnosis Date   Acute DVT (deep venous thrombosis) (HCC)    LLE; Aug '22   Acute pulmonary embolism [facility])    Aug '22   Diabetes mellitus without complication ([place])    Hypertension    Pleural effusion    right-sided    History reviewed. No pertinent family history.  Social History:   reports that he has quit smoking. His smoking use included cigarettes. He has never used smokeless tobacco. He reports that he does not currently use alcohol. He reports that he does not use drugs.  Medications  Current Facility-Administered Medications:    acetaminophen  (TYLENOL) tablet 650 mg, 650 mg, Oral, Q6H PRN, 650 mg at 06/09/21 1215 **OR** acetaminophen (TYLENOL) suppository 650 mg, 650 mg, Rectal, Q6H PRN, Howerter, Justin B, DO   albuterol (PROVENTIL) (2.5 MG/3ML) 0.083% nebulizer solution 2.5 mg, 2.5 mg, Inhalation, Q4H PRN, Howerter, Justin B, DO, 2.5 mg at 06/08/21 1535   amLODipine (NORVASC) tablet 10 mg, 10 mg, Oral, Daily, Shawna Clamp, MD, 10 mg at 06/10/21 5188   apixaban (ELIQUIS) tablet 5 mg, 5 mg, Oral, BID, Howerter, Justin B, DO, 5 mg at 06/10/21 4166   feeding supplement (ENSURE ENLIVE / ENSURE PLUS) liquid 237 mL, 237 mL, Oral, BID BM, Howerter, Justin B, DO, 237 mL at 06/10/21 0824   guaiFENesin-dextromethorphan (ROBITUSSIN DM) 100-10 MG/5ML syrup 10 mL, 10 mL, Oral, Q4H PRN, Arrien, Jimmy Picket, MD   losartan (COZAAR) tablet 100 mg, 100 mg, Oral, Daily, Shawna Clamp, MD, 100 mg at 06/10/21 0630   MEDLINE mouth rinse, 15 mL, Mouth Rinse, BID, Howerter, Justin B, DO, 15 mL at 06/10/21 0824   [START ON 06/11/2021] metFORMIN (GLUCOPHAGE-XR) 24 hr tablet 1,000 mg, 1,000 mg, Oral, Q breakfast, Arrien, Jimmy Picket, MD   mometasone-formoterol [facility]) 100-5 MCG/ACT inhaler 2 puff, 2 puff, Inhalation, BID, Howerter, Justin B, DO, 2 puff at 06/10/21 0852   multivitamin with minerals tablet 1 tablet, 1 tablet, Oral, Daily, Howerter, Justin B, DO, 1 tablet at 06/10/21 0824   pantoprazole (PROTONIX) EC tablet 40 mg, 40 mg, Oral, Daily, Howerter, Justin B, DO, 40 mg at 06/10/21 0823   simvastatin (ZOCOR) tablet 10 mg, 10 mg, Oral, Daily, Howerter, Justin B, DO, 10 mg at 06/10/21 0823   umeclidinium bromide (INCRUSE ELLIPTA) 62.5 MCG/INH 1 puff,  1 puff, Inhalation, Daily, Howerter, Justin B, DO, 1 puff at 06/10/21 0851  Exam: Current vital signs: BP 130/67 (BP Location: Left Arm)   Pulse (!) 102   Temp 99.7 F (37.6 C) (Oral)   Resp 20   Ht 5\' 7"  (1.702 m)   Wt 105.7 kg   SpO2 96%   BMI 36.50 kg/m  Vital signs in last 24  hours: Temp:  [98.1 F (36.7 C)-99.7 F (37.6 C)] 99.7 F (37.6 C) (09/08 1925) Pulse Rate:  [75-102] 102 (09/08 1925) Resp:  [18-20] 20 (09/08 1925) BP: (116-132)/(58-67) 130/67 (09/08 1925) SpO2:  [94 %-98 %] 96 % (09/08 1925) Weight:  [105.7 kg] 105.7 kg (09/08 0500)  General: Awake alert in no distress HEENT: Normocephalic/atraumatic Lungs: On supplemental oxygen, scattered rales CVS: Regular rate rhythm Abdomen: Soft nontender Extremities warm well perfused Neurological exam Awake alert oriented x2-could not tell me the current month.  Michela Pitcher his age is 5 when he is 80 years old. No dysarthria No aphasia Cranial nerves II to XII intact Motor exam with symmetric nearly 5/5 strength in all extremities with normal tone and range of motion-with generalized deconditioning. Sensory exam: Intact to light touch without extinction Cerebellar exam: No dysmetria Gait testing deferred NIH of scale-2 for questions  Labs I have reviewed labs in epic and the results pertinent to this consultation are:  CBC    Component Value Date/Time   WBC 17.9 (H) 06/09/2021 0438   RBC 3.84 (L) 06/09/2021 0438   HGB 10.6 (L) 06/09/2021 0438   HCT 32.6 (L) 06/09/2021 0438   PLT 597 (H) 06/09/2021 0438   MCV 84.9 06/09/2021 0438   MCH 27.6 06/09/2021 0438   MCHC 32.5 06/09/2021 0438   RDW 16.5 (H) 06/09/2021 0438   LYMPHSABS 0.7 06/05/2021 0430   MONOABS 0.2 06/05/2021 0430   EOSABS 0.0 06/05/2021 0430   BASOSABS 0.1 06/05/2021 0430    CMP     Component Value Date/Time   NA 144 06/09/2021 0438   K 4.4 06/09/2021 0438   CL 113 (H) 06/09/2021 0438   CO2 22 06/09/2021 0438   GLUCOSE 98 06/09/2021 0438   BUN 28 (H) 06/09/2021 0438   CREATININE 0.99 06/09/2021 0438   CALCIUM 9.3 06/09/2021 0438   PROT 7.5 06/05/2021 0430   ALBUMIN 2.1 (L) 06/05/2021 0430   AST 219 (H) 06/05/2021 0430   ALT 100 (H) 06/05/2021 0430   ALKPHOS 150 (H) 06/05/2021 0430   BILITOT 1.9 (H) 06/05/2021 0430    GFRNONAA >60 06/09/2021 0438    Imaging I have reviewed the images obtained: MRI brain with and without contrast with multiple scattered subcentimeter foci of restricted diffusion involving the periventricular, deep and subcortical white matter of both cerebral hemispheres consistent with acute to early subacute ischemic infarctions.  No associated hemorrhage or mass-effect.  No evidence of intracranial metastatic disease.  Few scattered chronic microhemorrhages involving the supratentorial brain likely related to chronic hypertension.  Underlying age-related cerebral atrophy with advanced chronic microvascular ischemic disease.  Lower extremity venous Dopplers with left peroneal DVTs.  CT angiography chest PE with and without contrast-pulmonary embolism with segmental to subsegmental embolus present in the right upper lobe.  Enlargement of main pulmonary artery concerning for pulmonary hypertension.  Large mass centered in the right middle lobe which occludes the middle lobe segmental bronchi although difficult to distinguish from airspace consolidation and adjacent atelectasis approximately measuring 7.9 x 7.7 cm findings most consistent with primary lung malignancy.  Markedly enlarged  subcarinal lymph nodes concerning for nodal metastatic disease.  Moderate right pleural effusion with associated atelectasis or consolidation presumably malignant.  Coronary artery disease.  Echocardiogram done-results pending from 06/10/2021. Echocardiogram from 05/27/2019 IMPRESSIONS   1. Left ventricular ejection fraction, by estimation, is 60 to 65%. The  left ventricle has normal function. Left ventricular endocardial border  not optimally defined to evaluate regional wall motion. The left  ventricular internal cavity size was mildly  to moderately dilated. Left ventricular diastolic parameters are  indeterminate.   2. Right ventricular systolic function is normal. The right ventricular  size is normal.   3.  The mitral valve is normal in structure. No evidence of mitral valve  regurgitation.   4. The aortic valve is grossly normal. Aortic valve regurgitation is not  visualized.     Assessment: 80 year old with lung cancer, noted to have multiple scattered ischemic strokes in bilateral cerebral hemispheres consistent with acute to subacute ischemic strokes likely secondary to hypercoagulability versus paradoxical embolization in the setting of DVT/PE. Possible etiologies also include hypercoagulability from underlying cancer. Head and neck vessel imaging pending to rule out large vessel etiology   Recommendations: Frequent neurochecks Continue apixaban-unclear time of last known well and he has been doing well on apixaban so far without any neurological symptoms. Telemetry No need for permissive hypertension Follow-up echo results CT head and neck-ordered-stroke team will follow A1c, lipid panel Management of malignancy and related complications as you are per pulmonology/oncology/primary team.  Stroke neurology to follow.    -- Amie Portland, MD Neurologist [facility] Pager: [phone]

## 2021-06-11 ENCOUNTER — Inpatient Hospital Stay (HOSPITAL_COMMUNITY): Payer: PPO

## 2021-06-11 DIAGNOSIS — I6340 Cerebral infarction due to embolism of unspecified cerebral artery: Secondary | ICD-10-CM

## 2021-06-11 DIAGNOSIS — J189 Pneumonia, unspecified organism: Principal | ICD-10-CM

## 2021-06-11 DIAGNOSIS — C3490 Malignant neoplasm of unspecified part of unspecified bronchus or lung: Secondary | ICD-10-CM

## 2021-06-11 DIAGNOSIS — Z9889 Other specified postprocedural states: Secondary | ICD-10-CM

## 2021-06-11 LAB — LACTIC ACID, PLASMA
Lactic Acid, Venous: 1.4 mmol/L (ref 0.5–1.9)
Lactic Acid, Venous: 1.1 mmol/L (ref 0.5–1.9)
Lactic Acid, Venous: 1.1 mmol/L (ref 0.5–1.9)

## 2021-06-11 LAB — COMPREHENSIVE METABOLIC PANEL
ALT: 101 U/L — ABNORMAL HIGH (ref 0–44)
AST: 106 U/L — ABNORMAL HIGH (ref 15–41)
Albumin: 1.7 g/dL — ABNORMAL LOW (ref 3.5–5.0)
Alkaline Phosphatase: 150 U/L — ABNORMAL HIGH (ref 38–126)
Anion gap: 13 (ref 5–15)
BUN: 38 mg/dL — ABNORMAL HIGH (ref 8–23)
CO2: 19 mmol/L — ABNORMAL LOW (ref 22–32)
Calcium: 9 mg/dL (ref 8.9–10.3)
Chloride: 106 mmol/L (ref 98–111)
Creatinine, Ser: 1.6 mg/dL — ABNORMAL HIGH (ref 0.61–1.24)
GFR, Estimated: 44 mL/min — ABNORMAL LOW (ref >60)
Glucose, Bld: 128 mg/dL — ABNORMAL HIGH (ref 70–99)
Potassium: 4.1 mmol/L (ref 3.5–5.1)
Sodium: 138 mmol/L (ref 135–145)
Total Bilirubin: 1.6 mg/dL — ABNORMAL HIGH (ref 0.3–1.2)
Total Protein: 6.5 g/dL (ref 6.5–8.1)

## 2021-06-11 LAB — GLUCOSE, CAPILLARY
Glucose-Capillary: 110 mg/dL — ABNORMAL HIGH (ref 70–99)
Glucose-Capillary: 125 mg/dL — ABNORMAL HIGH (ref 70–99)

## 2021-06-11 LAB — CBC
HCT: 31.3 % — ABNORMAL LOW (ref 39.0–52.0)
Hemoglobin: 10.2 g/dL — ABNORMAL LOW (ref 13.0–17.0)
MCH: 27.8 pg (ref 26.0–34.0)
MCHC: 32.6 g/dL (ref 30.0–36.0)
MCV: 85.3 fL (ref 80.0–100.0)
Platelets: 560 10*3/uL — ABNORMAL HIGH (ref 150–400)
RBC: 3.67 MIL/uL — ABNORMAL LOW (ref 4.22–5.81)
RDW: 16.6 % — ABNORMAL HIGH (ref 11.5–15.5)
WBC: 18.6 10*3/uL — ABNORMAL HIGH (ref 4.0–10.5)
nRBC: 0 % (ref 0.0–0.2)

## 2021-06-11 LAB — LIPID PANEL
Cholesterol: 101 mg/dL (ref 0–200)
HDL: 17 mg/dL — ABNORMAL LOW (ref >40)
LDL Cholesterol: 68 mg/dL (ref 0–99)
Total CHOL/HDL Ratio: 5.9 RATIO
Triglycerides: 80 mg/dL (ref <150)
VLDL: 16 mg/dL (ref 0–40)

## 2021-06-11 LAB — HEMOGLOBIN A1C
Hgb A1c MFr Bld: 6.5 % — ABNORMAL HIGH (ref 4.8–5.6)
Mean Plasma Glucose: 139.85 mg/dL

## 2021-06-11 LAB — BRAIN NATRIURETIC PEPTIDE: B Natriuretic Peptide: 19.9 pg/mL (ref 0.0–100.0)

## 2021-06-11 MED ORDER — LACTATED RINGERS IV SOLN: INTRAVENOUS | Status: DC

## 2021-06-11 MED ORDER — FUROSEMIDE 10 MG/ML IJ SOLN
40.0000 mg | Freq: Once | INTRAMUSCULAR | Status: AC
  Administered 2021-06-11: 40 mg via INTRAVENOUS
  Filled 2021-06-11: qty 4

## 2021-06-11 MED ORDER — METRONIDAZOLE 500 MG/100ML IV SOLN
500.0000 mg | Freq: Two times a day (BID) | INTRAVENOUS | Status: AC
  Administered 2021-06-11 – 2021-06-14 (×6): 500 mg via INTRAVENOUS
  Filled 2021-06-11 (×6): qty 100

## 2021-06-11 MED ORDER — ASPIRIN EC 81 MG PO TBEC
81.0000 mg | DELAYED_RELEASE_TABLET | Freq: Every day | ORAL | Status: DC
  Administered 2021-06-11 – 2021-06-18 (×8): 81 mg via ORAL
  Filled 2021-06-11 (×8): qty 1

## 2021-06-11 MED ORDER — IOHEXOL 350 MG/ML SOLN
50.0000 mL | Freq: Once | INTRAVENOUS | Status: AC | PRN
  Administered 2021-06-11: 50 mL via INTRAVENOUS

## 2021-06-11 MED ORDER — SODIUM CHLORIDE 0.9 % IV SOLN: INTRAVENOUS | Status: DC

## 2021-06-11 MED ORDER — SODIUM CHLORIDE 0.9 % IV SOLN
1.0000 g | INTRAVENOUS | Status: DC
  Administered 2021-06-11 – 2021-06-14 (×4): 1 g via INTRAVENOUS
  Filled 2021-06-11 (×4): qty 10

## 2021-06-11 MED ORDER — TRAZODONE HCL 50 MG PO TABS
50.0000 mg | ORAL_TABLET | Freq: Every evening | ORAL | Status: DC | PRN
  Administered 2021-06-11 – 2021-06-17 (×2): 50 mg via ORAL
  Filled 2021-06-11 (×2): qty 1

## 2021-06-11 NOTE — Progress Notes (Addendum)
PROGRESS NOTE    Kevin Hobbs  BDZ:[id number] DOB: 1941/07/21 DOA: 06/04/2021 PCP: Robyne Peers, MD    Brief Narrative:  Patient is a 80 year old male past medical history for pulmonary embolism, acute lower extremity DVT, non-small cell lung cancer, and type 2 diabetes mellitus, who presented with recurrent dyspnea.  He had a recent hospitalization 8/23-8/25/2022 when he was diagnosed with pulmonary embolism, right pleural effusion and right middle lobe lung mass.  Underwent thoracentesis and 1.3 L of fluid was removed.  At home he had progressive recurrent dyspnea associated with cough and orthopnea.   Because of persistent and worsening symptoms he came back to the hospital.  On his initial physical examination he was afebrile, heart rate 78, blood pressure 117/58, respiratory rate 22-28, oxygen saturation 90% on 2 L of supplemental oxygen per nasal cannula.  His lungs had decreased breath sounds at the right side, no wheezing or rhonchi, heart S1-S2, present, rhythmic, soft abdomen, no lower extremity edema.   Sodium 136, potassium 5.3, chloride 107, bicarb 20, glucose 99, BUN 61, creatinine 1.49, AST 164, ALT 91, white count 12.1, hemoglobin 9.5, hematocrit 39.6, platelets 602 SARS COVID-19 negative.   Chest radiograph with right pleural effusion, right lower/ middle  lobe opacity.   EKG 88 bpm, normal axis, normal intervals, Q-wave V1, normal sinus rhythm, no significant ST segment or T wave changes.   Patient was admitted to the medical ward, he was placed on supplemental oxygen per nasal cannula. Underwent thoracentesis 22 cc of amber fluid was obtained. Received antibiotic therapy for possible post-obstructive pneumonia with improvement of his symptoms.  Brain MRI was performed as part of lung cancer staging finding new CVA bilateral. Neurology was consulted with recommendations for transfer to [facility] for further neurologic workup.    Assessment & Plan:   Principal Problem:    Acute on chronic respiratory failure with hypoxemia (HCC) Active Problems:   Pulmonary embolism (HCC)   Pleural effusion   SOB (shortness of breath)   Diabetes mellitus without complication (HCC)   Essential hypertension   Hyperkalemia   GERD (gastroesophageal reflux disease)   Cerebrovascular accident (CVA) ([place])     Acute on chronic hypoxemic respiratory failure/postobstructive pneumonia/fever.  Swallow dysfunction. COPD exacerbation.  He received treatment with 5 days of azithromycin and prednisone along with albuterol as needed.  Speech therapy recommended a dysphagia 3 diet. His oxymetry at discharge is 94% on 2 L/min per [place]  -He is having intermittent nonproductive cough.  He also had fever of 102.1 again.  Chest x-ray per report stable right basilar consolidation, right basilar mass and associated small right pleural effusion.  Given the fever, will start him on IV ceftriaxone, Flagyl.  If he is continuing to have fevers consider chest CT which can be done without contrast to better evaluate. He remains on oxygen.  He has home 02 arranged and a home hospital bed.    Reactive leukocytosis due to steroids.   2.  Right middle lobe carcinoma, malignant right pleural effusion/ incidental new CVA (bilateral) Brain MRI done for screening showed multiple scattered subcentimeter foci of diffusion abnormality involving the periventricular, deep and subcortical white matter of the cerebral hemispheres, consistent with acute to early subacute ischemic infarcts.  No evidence of intracranial metastatic disease. Few scattered chronic microhemorrhages involving the soft tentorial brain.   Echocardiogram from 08/22 had no intracavitary thrombus.  Consultation with neurology Dr Cheral Marker has recommended patient to be transfer to [facility] for stroke team evaluation. No evidence  of intracardiac shunt.  Followed by neurology.  Continue anticoagulation with apixaban, statins.  Patient has been evaluated be  physical therapy with recommendations for home health PT.  Non focal on neuro examination.    3.  Acute pulmonary embolism.  Tolerating anticoagulation with apixaban.   4.  Acute kidney injury, hyperkalemia.  He was given 1 dose of IV Lasix yesterday.  His BUN/creatinine worsened to 36/1.6 today.  Will hold losartan.  We will start him on gentle IV hydration.  Continue to monitor BUN/creatinine.  Avoid nephrotoxic medications.   5.  Type 2 diabetes mellitus/ dyslipidemia.  Metformin was resumed.  However, patient received CTA.  Therefore discontinue the metformin.  6.  Hypertension.  On losartan and amlodipine for blood pressure control.  However, creatinine worsened today.  Will hold the losartan.  Continue amlodipine.  Continue to monitor blood pressure and adjust medications as needed.   7.  GERD.  PPI   8. Depression. On escitalopram.  Status is: Inpatient  Remains inpatient appropriate because:Inpatient level of care appropriate due to severity of illness  Dispo: The patient is from: Home              Anticipated d/c is to: Home              Patient currently is not medically stable to d/c.   Difficult to place patient No   DVT prophylaxis: Apixaban   Code Status:    full  Family Communication: I spoke with patient's wife and daughter at the bedside, we talked in detail about patient's condition, plan of care and prognosis and all questions were addressed.      Nutrition Status: Nutrition Problem: Inadequate oral intake Etiology: decreased appetite Signs/Symptoms: per patient/family report Interventions: MVI, Ensure Enlive (each supplement provides 350kcal and 20 grams of protein)    Consultants:  Neurology   Procedures:  Right thoracentesis    Subjective: He had a fever of 102.1.  Chest x-ray showing findings concerning for postobstructive pneumonia.  BUN/creatinine also worsened.  Objective: Vitals:   06/11/21 0219 06/11/21 0326 06/11/21 0426 06/11/21 0526   BP: 119/67 103/64 118/84 (!) 105/56  Pulse:      Resp: (!) 24 (!) 33 (!) 26 (!) 28  Temp: (!) 101.5 F (38.6 C) 99.7 F (37.6 C) 98.6 F (37 C) 98.7 F (37.1 C)  TempSrc: Oral Oral Oral Oral  SpO2: 95% 95% 95% 95%  Weight:      Height:        Intake/Output Summary (Last 24 hours) at 06/11/2021 0909 Last data filed at 06/11/2021 0130 Gross per 24 hour  Intake --  Output 1450 ml  Net -1450 ml    Filed Weights   06/08/21 0446 06/09/21 0449 06/10/21 0500  Weight: 107.8 kg 106.5 kg 105.7 kg    Examination:   General: Not in pain or dyspnea  Neurology: Awake and alert, non focal  E ENT: no pallor, no icterus, oral mucosa moist Cardiovascular: No JVD. S1-S2 present, rhythmic, no gallops, rubs, or murmurs. No lower extremity edema. Pulmonary: decreased breath sounds right base, rhonchi, no wheezing. Gastrointestinal. Abdomen soft and non tender Skin. No rashes Musculoskeletal: no joint deformities     Data Reviewed: I have personally reviewed following labs and imaging studies  CBC: Recent Labs  Lab 06/04/21 1635 06/05/21 0430 06/06/21 0258 06/07/21 0236 06/09/21 0438 06/11/21 0312  WBC 14.6* 12.1* 17.6* 15.4* 17.9* 18.6*  NEUTROABS 10.9* 10.8*  --   --   --   --  HGB 10.2* 9.5* 9.5* 9.8* 10.6* 10.2*  HCT 32.2* 29.6* 28.9* 30.3* 32.6* 31.3*  MCV 89.0 87.6 85.3 86.3 84.9 85.3  PLT 609* 602* 583* 634* 597* 560*    Basic Metabolic Panel: Recent Labs  Lab 06/04/21 1841 06/04/21 2146 06/05/21 0430 06/05/21 1624 06/06/21 0258 06/07/21 0236 06/08/21 0413 06/09/21 0438 06/11/21 0312  NA  --    < > 137  --  134* 144 145 144 138  K  --    < > 5.6*   < > 5.0 4.2 4.4 4.4 4.1  CL  --    < > 109  --  104 113* 115* 113* 106  CO2  --    < > 20*  --  20* 21* 21* 22 19*  GLUCOSE  --    < > 149*  --  167* 102* 105* 98 128*  BUN  --    < > 61*  --  71* 55* 37* 28* 38*  CREATININE  --    < > 1.47*  --  1.50* 1.24 1.05 0.99 1.60*  CALCIUM  --    < > 8.8*  --  8.7* 9.3  9.1 9.3 9.0  MG 1.9  --  2.2  --  2.3  --  1.8 1.8  --   PHOS  --   --  5.0*  --  4.1  --  3.3 3.1  --    < > = values in this interval not displayed.    GFR: Estimated Creatinine Clearance: 44.1 mL/min (A) (by C-G formula based on SCr of 1.6 mg/dL (H)). Liver Function Tests: Recent Labs  Lab 06/04/21 2146 06/05/21 0430 06/11/21 0312  AST 164* 219* 106*  ALT 91* 100* 101*  ALKPHOS 145* 150* 150*  BILITOT 2.5* 1.9* 1.6*  PROT 7.3 7.5 6.5  ALBUMIN 2.2* 2.1* 1.7*    No results for input(s): LIPASE, AMYLASE in the last 168 hours. No results for input(s): AMMONIA in the last 168 hours. Coagulation Profile: No results for input(s): INR, PROTIME in the last 168 hours. Cardiac Enzymes: No results for input(s): CKTOTAL, CKMB, CKMBINDEX, TROPONINI in the last 168 hours. BNP (last 3 results) No results for input(s): PROBNP in the last 8760 hours. HbA1C: Recent Labs    06/11/21 0312  HGBA1C 6.5*   CBG: Recent Labs  Lab 06/09/21 0733 06/09/21 1118 06/09/21 2123 06/10/21 0731 06/11/21 0009  GLUCAP 95 167* 185* 86 110*    Lipid Profile: Recent Labs    06/11/21 0312  CHOL 101  HDL 17*  LDLCALC 68  TRIG 80  CHOLHDL 5.9   Thyroid Function Tests: No results for input(s): TSH, T4TOTAL, FREET4, T3FREE, THYROIDAB in the last 72 hours. Anemia Panel: No results for input(s): VITAMINB12, FOLATE, FERRITIN, TIBC, IRON, RETICCTPCT in the last 72 hours.    Radiology Studies: I have reviewed all of the imaging during this hospital visit personally     Scheduled Meds:  amLODipine  10 mg Oral Daily   apixaban  5 mg Oral BID   feeding supplement  237 mL Oral BID BM   losartan  100 mg Oral Daily   mouth rinse  15 mL Mouth Rinse BID   metFORMIN  1,000 mg Oral Q breakfast   mometasone-formoterol  2 puff Inhalation BID   multivitamin with minerals  1 tablet Oral Daily   pantoprazole  40 mg Oral Daily   simvastatin  10 mg Oral Daily   umeclidinium bromide  1 puff Inhalation  Daily   Continuous Infusions:   LOS: 7 days   Yaakov Guthrie, MD

## 2021-06-11 NOTE — Progress Notes (Signed)
   06/11/21 0120  Assess: MEWS Score  Temp (!) 102.1 F (38.9 C)  BP 126/70  Pulse Rate (!) 120  Resp (!) 26  Level of Consciousness Alert  SpO2 96 %  O2 Device Nasal Cannula  O2 Flow Rate (L/min) 2 L/min  Assess: MEWS Score  MEWS Temp 2  MEWS Systolic 0  MEWS Pulse 2  MEWS RR 2  MEWS LOC 0  MEWS Score 6  MEWS Score Color Red  Assess: if the MEWS score is Yellow or Red  Were vital signs taken at a resting state? Yes  Focused Assessment Change from prior assessment (see assessment flowsheet)  Does the patient meet 2 or more of the SIRS criteria? Yes  Early Detection of Sepsis Score *See Row Information* High  MEWS guidelines implemented *See Row Information* Yes  Treat  MEWS Interventions Administered prn meds/treatments;Consulted Respiratory Therapy;Escalated (See documentation below)  Pain Scale 0-10  Pain Score 0  Complains of Congestion;Other (Comment) (DYSPNEA)  Interventions Medication (see MAR)  Take Vital Signs  Increase Vital Sign Frequency  Red: Q 1hr X 4 then Q 4hr X 4, if remains red, continue Q 4hrs  Escalate  MEWS: Escalate Red: discuss with charge nurse/RN and provider, consider discussing with RRT  Notify: Charge Nurse/RN  Name of Charge Nurse/RN Notified Hollie Beach  Date Charge Nurse/RN Notified 06/11/21  Time Charge Nurse/RN Notified 0115  Notify: Provider  Provider Name/Title [place]  Date Provider Notified 06/11/21  Time Provider Notified 0134  Notification Type Page  Notification Reason Change in status  Provider response See new orders  Date of Provider Response 06/11/21  Notify: Rapid Response  Name of Rapid Response RN Notified Shanon Brow, RN  Date Rapid Response Notified 06/11/21  Time Rapid Response Notified 0130  Document  Patient Outcome Other (Comment) (interventions still in progress)  Assess: SIRS CRITERIA  SIRS Temperature  1  SIRS Pulse 1  SIRS Respirations  1  SIRS WBC 0  SIRS Score Sum  3

## 2021-06-11 NOTE — Progress Notes (Signed)
Physical Therapy Treatment Patient Details Name: Kevin Hobbs MRN: [id number] DOB: November 28, 1940 Today's Date: 06/11/2021    History of Present Illness This 80 years old male with PMH significant for recently diagnosed acute pulmonary embolism, DVT,  pleural effusion and right middle lobe lung mass on ,  type 2 diabetes presented in the ED 06/04/21 with  acute hypoxic respiratory failure. On 2 L home O2.    PT Comments    Patient received in bed, family present. He is agreeable to PT session. Requires min guard for bed mobility. Transfers with min guard and ambulated 40 feet with RW and min guard assist. Chair to follow. Patient required seated rest halfway through walk. He is limited by fatigue. Patient will continue to benefit from skilled PT while here to improve endurance, strength and independence with mobility.     Follow Up Recommendations  Home health PT;Supervision/Assistance - 24 hour     Equipment Recommendations  None recommended by PT    Recommendations for Other Services       Precautions / Restrictions Precautions Precautions: Fall Precaution Comments: monitor sats Restrictions Weight Bearing Restrictions: No    Mobility  Bed Mobility Overal bed mobility: Needs Assistance Bed Mobility: Supine to Sit     Supine to sit: HOB elevated;Min guard          Transfers Overall transfer level: Needs assistance Equipment used: Rolling walker (2 wheeled) Transfers: Sit to/from Stand Sit to Stand: Min guard            Ambulation/Gait Ambulation/Gait assistance: Min guard Gait Distance (Feet): 40 Feet Assistive device: Rolling walker (2 wheeled) Gait Pattern/deviations: Step-through pattern;Decreased step length - right;Decreased step length - left Gait velocity: decreased   General Gait Details: Patient's daughter brought recliner to follow. Patient needed seated rest after about 20 feet. Then ambulated another 20 feet. O2 saturations remained > 90%  throughout session and HR up to 121. Patient limited by fatigue.   Stairs             Wheelchair Mobility    Modified Rankin (Stroke Patients Only)       Balance Overall balance assessment: Needs assistance Sitting-balance support: Feet supported Sitting balance-Leahy Scale: Good     Standing balance support: Bilateral upper extremity supported;During functional activity Standing balance-Leahy Scale: Fair Standing balance comment: support of Rw                            Cognition Arousal/Alertness: Awake/alert Behavior During Therapy: WFL for tasks assessed/performed Overall Cognitive Status: Within Functional Limits for tasks assessed                                        Exercises      General Comments        Pertinent Vitals/Pain Pain Assessment: No/denies pain    Home Living                      Prior Function            PT Goals (current goals can now be found in the care plan section) Acute Rehab PT Goals Patient Stated Goal: get stronger PT Goal Formulation: With patient/family Time For Goal Achievement: 06/08/2021 Potential to Achieve Goals: Fair Progress towards PT goals: Progressing toward goals    Frequency    Min  3X/week      PT Plan Current plan remains appropriate    Co-evaluation              AM-PAC PT "6 Clicks" Mobility   Outcome Measure  Help needed turning from your back to your side while in a flat bed without using bedrails?: A Little Help needed moving from lying on your back to sitting on the side of a flat bed without using bedrails?: A Little Help needed moving to and from a bed to a chair (including a wheelchair)?: A Little Help needed standing up from a chair using your arms (e.g., wheelchair or bedside chair)?: A Little Help needed to walk in hospital room?: A Little Help needed climbing 3-5 steps with a railing? : A Lot 6 Click Score: 17    End of Session  Equipment Utilized During Treatment: Gait belt;Oxygen Activity Tolerance: Patient limited by fatigue Patient left: in chair;with call bell/phone within reach;with family/visitor present Nurse Communication: Mobility status PT Visit Diagnosis: Difficulty in walking, not elsewhere classified (R26.2)     Time: 1300-1330 PT Time Calculation (min) (ACUTE ONLY): 30 min  Charges:  $Gait Training: 23-37 mins                     Rockelle Heuerman, PT, GCS 06/11/21,2:08 PM

## 2021-06-11 NOTE — TOC Progression Note (Signed)
Transition of Care [facility]) - Progression Note    Patient Details  Name: Kevin Hobbs MRN: [id number] Date of Birth: 06/28/1941  [facility] CM/SW Contact  Pollie Friar, RN Phone Number: 06/11/2021, 11:53 AM  Clinical Narrative:    CM met with the patient and his wife at the bedside. CM inquired about the hospital bed. She states she has not heard from [facility]. CM called Freda Munro with [employer] and made sure they have the order. They will reach out to the spouse for delivery.  Pt has oxygen at home through [place].  TOC following.   Expected Discharge Plan: [place] Barriers to Discharge: No Barriers Identified  Expected Discharge Plan and Services Expected Discharge Plan: [place]         Expected Discharge Date: 06/09/21               DME Arranged: Hospital bed DME Agency: [employer]       HH Arranged: RN, PT [facility] Agency: [place] ([place]) Date [place]: 06/09/21 Time [place]: 1414 Representative spoke with at [place]: [place] ([place]) Interventions    Readmission Risk Interventions No flowsheet data found.

## 2021-06-11 NOTE — Progress Notes (Addendum)
STROKE TEAM PROGRESS NOTE   ATTENDING NOTE: I reviewed above note and agree with the assessment and plan. Pt was seen and examined.   80 year old male with history of diabetes, hypertension, hyperlipidemia, lung cancer complicated with PE and DVT on Eliquis admitted for shortness of breath and pleural effusion.  For further staging lung cancer, MRI was done which showed scattered embolic strokes involving bilateral anterior and posterior circulation, most consistently with hypercoagulable state in the setting of advanced malignancy.  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  LDL 68, A1c 6.5, creatinine 0.99, WBC 17.9.  On exam, patient awake alert, frequent coughing, orientated to people in the place but not orientated to time or age.  No aphasia, able to name and repeat, however mild to moderate dysarthria but no hoarseness.  Follows simple commands.  Visual fields full, no gaze palsy, facial symmetrical, bilateral upper and lower extremity muscle strength symmetrical, sensation symmetrical, finger-to-nose intact bilaterally.  As mentioned above, show etiology likely due to hypercoagulable state in the setting of advanced malignancy.  Patient has been on Eliquis for DVT and PE, recommend to continue.  Given severe intracranial stenosis, recommend add aspirin 81 on top of Eliquis.  Continue home Zocor 10.  Discussed with family and Dr. Barth Kirks primary team, recommend close follow-up with oncology for future plan.  PT/OT recommend home health PT/OT.  For detailed assessment and plan, please refer to above as I have made changes wherever appropriate.   Neurology will sign off. Please call with questions. Pt will follow up with stroke clinic NP at [facility] in about 4 weeks. Thanks for the consult.   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 He received a MRI brain which showed hemispheric strokes. He is currently on eliquis and wife reports high compliance with medical regimen.   On approach at room wife is at bedside providing support.     Vitals:   06/11/21 0219 06/11/21 0326 06/11/21 0426 06/11/21 0526  BP: 119/67 103/64 118/84 (!) 105/56  Pulse:      Resp: (!) 24 (!) 33 (!) 26 (!) 28  Temp: (!) 101.5 F (38.6 C) 99.7 F (37.6 C) 98.6 F (37 C) 98.7 F (37.1 C)  TempSrc: Oral Oral Oral Oral  SpO2: 95% 95% 95% 95%  Weight:      Height:       CBC:  Recent Labs  Lab 06/04/21 1635 06/05/21 0430 06/06/21 0258 06/09/21 0438 06/11/21 0312  WBC 14.6* 12.1*   < > 17.9* 18.6*  NEUTROABS 10.9* 10.8*  --   --   --   HGB 10.2* 9.5*   < > 10.6* 10.2*  HCT 32.2* 29.6*   < > 32.6* 31.3*  MCV 89.0 87.6   < > 84.9 85.3  PLT 609* 602*   < > 597* 560*   < > = values in this interval not displayed.   Basic Metabolic Panel:  Recent Labs  Lab 06/08/21 0413 06/09/21 0438 06/11/21 0312  NA 145 144 138  K 4.4 4.4 4.1  CL 115* 113* 106  CO2 21* 22 19*  GLUCOSE 105* 98 128*  BUN 37* 28* 38*  CREATININE 1.05 0.99 1.60*  CALCIUM 9.1 9.3 9.0  MG 1.8 1.8  --   PHOS  3.3 3.1  --     Lipid Panel:  Recent Labs  Lab 06/11/21 0312  CHOL 101  TRIG 80  HDL 17*  CHOLHDL 5.9  VLDL 16  LDLCALC 68    HgbA1c:  Recent Labs  Lab 06/11/21 0312  HGBA1C 6.5*   Urine Drug Screen: No results for input(s): LABOPIA, COCAINSCRNUR, LABBENZ, AMPHETMU, THCU, LABBARB in the last 168 hours.  Alcohol Level No results for input(s): ETH in the last 168 hours.  IMAGING past 24 hours CT ANGIO HEAD NECK W WO CM  Result Date: 06/11/2021 CLINICAL DATA:  Stroke, follow up EXAM: CT ANGIOGRAPHY HEAD AND NECK TECHNIQUE: Multidetector CT imaging of the head and neck was performed using the standard protocol during bolus administration of intravenous contrast.  Multiplanar CT image reconstructions and MIPs were obtained to evaluate the vascular anatomy. Carotid stenosis measurements (when applicable) are obtained utilizing NASCET criteria, using the distal internal carotid diameter as the denominator. CONTRAST:  42mL OMNIPAQUE IOHEXOL 350 MG/ML SOLN COMPARISON:  None. FINDINGS: CT HEAD FINDINGS Brain: Multiple small acute infarcts in bilateral cerebral hemispheres are better characterized on recent MRI. No evidence of interval acute large vascular territory infarct or acute hemorrhage. No mass lesion, midline shift, hydrocephalus, or extra-axial fluid collection. Patchy white matter hypoattenuation, nonspecific but likely in part related to chronic microvascular ischemic disease. Mild atrophy. Vascular: See below. Skull: No acute fracture. Sinuses: Clear sinuses. Orbits: No acute finding. Review of the MIP images confirms the above findings CTA NECK FINDINGS Aortic arch: Curve vessel origins are patent. Right carotid system: Patent. Mild atherosclerosis of the carotid bifurcation without significant open greater than 50%) stenosis at the bifurcation. Tortuous ICA with moderate stenosis proximally due to abrupt turn/kink. Left carotid system: Atherosclerosis and irregularity at the carotid bifurcation without greater than 50% stenosis relative to the distal vessel. Vertebral arteries: Mildly left dominant. No evidence of dissection, stenosis (50% or greater) or occlusion. Mild atherosclerosis. Skeleton: Reversal of the normal cervical lordosis. Moderate multilevel degenerative disc disease in the upper cervical spine with disc height loss, endplate sclerosis and posterior disc osteophyte complexes. Other neck: There is dilation of the right laryngeal ventricle with medialization of the posterior aspect of the right vocal cord and medialization of the right arytenoid cartilage, suggestive of right vocal cord paralysis. Upper chest: Right pleural effusion, atelectasis, and  right paratracheal and adenopathy, partially imaged and further characterized on recent CT chest from 05/25/2021. Review of the MIP images confirms the above findings CTA HEAD FINDINGS Anterior circulation: Right petrous ICA is patent. Bilateral cavernous and paraclinoid ICA atherosclerosis with severe stenosis versus occlusion of the cavernous right ICA with opacified paraclinoid ICA. Patent right MCA with out high-grade proximal stenosis. Left intracranial ICA is patent with multifocal moderate stenosis. Left MCA is patent without proximal high-grade stenosis. Right A1 ACA is small, probably congenital given prominent left A1 ACA. Otherwise common bilateral ACAs are patent without proximal high-grade stenosis. No aneurysm identified. Posterior circulation: Bilateral intradural vertebral arteries, basilar artery, and posterior cerebral arteries are patent without proximal high-grade stenosis. Mild proximal right P2 PCA stenosis. No aneurysm identified. Venous sinuses: As permitted by contrast timing, patent. Small left transverse sinus and distal transverse sinus arachnoid granulation Review of the MIP images confirms the above findings IMPRESSION: CT head: Multiple small acute infarcts in bilateral cerebral hemispheres are better characterized on recent MRI. No evidence of interval acute large vascular territory infarct or acute hemorrhage. CTA Head: 1. Severe stenosis versus occlusion of the cavernous  right ICA with opacified moderately narrowed paraclinoid right ICA. Patent right MCA. 2. Multifocal moderate left intracranial ICA stenosis. 3. Mild right P2 PCA stenosis. CTA Neck: 1. Tortuous right ICA with moderate stenosis proximally due to abrupt turn/kink. 2. Otherwise, no significant (greater than 50%) stenosis in the neck. 3. 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 Recommend correlation with the presence or absence of hoarseness. 4. Right pleural effusion,  atelectasis, and right paratracheal and adenopathy, partially imaged and further characterized on recent CT chest from 05/25/2021. Electronically Signed   By: Margaretha Sheffield M.D.   On: 06/11/2021 11:32   DG CHEST PORT 1 VIEW  Result Date: 06/11/2021 CLINICAL DATA:  Dyspnea, acute on chronic respiratory failure EXAM: PORTABLE CHEST 1 VIEW COMPARISON:  06/07/2021 FINDINGS: There is dense consolidation of the a right lung base, similar to prior examination and corresponding to the known mass in this region better seen on CT examination of 05/25/2021. Left lung is clear. No pneumothorax. Small right pleural effusion is unchanged. Cardiac size is within normal limits. Pulmonary vascularity is normal. IMPRESSION: Stable right basilar consolidation corresponding to known right basilar mass and associated small right pleural effusion. Electronically Signed   By: Fidela Salisbury M.D.   On: 06/11/2021 01:47   ECHOCARDIOGRAM LIMITED BUBBLE STUDY  Result Date: 06/10/2021    ECHOCARDIOGRAM LIMITED REPORT   Patient Name:   MASSIMO HARTLAND Date of Exam: 06/10/2021 Medical Rec #:  [id number]       Height:       67.0 in Accession #:    [id number]      Weight:       233.0 lb Date of Birth:  1941/09/11       BSA:          2.158 m Patient Age:    56 years        BP:           132/59 mmHg Patient Gender: M               HR:           103 bpm. Exam Location:  Inpatient Procedure: Limited Echo and Saline Contrast Bubble Study Indications:    Stroke 434.91 / I63.9  History:        Patient has prior history of Echocardiogram examinations, most                 recent 05/26/2021. Risk Factors:Diabetes and Hypertension.  Sonographer:    Bernadene Person RDCS Referring Phys: [id number] [place]  1. Limited study to evaluate for interatrial shunt  2. Agitated saline contrast bubble study was negative, with no evidence of any interatrial shunt. Technically difficult study, but no shunting seen FINDINGS  IAS/Shunts:  Agitated saline contrast was given intravenously to evaluate for intracardiac shunting. Agitated saline contrast bubble study was negative, with no evidence of any interatrial shunt. Oswaldo Milian MD Electronically signed by Oswaldo Milian MD Signature Date/Time: 06/10/2021/8:36:09 PM    Final     PHYSICAL EXAM  General: Awake alert in no distress HEENT: Normocephalic/atraumatic Lungs: On supplemental oxygen, breathing comfortably at rest.  CVS: Regular rate rhythm Abdomen: Soft nontender Extremities warm well perfused Neurological exam Awake alert oriented x2-could not tell me the current month or name  the president.  No dysarthria No aphasia Cranial nerves II to XII intact Motor exam with symmetric nearly 5/5 strength in all extremities with normal tone and  range of motion-with generalized deconditioning. Sensory exam: Intact to light touch without extinction Cerebellar exam: No dysmetria Gait testing deferred  ASSESSMENT/PLAN  PEPPER WYNDHAM is a 80 y.o. male PMH of DM, hypertension, hyperlipidemia, lung cancer (rt middle lobe SCLC), worsening and recurrent dyspnea as presentation, pleural effusion, pulmonary embolism, DVT, being evaluated for above symptoms and the diagnosis of lung cancer being made in the process of all of the above, got a screening MRI of the brain which showed bihemispheric strokes.  Patient and wife deny any neurological symptoms including tingling, numbness, weakness, facial asymmetry, visual deficits, word finding difficulty or slurred speech.  Denies any headaches.  Stroke:  bilateral and scattered acute to early subacute infarct embolic most likely secondary to hypercoagulable state from advanced lung cancer  CTH: Multiple small acute infarcts in bilateral cerebral hemispheres arebetter characterized on recent MRI. No evidence of interval acute large vascular territory infarct or acute hemorrhage. CTA head:  1. Severe stenosis versus occlusion of  the cavernous right ICA with opacified moderately narrowed paraclinoid right ICA. Patent right MCA. 2. Multifocal moderate left intracranial ICA stenosis. 3. Mild right P2 PCA stenosis. CTA neck: . Tortuous right ICA with moderate stenosis proximally due to abrupt turn/kink. 2. Otherwise, no significant (greater than 50%) stenosis in the neck.  MRI BRAIN:  1. 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 No associated hemorrhage or mass effect. 2. No evidence for intracranial metastatic disease. 3. Underlying age-related cerebral atrophy with advanced chronic microvascular ischemic disease. 4. Few scattered chronic micro hemorrhages involving the supratentorial brain, likely related to chronic poorly controlled hypertension. 2D Echo/ bubble study: no IA shunt. EF 60-65%, normal LA LDL 68 HgbA1c 6.5 VTE prophylaxis -  scd Eliquis (apixaban) daily prior to admission, now on Eliquis (apixaban) daily. 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 from what I understood Family encouraged to follow up with oncology closely for future plan  Hypertension Home meds:  norvasc, losartan,  Stable Permissive hypertension (OK if < 220/120) but gradually normalize in 5-7 days Long-term BP goal normotensive  Hyperlipidemia Home meds:  simvastatin 10mg ,  resumed in hospital LDL 68, goal <70 Continue statin at discharge  Diabetes type II Controlled Home meds:  metformin HgbA1c 6.5, goal < 7.0 CBGs SSI  Other Stroke Risk  Factors Age >54   Obesity, Body mass index is 36.5 kg/m., BMI >/= 30 associated with increased stroke risk, recommend weight loss, diet and exercise as appropriate   Other Active Problems  Pulmonary embolism (HCC)   Pleural effusion   SOB (shortness of breath)   Diabetes mellitus without complication (HCC)   Essential hypertension   Hyperkalemia   GERD (gastroesophageal reflux disease)    Hospital day # 7     To contact Stroke Continuity provider, please refer to [email or web address] After hours, contact General Neurology

## 2021-06-11 NOTE — Progress Notes (Signed)
Nutrition Follow-up  DOCUMENTATION CODES:  Obesity unspecified  INTERVENTION:  Continue Ensure BID.  Continue MVI with minerals.   Continue to encourage PO intake.  NUTRITION DIAGNOSIS:  Inadequate oral intake related to decreased appetite as evidenced by per patient/family report. - ongoing  GOAL:  Patient will meet greater than or equal to 90% of their needs - progressing  MONITOR:  PO intake, Supplement acceptance, Weight trends, Labs, I & O's  REASON FOR ASSESSMENT:  Malnutrition Screening Tool    ASSESSMENT:  Pt with PMH significant for recently diagnosed acute pulmonary embolism, R pleural effusion, R middle lobe lung mass, recently diagnosed acute L lower extremity DVT, and type 2 DM admitted with acute on chronic hypoxic respiratory failure. 9/3 - thoracentesis (20 cc yellow fluid)  Per Epic, pt with a range of 10-85% PO intake over the last 5 meals, averaging 60% intake.   Patient taking Ensure drinks most of the time.   Appetite appears to be improving.  Admit wt: 107 kg Current wt: 105.7 kg  Pt's weight remaining stable through admission.  Continue current nutrition plan.  Supplements: Ensure Enlive BID  Medications: reviewed; MVI with minerals, Protonix, NaCl @ 75 ml/hr  Labs: reviewed; CBG 86-185 (H) HbA1c: 6.5% (06/11/2021)  Diet Order:   Diet Order             Diet - low sodium heart healthy           Diet Carb Modified Fluid consistency: Thin; Room service appropriate? Yes  Diet effective now                  EDUCATION NEEDS:  No education needs have been identified at this time  Skin:  Skin Assessment: Reviewed RN Assessment  Last BM:  06/08/21  Height:  Ht Readings from Last 1 Encounters:  06/05/21 5\' 7"  (1.702 m)   Weight:  Wt Readings from Last 1 Encounters:  06/10/21 105.7 kg   BMI:  Body mass index is 36.5 kg/m.  Estimated Nutritional Needs:  Kcal:  2150-2350 Protein:  125-135 grams Fluid:  >2L  Derrel Nip, RD,  LDN (she/her/hers) Registered Dietitian I After-Hours/Weekend Pager # in [place]

## 2021-06-11 NOTE — Progress Notes (Signed)
Pt admitted from [facility]. Arrived via stretcher. A/Ox4 except off on month. Denies pain. Oriented to unit and wife at bedside. See flowsheets for assessment.   Later in night became SOB and lung sounds increased rhonchi and congested nonproductive cough. Denies CP. HR 120s and temp 102.1. Albuterol neb given by RT and pt reported feeling less sob after. Labs, EKG and CXR ordered and completed. RR still >20 but not labored and pt denies feeling any more SOB. IV lasix given and RR continues to improve.

## 2021-06-11 NOTE — Progress Notes (Signed)
Notified by RN that pt with weak cough and sounds congested and has rhonchi. Had thorocentesis a few days ago.  STAT CXR ordered.

## 2021-06-12 ENCOUNTER — Inpatient Hospital Stay (HOSPITAL_COMMUNITY): Payer: PPO

## 2021-06-12 DIAGNOSIS — Z7189 Other specified counseling: Secondary | ICD-10-CM

## 2021-06-12 DIAGNOSIS — Z515 Encounter for palliative care: Secondary | ICD-10-CM

## 2021-06-12 LAB — COMPREHENSIVE METABOLIC PANEL
ALT: 67 U/L — ABNORMAL HIGH (ref 0–44)
AST: 48 U/L — ABNORMAL HIGH (ref 15–41)
Albumin: 1.6 g/dL — ABNORMAL LOW (ref 3.5–5.0)
Alkaline Phosphatase: 114 U/L (ref 38–126)
Anion gap: 10 (ref 5–15)
BUN: 39 mg/dL — ABNORMAL HIGH (ref 8–23)
CO2: 19 mmol/L — ABNORMAL LOW (ref 22–32)
Calcium: 8.3 mg/dL — ABNORMAL LOW (ref 8.9–10.3)
Chloride: 108 mmol/L (ref 98–111)
Creatinine, Ser: 1.51 mg/dL — ABNORMAL HIGH (ref 0.61–1.24)
GFR, Estimated: 47 mL/min — ABNORMAL LOW (ref >60)
Glucose, Bld: 129 mg/dL — ABNORMAL HIGH (ref 70–99)
Potassium: 4.3 mmol/L (ref 3.5–5.1)
Sodium: 137 mmol/L (ref 135–145)
Total Bilirubin: 1.1 mg/dL (ref 0.3–1.2)
Total Protein: 5.9 g/dL — ABNORMAL LOW (ref 6.5–8.1)

## 2021-06-12 LAB — GLUCOSE, CAPILLARY
Glucose-Capillary: 159 mg/dL — ABNORMAL HIGH (ref 70–99)
Glucose-Capillary: 109 mg/dL — ABNORMAL HIGH (ref 70–99)
Glucose-Capillary: 155 mg/dL — ABNORMAL HIGH (ref 70–99)

## 2021-06-12 LAB — CBC
HCT: 28.8 % — ABNORMAL LOW (ref 39.0–52.0)
Hemoglobin: 9.2 g/dL — ABNORMAL LOW (ref 13.0–17.0)
MCH: 27.4 pg (ref 26.0–34.0)
MCHC: 31.9 g/dL (ref 30.0–36.0)
MCV: 85.7 fL (ref 80.0–100.0)
Platelets: 467 10*3/uL — ABNORMAL HIGH (ref 150–400)
RBC: 3.36 MIL/uL — ABNORMAL LOW (ref 4.22–5.81)
RDW: 16.9 % — ABNORMAL HIGH (ref 11.5–15.5)
WBC: 17.5 10*3/uL — ABNORMAL HIGH (ref 4.0–10.5)
nRBC: 0 % (ref 0.0–0.2)

## 2021-06-12 MED ORDER — INSULIN ASPART 100 UNIT/ML IJ SOLN: 0.0000 [IU] | Freq: Every day | INTRAMUSCULAR | Status: DC

## 2021-06-12 MED ORDER — MIRTAZAPINE 15 MG PO TABS
7.5000 mg | ORAL_TABLET | Freq: Every day | ORAL | Status: DC
  Administered 2021-06-12 – 2021-06-17 (×6): 7.5 mg via ORAL
  Filled 2021-06-12 (×6): qty 1

## 2021-06-12 MED ORDER — ALPRAZOLAM 0.25 MG PO TABS: 0.2500 mg | ORAL_TABLET | Freq: Two times a day (BID) | ORAL | Status: DC | PRN

## 2021-06-12 MED ORDER — INSULIN ASPART 100 UNIT/ML IJ SOLN
0.0000 [IU] | Freq: Three times a day (TID) | INTRAMUSCULAR | Status: DC
  Administered 2021-06-12 – 2021-06-13 (×2): 3 [IU] via SUBCUTANEOUS
  Administered 2021-06-14 (×2): 2 [IU] via SUBCUTANEOUS
  Administered 2021-06-15: 3 [IU] via SUBCUTANEOUS
  Administered 2021-06-16 (×2): 2 [IU] via SUBCUTANEOUS
  Administered 2021-06-17: 3 [IU] via SUBCUTANEOUS
  Administered 2021-06-17: 5 [IU] via SUBCUTANEOUS
  Administered 2021-06-17: 3 [IU] via SUBCUTANEOUS
  Administered 2021-06-18: 5 [IU] via SUBCUTANEOUS

## 2021-06-12 MED ORDER — FUROSEMIDE 10 MG/ML IJ SOLN
40.0000 mg | Freq: Once | INTRAMUSCULAR | Status: AC
  Administered 2021-06-12: 40 mg via INTRAVENOUS
  Filled 2021-06-12: qty 4

## 2021-06-12 MED ORDER — HYDROCOD POLST-CPM POLST ER 10-8 MG/5ML PO SUER
5.0000 mL | Freq: Two times a day (BID) | ORAL | Status: DC | PRN
Start: 2021-06-12 — End: 2021-06-18
  Administered 2021-06-12 – 2021-06-14 (×3): 5 mL via ORAL
  Filled 2021-06-12 (×4): qty 5

## 2021-06-12 NOTE — Plan of Care (Signed)
  Problem: Education: Goal: Knowledge of General Education information will improve Description: Including pain rating scale, medication(s)/side effects and non-pharmacologic comfort measures Outcome: Progressing   Problem: Health Behavior/Discharge Planning: Goal: Ability to manage health-related needs will improve Outcome: Progressing   Problem: Clinical Measurements: Goal: Ability to maintain clinical measurements within normal limits will improve Outcome: Progressing Goal: Diagnostic test results will improve Outcome: Progressing Goal: Respiratory complications will improve Outcome: Progressing   Problem: Nutrition: Goal: Adequate nutrition will be maintained Outcome: Progressing   Problem: Safety: Goal: Ability to remain free from injury will improve Outcome: Progressing

## 2021-06-12 NOTE — Progress Notes (Signed)
PROGRESS NOTE    Kevin Hobbs  BMW:[id number] DOB: June 04, 1941 DOA: 06/04/2021 PCP: Robyne Peers, MD    Brief Narrative:  80 year old male past medical history for pulmonary embolism, acute lower extremity DVT, non-small cell lung cancer, and type 2 diabetes mellitus, who presented with recurrent dyspnea.  He had a recent hospitalization 8/23-8/25/2022 when he was diagnosed with pulmonary embolism, right pleural effusion and right middle lobe lung mass.  Underwent thoracentesis and 1.3 L of fluid was removed.  At home he had progressive recurrent dyspnea associated with cough and orthopnea.   Because of persistent and worsening symptoms he came back to the hospital.  On his initial physical examination he was afebrile, heart rate 78, blood pressure 117/58, respiratory rate 22-28, oxygen saturation 90% on 2 L of supplemental oxygen per nasal cannula.  His lungs had decreased breath sounds at the right side, no wheezing or rhonchi, heart S1-S2, present, rhythmic, soft abdomen, no lower extremity edema.   Sodium 136, potassium 5.3, chloride 107, bicarb 20, glucose 99, BUN 61, creatinine 1.49, AST 164, ALT 91, white count 12.1, hemoglobin 9.5, hematocrit 39.6, platelets 602 SARS COVID-19 negative.   Chest radiograph with right pleural effusion, right lower/ middle  lobe opacity.   EKG 88 bpm, normal axis, normal intervals, Q-wave V1, normal sinus rhythm, no significant ST segment or T wave changes.   Patient was admitted to the medical ward, he was placed on supplemental oxygen per nasal cannula. Underwent thoracentesis 22 cc of amber fluid was obtained. Received antibiotic therapy for possible post-obstructive pneumonia with improvement of his symptoms.   Brain MRI was performed as part of lung cancer staging finding new CVA bilateral. Neurology was consulted with recommendations for transfer to [facility] for further neurologic workup.     Assessment & Plan:   Principal Problem:   Acute on  chronic respiratory failure with hypoxemia (HCC) Active Problems:   Pulmonary embolism (HCC)   Pleural effusion   SOB (shortness of breath)   Diabetes mellitus without complication (HCC)   Essential hypertension   Hyperkalemia   GERD (gastroesophageal reflux disease)   Cerebrovascular accident (CVA) ([place])    Acute on chronic hypoxemic respiratory failure/postobstructive pneumonia/fever.  Swallow dysfunction. COPD exacerbation.  He received treatment with 5 days of azithromycin and prednisone along with albuterol as needed.  Speech therapy recommended a dysphagia 3 diet. His oxymetry at discharge was 94% on 2 L/min per [place]  -Patient continues with increased cough, recently noted to have a fever of 102.1.  Chest x-ray on 06/11/2021 notable for stable right basilar consolidation, right basilar mass and associated small right pleural effusion.   -Given concerns of developed pneumonia, patient was started on IV ceftriaxone, Flagyl.   -Today, patient reports increased cough and shortness of breath -Repeat and reviewed chest x-ray.  Findings suggestive of pulmonary edema in the setting of concurrent IV fluid hydration. -Have held further IV fluids, give trial of Lasix 40 mg IV x1 -Repeat basic metabolic panel in the morning   2.  Right middle lobe carcinoma, malignant right pleural effusion/ incidental new CVA (bilateral) Brain MRI done for screening showed multiple scattered subcentimeter foci of diffusion abnormality involving the periventricular, deep and subcortical white matter of the cerebral hemispheres, consistent with acute to early subacute ischemic infarcts.  No evidence of intracranial metastatic disease. Few scattered chronic microhemorrhages involving the soft tentorial brain.   Echocardiogram from 08/22 had no intracavitary thrombus.   Consultation with neurology Dr Cheral Marker has recommended patient to  be transfer to [facility] for stroke team evaluation. No evidence of intracardiac shunt.   Followed by neurology.  Continue anticoagulation with apixaban, statins.  Patient has been evaluated be physical therapy with recommendations for home health PT.  Seems to be stable neurologically at this time -Met with family at bedside.  Given patient's recent diagnosis and current medical condition, all are in agreement for palliative care consultation to establish goals of care.   3.  Acute pulmonary embolism.  Tolerating anticoagulation with apixaban.   4.  Acute kidney injury, hyperkalemia.  Recently given IV Lasix with subsequent creatinine in excess of 1.6.  Losartan currently remains on hold.  Patient was given basal IV fluids overnight.   -Renal function has improved somewhat -Given worsening respiratory status with chest x-ray findings, further IV fluids were held and patient is now status post Lasix x1   5.  Type 2 diabetes mellitus/ dyslipidemia.  -Metformin on hold -Continue sliding scale insulin as needed   6.  Hypertension.  Had been on losartan and amlodipine for blood pressure control.  Given concerns of acute renal failure, have held ARB.Marland Kitchen  Continue amlodipine.     7.  GERD.  PPI   8. Depression.  -Patient was prescribed escitalopram prior to admit and reportedly had not taken yet, currently on hold  Status is: Inpatient   DVT prophylaxis: Eliquis Code Status: Full Family Communication: Pt in room, family at bedside  Status is: Inpatient  Remains inpatient appropriate because:Inpatient level of care appropriate due to severity of illness  Dispo: The patient is from: Home              Anticipated d/c is to: Home              Patient currently is not medically stable to d/c.   Difficult to place patient No       Consultants:  Neurology Oncology Critical care  Procedures:  Right thoracentesis   Antimicrobials: Anti-infectives (From admission, onward)    Start     Dose/Rate Route Frequency Ordered Stop   06/11/21 1445  cefTRIAXone (ROCEPHIN) 1 g in  sodium chloride 0.9 % 100 mL IVPB        1 g 200 mL/hr over 30 Minutes Intravenous Every 24 hours 06/11/21 1353     06/11/21 1445  metroNIDAZOLE (FLAGYL) IVPB 500 mg        500 mg 100 mL/hr over 60 Minutes Intravenous Every 12 hours 06/11/21 1354 06/14/21 1444   06/08/21 1000  azithromycin (ZITHROMAX) tablet 500 mg        500 mg Oral Daily 06/07/21 0905 06/09/21 1058   06/05/21 0300  azithromycin (ZITHROMAX) 500 mg in sodium chloride 0.9 % 250 mL IVPB  Status:  Discontinued        500 mg 250 mL/hr over 60 Minutes Intravenous Every 24 hours 06/05/21 0231 06/07/21 0905       Subjective: Reporting increased cough and shortness of breath  Objective: Vitals:   06/12/21 0805 06/12/21 1043 06/12/21 1229 06/12/21 1624  BP:  117/64 109/60 (!) 100/57  Pulse: (!) 109 (!) 108 (!) 110 89  Resp: (!) 27 (!) 30 (!) 28 (!) 25  Temp:  98.2 F (36.8 C) 99.5 F (37.5 C) 99.3 F (37.4 C)  TempSrc:  Oral Oral Oral  SpO2: 95% 93% 94% 97%  Weight:      Height:        Intake/Output Summary (Last 24 hours) at 06/12/2021 1636 Last data filed  at 06/12/2021 1527 Gross per 24 hour  Intake 680 ml  Output 2600 ml  Net -1920 ml   Filed Weights   06/09/21 0449 06/10/21 0500 06/12/21 0500  Weight: 106.5 kg 105.7 kg 105.8 kg    Examination: General exam: Awake, laying in bed, in nad Respiratory system: Increased respiratory effort, coarse breath sounds, no audible wheezing Cardiovascular system: regular rate, s1, s2 Gastrointestinal system: Soft, nondistended, positive BS Central nervous system: CN2-12 grossly intact, strength intact Extremities: Perfused, no clubbing Skin: Normal skin turgor, no notable skin lesions seen Psychiatry: Mood normal // no visual hallucinations   Data Reviewed: I have personally reviewed following labs and imaging studies  CBC: Recent Labs  Lab 06/06/21 0258 06/07/21 0236 06/09/21 0438 06/11/21 0312 06/12/21 0314  WBC 17.6* 15.4* 17.9* 18.6* 17.5*  HGB 9.5*  9.8* 10.6* 10.2* 9.2*  HCT 28.9* 30.3* 32.6* 31.3* 28.8*  MCV 85.3 86.3 84.9 85.3 85.7  PLT 583* 634* 597* 560* 262*   Basic Metabolic Panel: Recent Labs  Lab 06/06/21 0258 06/07/21 0236 06/08/21 0413 06/09/21 0438 06/11/21 0312 06/12/21 0314  NA 134* 144 145 144 138 137  K 5.0 4.2 4.4 4.4 4.1 4.3  CL 104 113* 115* 113* 106 108  CO2 20* 21* 21* 22 19* 19*  GLUCOSE 167* 102* 105* 98 128* 129*  BUN 71* 55* 37* 28* 38* 39*  CREATININE 1.50* 1.24 1.05 0.99 1.60* 1.51*  CALCIUM 8.7* 9.3 9.1 9.3 9.0 8.3*  MG 2.3  --  1.8 1.8  --   --   PHOS 4.1  --  3.3 3.1  --   --    GFR: Estimated Creatinine Clearance: 46.8 mL/min (A) (by C-G formula based on SCr of 1.51 mg/dL (H)). Liver Function Tests: Recent Labs  Lab 06/11/21 0312 06/12/21 0314  AST 106* 48*  ALT 101* 67*  ALKPHOS 150* 114  BILITOT 1.6* 1.1  PROT 6.5 5.9*  ALBUMIN 1.7* 1.6*   No results for input(s): LIPASE, AMYLASE in the last 168 hours. No results for input(s): AMMONIA in the last 168 hours. Coagulation Profile: No results for input(s): INR, PROTIME in the last 168 hours. Cardiac Enzymes: No results for input(s): CKTOTAL, CKMB, CKMBINDEX, TROPONINI in the last 168 hours. BNP (last 3 results) No results for input(s): PROBNP in the last 8760 hours. HbA1C: Recent Labs    06/11/21 0312  HGBA1C 6.5*   CBG: Recent Labs  Lab 06/10/21 0731 06/11/21 0009 06/11/21 1628 06/12/21 1157 06/12/21 1633  GLUCAP 86 110* 125* 159* 109*   Lipid Profile: Recent Labs    06/11/21 0312  CHOL 101  HDL 17*  LDLCALC 68  TRIG 80  CHOLHDL 5.9   Thyroid Function Tests: No results for input(s): TSH, T4TOTAL, FREET4, T3FREE, THYROIDAB in the last 72 hours. Anemia Panel: No results for input(s): VITAMINB12, FOLATE, FERRITIN, TIBC, IRON, RETICCTPCT in the last 72 hours. Sepsis Labs: Recent Labs  Lab 06/11/21 0312 06/11/21 0658 06/11/21 1003  LATICACIDVEN 1.4 1.1 1.1    Recent Results (from the past 240 hour(s))   SARS CORONAVIRUS 2 (TAT 6-24 HRS) Nasopharyngeal Nasopharyngeal Swab     Status: None   Collection Time: 06/04/21  3:32 PM   Specimen: Nasopharyngeal Swab  Result Value Ref Range Status   SARS Coronavirus 2 NEGATIVE NEGATIVE Final    Comment: (NOTE) SARS-CoV-2 target nucleic acids are NOT DETECTED.  The SARS-CoV-2 RNA is generally detectable in upper and lower respiratory specimens during the acute phase of infection. Negative results  do not preclude SARS-CoV-2 infection, do not rule out co-infections with other pathogens, and should not be used as the sole basis for treatment or other patient management decisions. Negative results must be combined with clinical observations, patient history, and epidemiological information. The expected result is Negative.  Fact Sheet for Patients: SugarRoll.be  Fact Sheet for Healthcare Providers: https://www.woods-mathews.com/  This test is not yet approved or cleared by the [place] FDA and  has been authorized for detection and/or diagnosis of SARS-CoV-2 by FDA under an Emergency Use Authorization (EUA). This EUA will remain  in effect (meaning this test can be used) for the duration of the COVID-19 declaration under Se ction 564(b)(1) of the Act, 21 U.S.C. section 360bbb-3(b)(1), unless the authorization is terminated or revoked sooner.  Performed at [facility], [place] [address] [place], [place] [address]   MRSA Next Gen by PCR, Nasal     Status: None   Collection Time: 06/05/21  8:50 AM   Specimen: Nasal Mucosa; Nasal Swab  Result Value Ref Range Status   MRSA by PCR Next Gen NOT DETECTED NOT DETECTED Final    Comment: (NOTE) The GeneXpert MRSA Assay (FDA approved for NASAL specimens only), is one component of a comprehensive MRSA colonization surveillance program. It is not intended to diagnose MRSA infection nor to guide or monitor treatment for MRSA infections. Test  performance is not FDA approved in patients less than 68 years old. Performed at [facility], [place] [address], [place], [place] [address]   Body fluid culture w Gram Stain     Status: None   Collection Time: 06/05/21 12:27 PM   Specimen: PATH Cytology Pleural fluid  Result Value Ref Range Status   Specimen Description   Final    PLEURAL RIGHT Performed at [facility] [address] [place], [place] [address]    Special Requests   Final    NONE Performed at [facility], [place] [address], [place], [place] [address]    Gram Stain   Final    RARE WBC PRESENT,BOTH PMN AND MONONUCLEAR NO ORGANISMS SEEN    Culture   Final    NO GROWTH 3 DAYS Performed at [facility], [place] [address], [place], [place] [address]    Report Status 06/08/2021 FINAL  Final  Culture, blood (routine x 2)     Status: None (Preliminary result)   Collection Time: 06/11/21  3:12 AM   Specimen: BLOOD  Result Value Ref Range Status   Specimen Description BLOOD LEFT ANTECUBITAL  Final   Special Requests   Final    BOTTLES DRAWN AEROBIC AND ANAEROBIC Blood Culture adequate volume   Culture   Final    NO GROWTH 1 DAY Performed at [facility], [place] [address], [place], [place] [address]    Report Status PENDING  Incomplete  Culture, blood (routine x 2)     Status: None (Preliminary result)   Collection Time: 06/11/21  3:28 AM   Specimen: BLOOD LEFT HAND  Result Value Ref Range Status   Specimen Description BLOOD LEFT HAND  Final   Special Requests   Final    BOTTLES DRAWN AEROBIC AND ANAEROBIC Blood Culture adequate volume   Culture   Final    NO GROWTH 1 DAY Performed at [facility], [place] [address] [place], [place] [address]    Report Status PENDING  Incomplete     Radiology Studies: CT ANGIO  HEAD NECK W WO CM  Result Date: 06/11/2021 CLINICAL DATA:  Stroke, follow up EXAM: CT ANGIOGRAPHY HEAD AND NECK TECHNIQUE: Multidetector  CT imaging of the head and neck was performed using the standard protocol during bolus administration of intravenous contrast. Multiplanar CT image reconstructions and MIPs were obtained to evaluate the vascular anatomy. Carotid stenosis measurements (when applicable) are obtained utilizing NASCET criteria, using the distal internal carotid diameter as the denominator. CONTRAST:  21m OMNIPAQUE IOHEXOL 350 MG/ML SOLN COMPARISON:  None. FINDINGS: CT HEAD FINDINGS Brain: Multiple small acute infarcts in bilateral cerebral hemispheres are better characterized on recent MRI. No evidence of interval acute large vascular territory infarct or acute hemorrhage. No mass lesion, midline shift, hydrocephalus, or extra-axial fluid collection. Patchy white matter hypoattenuation, nonspecific but likely in part related to chronic microvascular ischemic disease. Mild atrophy. Vascular: See below. Skull: No acute fracture. Sinuses: Clear sinuses. Orbits: No acute finding. Review of the MIP images confirms the above findings CTA NECK FINDINGS Aortic arch: Curve vessel origins are patent. Right carotid system: Patent. Mild atherosclerosis of the carotid bifurcation without significant open greater than 50%) stenosis at the bifurcation. Tortuous ICA with moderate stenosis proximally due to abrupt turn/kink. Left carotid system: Atherosclerosis and irregularity at the carotid bifurcation without greater than 50% stenosis relative to the distal vessel. Vertebral arteries: Mildly left dominant. No evidence of dissection, stenosis (50% or greater) or occlusion. Mild atherosclerosis. Skeleton: Reversal of the normal cervical lordosis. Moderate multilevel degenerative disc disease in the upper cervical spine with disc height loss, endplate sclerosis and posterior disc osteophyte complexes. Other neck: There is dilation of the right laryngeal ventricle with medialization of the posterior aspect of the right vocal cord and medialization of  the right arytenoid cartilage, suggestive of right vocal cord paralysis. Upper chest: Right pleural effusion, atelectasis, and right paratracheal and adenopathy, partially imaged and further characterized on recent CT chest from 05/25/2021. Review of the MIP images confirms the above findings CTA HEAD FINDINGS Anterior circulation: Right petrous ICA is patent. Bilateral cavernous and paraclinoid ICA atherosclerosis with severe stenosis versus occlusion of the cavernous right ICA with opacified paraclinoid ICA. Patent right MCA with out high-grade proximal stenosis. Left intracranial ICA is patent with multifocal moderate stenosis. Left MCA is patent without proximal high-grade stenosis. Right A1 ACA is small, probably congenital given prominent left A1 ACA. Otherwise common bilateral ACAs are patent without proximal high-grade stenosis. No aneurysm identified. Posterior circulation: Bilateral intradural vertebral arteries, basilar artery, and posterior cerebral arteries are patent without proximal high-grade stenosis. Mild proximal right P2 PCA stenosis. No aneurysm identified. Venous sinuses: As permitted by contrast timing, patent. Small left transverse sinus and distal transverse sinus arachnoid granulation Review of the MIP images confirms the above findings IMPRESSION: CT head: Multiple small acute infarcts in bilateral cerebral hemispheres are better characterized on recent MRI. No evidence of interval acute large vascular territory infarct or acute hemorrhage. CTA Head: 1. Severe stenosis versus occlusion of the cavernous right ICA with opacified moderately narrowed paraclinoid right ICA. Patent right MCA. 2. Multifocal moderate left intracranial ICA stenosis. 3. Mild right P2 PCA stenosis. CTA Neck: 1. Tortuous right ICA with moderate stenosis proximally due to abrupt turn/kink. 2. Otherwise, no significant (greater than 50%) stenosis in the neck. 3. 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 Recommend correlation with the presence or absence of hoarseness. 4. Right pleural effusion, atelectasis, and right paratracheal and adenopathy, partially imaged and further  characterized on recent CT chest from 05/25/2021. Electronically Signed   By: Margaretha Sheffield M.D.   On: 06/11/2021 11:32   DG CHEST PORT 1 VIEW  Result Date: 06/12/2021 CLINICAL DATA:  Shortness of breath. EXAM: PORTABLE CHEST 1 VIEW COMPARISON:  June 11, 2021 FINDINGS: Mediastinal contour and cardiac silhouette are stable. Heart size is enlarged. Dense consolidation of the right mid and lung base are identified without change. Increased pulmonary interstitium is identified bilaterally. The bony structures are stable. IMPRESSION: Congestive heart failure. Persistent consolidation of the right lung base with right pleural effusion unchanged. Electronically Signed   By: Abelardo Diesel M.D.   On: 06/12/2021 13:49   DG CHEST PORT 1 VIEW  Result Date: 06/11/2021 CLINICAL DATA:  Dyspnea, acute on chronic respiratory failure EXAM: PORTABLE CHEST 1 VIEW COMPARISON:  06/07/2021 FINDINGS: There is dense consolidation of the a right lung base, similar to prior examination and corresponding to the known mass in this region better seen on CT examination of 05/25/2021. Left lung is clear. No pneumothorax. Small right pleural effusion is unchanged. Cardiac size is within normal limits. Pulmonary vascularity is normal. IMPRESSION: Stable right basilar consolidation corresponding to known right basilar mass and associated small right pleural effusion. Electronically Signed   By: Fidela Salisbury M.D.   On: 06/11/2021 01:47    Scheduled Meds:  amLODipine  10 mg Oral Daily   apixaban  5 mg Oral BID   aspirin EC  81 mg Oral Daily   feeding supplement  237 mL Oral BID BM   insulin aspart  0-15 Units Subcutaneous TID WC   insulin aspart  0-5 Units Subcutaneous QHS   mouth rinse  15 mL Mouth Rinse BID    mometasone-formoterol  2 puff Inhalation BID   multivitamin with minerals  1 tablet Oral Daily   pantoprazole  40 mg Oral Daily   simvastatin  10 mg Oral Daily   umeclidinium bromide  1 puff Inhalation Daily   Continuous Infusions:  cefTRIAXone (ROCEPHIN)  IV 1 g (06/12/21 1525)   metronidazole 500 mg (06/12/21 1627)     LOS: 8 days   Marylu Lund, MD [facility] Pager On Amion  If 7PM-7AM, please contact night-coverage 06/12/2021, 4:36 PM

## 2021-06-12 NOTE — Progress Notes (Signed)
Physical Therapy Treatment Patient Details Name: Kevin Hobbs MRN: [id number] DOB: 1941/07/08 Today's Date: 06/12/2021    History of Present Illness This 80 years old male with PMH significant for recently diagnosed acute pulmonary embolism, DVT,  pleural effusion and right middle lobe lung mass on ,  type 2 diabetes presented in the ED 06/04/21 with  acute hypoxic respiratory failure. On 2 L home O2.    PT Comments    Pt received in bed with c/o congestion and productive cough. Agreeable to participation in therapy. He required min assist bed mobility, min guard assist transfers, and min guard assist ambulation 30' + 15' with RW. Seated rest break between gait trials. Mobilized on 4L with SpO2 in 90s. 3/4 DOE. HR in 120s during activity. Pt in recliner with feet elevated at end of session.     Follow Up Recommendations  Home health PT;Supervision/Assistance - 24 hour     Equipment Recommendations  None recommended by PT    Recommendations for Other Services       Precautions / Restrictions Precautions Precautions: Fall;Other (comment) Precaution Comments: monitor sats    Mobility  Bed Mobility Overal bed mobility: Needs Assistance Bed Mobility: Supine to Sit     Supine to sit: HOB elevated;Min assist     General bed mobility comments: +rail, increased time, use of bed pad to scoot to EOB    Transfers Overall transfer level: Needs assistance Equipment used: Rolling walker (2 wheeled) Transfers: Sit to/from Stand Sit to Stand: Min guard         General transfer comment: cues for hand placement and sequencing  Ambulation/Gait Ambulation/Gait assistance: Min guard Gait Distance (Feet): 30 Feet (+ 15) Assistive device: Rolling walker (2 wheeled) Gait Pattern/deviations: Step-through pattern;Decreased stride length;Trunk flexed Gait velocity: decreased Gait velocity interpretation: <1.31 ft/sec, indicative of household ambulator General Gait Details: Mobized on  4L with SpO2 in 90s. 3/4 DOE. HR sustained in 120s during activity. Pt's daughter followed with recliner. Seated rest break between gait trials, 30' then 15'. Distance limited by fatigue.   Stairs             Wheelchair Mobility    Modified Rankin (Stroke Patients Only)       Balance Overall balance assessment: Needs assistance Sitting-balance support: Feet supported;No upper extremity supported Sitting balance-Leahy Scale: Good     Standing balance support: Bilateral upper extremity supported;During functional activity Standing balance-Leahy Scale: Poor Standing balance comment: reliant on BUE support                            Cognition Arousal/Alertness: Awake/alert Behavior During Therapy: WFL for tasks assessed/performed Overall Cognitive Status: Within Functional Limits for tasks assessed                                        Exercises      General Comments General comments (skin integrity, edema, etc.): SpO2 in 90s on 4L during activity. 3L at rest with SpO2 94%. HR in 120s during activity. HR 111 in recliner at end of session.      Pertinent Vitals/Pain Pain Assessment: No/denies pain    Home Living                      Prior Function  PT Goals (current goals can now be found in the care plan section) Acute Rehab PT Goals Patient Stated Goal: get stronger Progress towards PT goals: Progressing toward goals    Frequency    Min 3X/week      PT Plan Current plan remains appropriate    Co-evaluation              AM-PAC PT "6 Clicks" Mobility   Outcome Measure  Help needed turning from your back to your side while in a flat bed without using bedrails?: A Little Help needed moving from lying on your back to sitting on the side of a flat bed without using bedrails?: A Little Help needed moving to and from a bed to a chair (including a wheelchair)?: A Little Help needed standing up from a  chair using your arms (e.g., wheelchair or bedside chair)?: A Little Help needed to walk in hospital room?: A Little Help needed climbing 3-5 steps with a railing? : A Lot 6 Click Score: 17    End of Session Equipment Utilized During Treatment: Gait belt;Oxygen Activity Tolerance: Patient limited by fatigue Patient left: in chair;with call bell/phone within reach;with family/visitor present Nurse Communication: Mobility status PT Visit Diagnosis: Difficulty in walking, not elsewhere classified (R26.2)     Time: 4707-6151 PT Time Calculation (min) (ACUTE ONLY): 24 min  Charges:  $Gait Training: 23-37 mins                     Lorrin Goodell, PT  Office # [phone] Pager [phone]    [name] 06/12/2021, 12:51 PM

## 2021-06-12 NOTE — Discharge Instructions (Signed)

## 2021-06-12 NOTE — Consult Note (Signed)
Consultation Note Date: 06/12/2021   Patient Name: Kevin Hobbs  DOB: [birth year]  MRN: [id number]  Age / Sex: 80 y.o., male  PCP: Kevin Peers, MD Referring Physician: Donne Hazel, MD  Reason for Consultation: Establishing goals of care "Recently diagnosed metastatic lung cancer"  HPI/Patient Profile: 80 y.o. male  with past medical history of recently diagnosed acute pulmonary embolism, right pleural effusion, right middle lobe lung mass, recently diagnosed acute left lower extremity DVT, and type 2 diabetes. He was admitted to [facility] hospital on 06/04/2021 with acute on chronic hypoxic respiratory failure after [facility] complaining of shortness of breath.   Patient was recently hospitalized 05/25/21 - 05/27/21 at [facility] for acute hypoxic respiratory failure. Work-up included CTA chest which showed evidence of acute pulmonary embolism, right pleural effusion, and right middle lobe lung mass (which was reportedly new). 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   PET scan was done 06/03/21 and showed evidence of metastatic disease.   Clinical Assessment and Goals of Care: I have reviewed medical records including EPIC notes, labs and imaging, and met at bedside with patient and his wife Kevin Hobbs to discuss diagnosis, prognosis, GOC, EOL wishes, disposition, and options. Kevin Hobbs reports having a frequent cough that causes discomfort in his chest. He also reports poor appetite and difficulty sleeping.  I introduced Palliative Medicine as specialized medical care for people living with serious illness. It focuses on providing relief from the symptoms and stress of a serious illness.   We discussed a brief life review of the patient. Kevin Hobbs describes himself as "Kevin Hobbs born and bred". He and Kevin Hobbs  have been married for 44 years. They have 1 daughter, Kevin Hobbs. 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 Since retirement, he has enjoyed Artist.   [name] and [place] live in their home in [place]. Daughter Kevin Hobbs lives with them. As far as functional status, Kevin Hobbs states "he was fine" prior to 05/25/21. They both feel this diagnosis "snuck up on him".  In his current condition, he is unable to tolerate walking upstairs and Kevin Hobbs has needed to transition his bedroom from the second to the first floor. Regarding nutritional status, he does report a 45 lb weight loss over the past year.   We discussed his current medical situation. Eldor and Kevin Hobbs understand that he has advanced lung cancer with evidence of metastasis. Natural disease trajectory of advanced cancer was discussed.They understand this is a non-curable illness.   Discussed that per oncology, the plan is for molecular testing and PD-L1 on the original cytology obtained 8/24. Once all results are available, he will follow-up with oncology outpatient to discuss further treatment options.   I attempted to elicit values and goals of care important to the patient. He becomes tearful as he states "I want to live". Kevin Hobbs reassures him that she and Kevin Hobbs will be there for him "every step of the way". She states they  are going to "take things one day at a time".   We did discuss code status. Encouraged patient to consider DNR/DNI status understanding evidenced based poor outcomes in similar hospitalized patients, as the cause of the arrest is likely associated with chronic/terminal disease rather than a reversible acute cardio-pulmonary event. I explained that DNR/DNI does not change the medical plan and it only comes into effect after a person has arrested (died).  It is a protective measure to keep [place] from harming the patient in their last moments of life. Theodoro Doing and [place] request time to discuss  further among themselves prior to making a decision.   Hospice and Palliative Care services outpatient were explained and offered. For now, I would recommend outpatient palliative to check in with patient and family and continue Kevin Hobbs discussions. Patient and wife are agreeable to outpatient palliative referral. Briefly discussed that he would be eligible for hospice services if he decides to stop (or not start) cancer treatment or if there comes a point when his body is unable to tolerate further treatment.    Discussed the importance of continued conversation with patient, family, and the medical team regarding overall plan of care and treatment options, ensuring decisions are within the context of the patients values and GOCs.  Primary decision maker: Patient    SUMMARY OF RECOMMENDATIONS   Full code full scope Plan for follow-up discussion regarding code status Recommend outpatient palliative care at discharge PMT will continue to follow  Code Status/Advance Care Planning: Full code   Symptom Management:  Start mirtazapine 7.5 mg at bedtime (for sleep and appetite) Continue chlorpheniramine-hydrocodone (Tussionex) 10-8 mg/5 ml every 12 hours prn for cough  Palliative Prophylaxis:  Frequent Pain Assessment and Oral Care  Additional Recommendations (Limitations, Scope, Preferences): Full Scope Treatment  Psycho-social/Spiritual:  Created space and opportunity for patient and family to express thoughts and feelings regarding patient's current medical situation.  Emotional support provided   Prognosis:  Unable to determine  Discharge Planning: Home with Palliative Services      Primary Diagnoses: Present on Admission:  Pleural effusion  SOB (shortness of breath)  Pulmonary embolism (HCC)  Essential hypertension  Hyperkalemia  GERD (gastroesophageal reflux disease)   I have reviewed the medical record, interviewed the patient and family, and examined the patient. The  following aspects are pertinent.  Past Medical History:  Diagnosis Date   Acute DVT (deep venous thrombosis) (HCC)    LLE; Aug '22   Acute pulmonary embolism [facility])    Aug '22   Diabetes mellitus without complication ([place])    Hypertension    Pleural effusion    right-sided     History reviewed. No pertinent family history. Scheduled Meds:  amLODipine  10 mg Oral Daily   apixaban  5 mg Oral BID   aspirin EC  81 mg Oral Daily   feeding supplement  237 mL Oral BID BM   insulin aspart  0-15 Units Subcutaneous TID WC   insulin aspart  0-5 Units Subcutaneous QHS   mouth rinse  15 mL Mouth Rinse BID   mometasone-formoterol  2 puff Inhalation BID   multivitamin with minerals  1 tablet Oral Daily   pantoprazole  40 mg Oral Daily   simvastatin  10 mg Oral Daily   umeclidinium bromide  1 puff Inhalation Daily   Continuous Infusions:  cefTRIAXone (ROCEPHIN)  IV 1 g (06/12/21 1525)   metronidazole 500 mg (06/12/21 0312)   PRN Meds:.acetaminophen **OR** acetaminophen, albuterol, ALPRAZolam, chlorpheniramine-HYDROcodone, guaiFENesin-dextromethorphan,  traZODone Medications Prior to Admission:  Prior to Admission medications   Medication Sig Start Date End Date Taking? Authorizing Provider  acetaminophen (TYLENOL) 325 MG tablet Take 2 tablets (650 mg total) by mouth every 6 (six) hours as needed for mild pain (or Fever >/= 101). 05/27/21  Yes Sheikh, Omair Latif, DO  amLODipine (NORVASC) 10 MG tablet Take 10 mg by mouth daily. 05/04/21  Yes [provider]  apixaban (ELIQUIS) 5 MG TABS tablet Take 2 tablets (10 mg total) by mouth 2 (two) times daily for 7 days, THEN 1 tablet (5 mg total) 2 (two) times daily for 28 days. 05/27/21 07/01/21 Yes Sheikh, Omair Latif, DO  budesonide-formoterol (SYMBICORT) 80-4.5 MCG/ACT inhaler Inhale 2 puffs into the lungs in the morning and at bedtime. 05/27/21  Yes Sheikh, Omair Latif, DO  clarithromycin (BIAXIN) 500 MG tablet Take 500 mg by mouth 2 (two)  times daily. Start date : 05/24/21   Yes [provider]  losartan (COZAAR) 100 MG tablet Take 100 mg by mouth daily. 03/29/21  Yes [provider]  metFORMIN (GLUCOPHAGE-XR) 500 MG 24 hr tablet Take 1,000 mg by mouth daily. 02/28/21  Yes [provider]  pantoprazole (PROTONIX) 40 MG tablet Take 40 mg by mouth daily. 05/04/21  Yes [provider]  simvastatin (ZOCOR) 10 MG tablet Take 10 mg by mouth daily. 05/04/21  Yes [provider]  sodium chloride (OCEAN) 0.65 % nasal spray Place 1 spray into the nose at bedtime.   Yes [provider]  tiotropium (SPIRIVA HANDIHALER) 18 MCG inhalation capsule Place 1 capsule (18 mcg total) into inhaler and inhale daily. 05/27/21  Yes Sheikh, Omair Latif, DO  amoxicillin (AMOXIL) 500 MG tablet Take 1,000 mg by mouth 2 (two) times daily. 05/24/21   [provider]  escitalopram (LEXAPRO) 5 MG tablet Take 5 mg by mouth daily. 06/03/21   [provider]  feeding supplement (ENSURE ENLIVE / ENSURE PLUS) LIQD Take 237 mLs by mouth 2 (two) times daily between meals. 06/09/21 07/09/21  Arrien, Jimmy Picket, MD  ondansetron (ZOFRAN) 4 MG tablet Take 1 tablet (4 mg total) by mouth every 6 (six) hours as needed for nausea. 05/27/21   Raiford Noble Latif, DO   No Known Allergies Review of Systems  Constitutional:  Positive for appetite change.  Respiratory:  Positive for cough and shortness of breath.   Psychiatric/Behavioral:  Positive for sleep disturbance.    Physical Exam Vitals reviewed.  Constitutional:      General: He is not in acute distress.    Appearance: He is ill-appearing.  Cardiovascular:     Rate and Rhythm: Normal rate.  Pulmonary:     Effort: Pulmonary effort is normal.  Neurological:     Mental Status: He is alert and oriented to person, place, and time.  Psychiatric:        Behavior: Behavior normal.    Vital Signs: BP 109/60 (BP Location: Left Arm)   Pulse (!) 110   Temp  99.5 F (37.5 C) (Oral)   Resp (!) 28   Ht 5' 7"  (1.702 m)   Wt 105.8 kg   SpO2 94%   BMI 36.53 kg/m  Pain Scale: 0-10   Pain Score: 0-No pain   SpO2: SpO2: 94 % O2 Device:SpO2: 94 % O2 Flow Rate: .O2 Flow Rate (L/min): 3 L/min  IO: Intake/output summary:  Intake/Output Summary (Last 24 hours) at 06/12/2021 1544 Last data filed at 06/12/2021 1527 Gross per 24 hour  Intake 680 ml  Output 2600 ml  Net -1920 ml    LBM: Last BM Date: 06/11/21 Baseline Weight: Weight: 107 kg Most recent weight: Weight: 105.8 kg      Palliative Assessment/Data: PPS 40%     Time In: 1630 Time Out: 1742 Time Total: 72 minutes Greater than 50%  of this time was spent counseling and coordinating care related to the above assessment and plan.  Signed by: Lavena Bullion, NP   Please contact Palliative Medicine Team phone at [phone] for questions and concerns.  For individual provider: See Shea Evans

## 2021-06-13 LAB — COMPREHENSIVE METABOLIC PANEL
ALT: 52 U/L — ABNORMAL HIGH (ref 0–44)
AST: 42 U/L — ABNORMAL HIGH (ref 15–41)
Albumin: 1.5 g/dL — ABNORMAL LOW (ref 3.5–5.0)
Alkaline Phosphatase: 102 U/L (ref 38–126)
Anion gap: 10 (ref 5–15)
BUN: 40 mg/dL — ABNORMAL HIGH (ref 8–23)
CO2: 21 mmol/L — ABNORMAL LOW (ref 22–32)
Calcium: 8.5 mg/dL — ABNORMAL LOW (ref 8.9–10.3)
Chloride: 109 mmol/L (ref 98–111)
Creatinine, Ser: 1.61 mg/dL — ABNORMAL HIGH (ref 0.61–1.24)
GFR, Estimated: 44 mL/min — ABNORMAL LOW (ref >60)
Glucose, Bld: 103 mg/dL — ABNORMAL HIGH (ref 70–99)
Potassium: 4.3 mmol/L (ref 3.5–5.1)
Sodium: 140 mmol/L (ref 135–145)
Total Bilirubin: 1 mg/dL (ref 0.3–1.2)
Total Protein: 6.1 g/dL — ABNORMAL LOW (ref 6.5–8.1)

## 2021-06-13 LAB — CBC
HCT: 28.8 % — ABNORMAL LOW (ref 39.0–52.0)
Hemoglobin: 9.3 g/dL — ABNORMAL LOW (ref 13.0–17.0)
MCH: 27.9 pg (ref 26.0–34.0)
MCHC: 32.3 g/dL (ref 30.0–36.0)
MCV: 86.5 fL (ref 80.0–100.0)
Platelets: 498 10*3/uL — ABNORMAL HIGH (ref 150–400)
RBC: 3.33 MIL/uL — ABNORMAL LOW (ref 4.22–5.81)
RDW: 17.1 % — ABNORMAL HIGH (ref 11.5–15.5)
WBC: 16.8 10*3/uL — ABNORMAL HIGH (ref 4.0–10.5)
nRBC: 0 % (ref 0.0–0.2)

## 2021-06-13 LAB — GLUCOSE, CAPILLARY
Glucose-Capillary: 120 mg/dL — ABNORMAL HIGH (ref 70–99)
Glucose-Capillary: 180 mg/dL — ABNORMAL HIGH (ref 70–99)
Glucose-Capillary: 87 mg/dL (ref 70–99)
Glucose-Capillary: 101 mg/dL — ABNORMAL HIGH (ref 70–99)

## 2021-06-13 LAB — MAGNESIUM: Magnesium: 1.7 mg/dL (ref 1.7–2.4)

## 2021-06-13 MED ORDER — ALBUTEROL SULFATE (2.5 MG/3ML) 0.083% IN NEBU
2.5000 mg | INHALATION_SOLUTION | Freq: Two times a day (BID) | RESPIRATORY_TRACT | Status: DC
  Filled 2021-06-13: qty 3

## 2021-06-13 NOTE — Plan of Care (Signed)
  Problem: Education: Goal: Knowledge of General Education information will improve Description: Including pain rating scale, medication(s)/side effects and non-pharmacologic comfort measures 06/13/2021 2023 by Cassell Smiles, RN Outcome: Progressing 06/13/2021 1920 by Cassell Smiles, RN Outcome: Progressing   Problem: Health Behavior/Discharge Planning: Goal: Ability to manage health-related needs will improve 06/13/2021 2023 by Cassell Smiles, RN Outcome: Progressing 06/13/2021 1920 by Cassell Smiles, RN Outcome: Progressing   Problem: Clinical Measurements: Goal: Ability to maintain clinical measurements within normal limits will improve 06/13/2021 2023 by Cassell Smiles, RN Outcome: Progressing 06/13/2021 1920 by Cassell Smiles, RN Outcome: Progressing Goal: Diagnostic test results will improve 06/13/2021 2023 by Cassell Smiles, RN Outcome: Progressing 06/13/2021 1920 by Cassell Smiles, RN Outcome: Progressing Goal: Respiratory complications will improve 06/13/2021 2023 by Cassell Smiles, RN Outcome: Progressing 06/13/2021 1920 by Cassell Smiles, RN Outcome: Progressing   Problem: Nutrition: Goal: Adequate nutrition will be maintained 06/13/2021 2023 by Cassell Smiles, RN Outcome: Progressing 06/13/2021 1920 by Cassell Smiles, RN Outcome: Progressing   Problem: Safety: Goal: Ability to remain free from injury will improve 06/13/2021 2023 by Cassell Smiles, RN Outcome: Progressing 06/13/2021 1920 by Cassell Smiles, RN Outcome: Progressing

## 2021-06-13 NOTE — Plan of Care (Signed)
  Problem: Education: Goal: Knowledge of General Education information will improve Description: Including pain rating scale, medication(s)/side effects and non-pharmacologic comfort measures Outcome: Progressing   Problem: Health Behavior/Discharge Planning: Goal: Ability to manage health-related needs will improve Outcome: Progressing   Problem: Clinical Measurements: Goal: Ability to maintain clinical measurements within normal limits will improve Outcome: Progressing Goal: Diagnostic test results will improve Outcome: Progressing Goal: Respiratory complications will improve Outcome: Progressing   Problem: Nutrition: Goal: Adequate nutrition will be maintained Outcome: Progressing   Problem: Safety: Goal: Ability to remain free from injury will improve Outcome: Progressing

## 2021-06-13 NOTE — Progress Notes (Signed)
PROGRESS NOTE    Kevin Hobbs  WCB:[id number] DOB: [birth year] DOA: 06/04/2021 PCP: Robyne Peers, MD    Brief Narrative:  80 year old male past medical history for pulmonary embolism, acute lower extremity DVT, non-small cell lung cancer, and type 2 diabetes mellitus, who presented with recurrent dyspnea.  He had a recent hospitalization 8/23-8/25/2022 when he was diagnosed with pulmonary embolism, right pleural effusion and right middle lobe lung mass.  Underwent thoracentesis and 1.3 L of fluid was removed.  At home he had progressive recurrent dyspnea associated with cough and orthopnea.   Because of persistent and worsening symptoms he came back to the hospital.  On his initial physical examination he was afebrile, heart rate 78, blood pressure 117/58, respiratory rate 22-28, oxygen saturation 90% on 2 L of supplemental oxygen per nasal cannula.  His lungs had decreased breath sounds at the right side, no wheezing or rhonchi, heart S1-S2, present, rhythmic, soft abdomen, no lower extremity edema.   Sodium 136, potassium 5.3, chloride 107, bicarb 20, glucose 99, BUN 61, creatinine 1.49, AST 164, ALT 91, white count 12.1, hemoglobin 9.5, hematocrit 39.6, platelets 602 SARS COVID-19 negative.   Chest radiograph with right pleural effusion, right lower/ middle  lobe opacity.   EKG 88 bpm, normal axis, normal intervals, Q-wave V1, normal sinus rhythm, no significant ST segment or T wave changes.   Patient was admitted to the medical ward, he was placed on supplemental oxygen per nasal cannula. Underwent thoracentesis 22 cc of amber fluid was obtained. Received antibiotic therapy for possible post-obstructive pneumonia with improvement of his symptoms.   Brain MRI was performed as part of lung cancer staging finding new CVA bilateral. Neurology was consulted with recommendations for transfer to [facility] for further neurologic workup.     Assessment & Plan:   Principal Problem:   Acute on  chronic respiratory failure with hypoxemia (HCC) Active Problems:   Pulmonary embolism (HCC)   Pleural effusion   SOB (shortness of breath)   Diabetes mellitus without complication (HCC)   Essential hypertension   Hyperkalemia   GERD (gastroesophageal reflux disease)   Cerebrovascular accident (CVA) ([place])    Acute on chronic hypoxemic respiratory failure/postobstructive pneumonia/fever.  Swallow dysfunction. COPD exacerbation.  He received treatment with 5 days of azithromycin and prednisone along with albuterol as needed.  Speech therapy recommended a dysphagia 3 diet. His oxymetry at discharge was 94% on 2 L/min per [place]  -Patient continues with increased cough, recently noted to have a fever of 102.1.  Chest x-ray on 06/11/2021 notable for stable right basilar consolidation, right basilar mass and associated small right pleural effusion.   -Given concerns of developed pneumonia, patient was started on IV ceftriaxone, Flagyl.   -Recently noted to have increased cough and shortness of breath -Repeat chest x-ray with findings suggestive of pulmonary edema in the setting of concurrent IV fluid hydration. -Have held further IV fluids, improvement noted following trial of 40mg  IV lasix -Currently appears to be breathing more comfortably   2.  Right middle lobe carcinoma, malignant right pleural effusion/ incidental new CVA (bilateral) Brain MRI done for screening showed multiple scattered subcentimeter foci of diffusion abnormality involving the periventricular, deep and subcortical white matter of the cerebral hemispheres, consistent with acute to early subacute ischemic infarcts.  No evidence of intracranial metastatic disease. Few scattered chronic microhemorrhages involving the soft tentorial brain.   Echocardiogram from 08/22 had no intracavitary thrombus.   Consultation with neurology Dr Cheral Marker has recommended patient to be  transfer to [facility] for stroke team evaluation. No evidence of  intracardiac shunt.  Followed by neurology.  Continue anticoagulation with apixaban, statins.  Patient has been evaluated be physical therapy with recommendations for home health PT.  Seems to be stable neurologically at this time -Appreciate input by Palliative Care. Goals of Care discussions continue. At this point, pt's wishes are Full Code   3.  Acute pulmonary embolism.  Tolerating anticoagulation with apixaban.   4.  Acute kidney injury, hyperkalemia.  Recently given IV Lasix with subsequent creatinine in excess of 1.6.  Losartan currently remains on hold.  Patient was given basal IV fluids overnight.   -Renal function has improved somewhat -Given worsening respiratory status with chest x-ray findings, further IV fluids were held and patient is now status post Lasix x1 with improvement in sob -recheck bmet in AM   5.  Type 2 diabetes mellitus/ dyslipidemia.  -Metformin on hold -Continue sliding scale insulin as needed   6.  Hypertension.  Had been on losartan and amlodipine for blood pressure control.  Given concerns of acute renal failure, have held ARB.Marland Kitchen  Continue amlodipine.as tolerated     7.  GERD.  PPI   8. Depression.  -Patient was prescribed escitalopram prior to admit and reportedly had not taken yet, currently on hold  Status is: Inpatient   DVT prophylaxis: Eliquis Code Status: Full Family Communication: Pt in room, family at bedside  Status is: Inpatient  Remains inpatient appropriate because:Inpatient level of care appropriate due to severity of illness  Dispo: The patient is from: Home              Anticipated d/c is to: Home              Patient currently is not medically stable to d/c.   Difficult to place patient No    Consultants:  Neurology Oncology Critical care  Procedures:  Right thoracentesis   Antimicrobials: Anti-infectives (From admission, onward)    Start     Dose/Rate Route Frequency Ordered Stop   06/11/21 1445  cefTRIAXone  (ROCEPHIN) 1 g in sodium chloride 0.9 % 100 mL IVPB        1 g 200 mL/hr over 30 Minutes Intravenous Every 24 hours 06/11/21 1353     06/11/21 1445  metroNIDAZOLE (FLAGYL) IVPB 500 mg        500 mg 100 mL/hr over 60 Minutes Intravenous Every 12 hours 06/11/21 1354 06/14/21 1444   06/08/21 1000  azithromycin (ZITHROMAX) tablet 500 mg        500 mg Oral Daily 06/07/21 0905 06/09/21 1058   06/05/21 0300  azithromycin (ZITHROMAX) 500 mg in sodium chloride 0.9 % 250 mL IVPB  Status:  Discontinued        500 mg 250 mL/hr over 60 Minutes Intravenous Every 24 hours 06/05/21 0231 06/07/21 0905       Subjective: States breathing is better today  Objective: Vitals:   06/13/21 0836 06/13/21 1000 06/13/21 1224 06/13/21 1513  BP: 118/68 114/62 132/73 137/73  Pulse: (!) 109 (!) 104 (!) 104 99  Resp: (!) 27 (!) 21 (!) 30 (!) 25  Temp: (!) 100.4 F (38 C) 99.9 F (37.7 C) 98.9 F (37.2 C) 97.9 F (36.6 C)  TempSrc: Oral Oral Oral Oral  SpO2: 96%  95% 95%  Weight:      Height:        Intake/Output Summary (Last 24 hours) at 06/13/2021 1545 Last data filed at 06/13/2021 [phone]  Gross per 24 hour  Intake --  Output 950 ml  Net -950 ml    Filed Weights   06/10/21 0500 06/12/21 0500 06/13/21 0500  Weight: 105.7 kg 105.8 kg 107.1 kg    Examination: General exam: Conversant, in no acute distress Respiratory system: normal chest rise, clear, no audible wheezing Cardiovascular system: regular rhythm, s1-s2 Gastrointestinal system: Nondistended, nontender, pos BS Central nervous system: No seizures, no tremors Extremities: No cyanosis, no joint deformities Skin: No rashes, no pallor Psychiatry: Affect normal // no auditory hallucinations   Data Reviewed: I have personally reviewed following labs and imaging studies  CBC: Recent Labs  Lab 06/07/21 0236 06/09/21 0438 06/11/21 0312 06/12/21 0314 06/13/21 0238  WBC 15.4* 17.9* 18.6* 17.5* 16.8*  HGB 9.8* 10.6* 10.2* 9.2* 9.3*  HCT  30.3* 32.6* 31.3* 28.8* 28.8*  MCV 86.3 84.9 85.3 85.7 86.5  PLT 634* 597* 560* 467* 498*    Basic Metabolic Panel: Recent Labs  Lab 06/08/21 0413 06/09/21 0438 06/11/21 0312 06/12/21 0314 06/13/21 0238  NA 145 144 138 137 140  K 4.4 4.4 4.1 4.3 4.3  CL 115* 113* 106 108 109  CO2 21* 22 19* 19* 21*  GLUCOSE 105* 98 128* 129* 103*  BUN 37* 28* 38* 39* 40*  CREATININE 1.05 0.99 1.60* 1.51* 1.61*  CALCIUM 9.1 9.3 9.0 8.3* 8.5*  MG 1.8 1.8  --   --  1.7  PHOS 3.3 3.1  --   --   --     GFR: Estimated Creatinine Clearance: 44.1 mL/min (A) (by C-G formula based on SCr of 1.61 mg/dL (H)). Liver Function Tests: Recent Labs  Lab 06/11/21 0312 06/12/21 0314 06/13/21 0238  AST 106* 48* 42*  ALT 101* 67* 52*  ALKPHOS 150* 114 102  BILITOT 1.6* 1.1 1.0  PROT 6.5 5.9* 6.1*  ALBUMIN 1.7* 1.6* 1.5*    No results for input(s): LIPASE, AMYLASE in the last 168 hours. No results for input(s): AMMONIA in the last 168 hours. Coagulation Profile: No results for input(s): INR, PROTIME in the last 168 hours. Cardiac Enzymes: No results for input(s): CKTOTAL, CKMB, CKMBINDEX, TROPONINI in the last 168 hours. BNP (last 3 results) No results for input(s): PROBNP in the last 8760 hours. HbA1C: Recent Labs    06/11/21 0312  HGBA1C 6.5*    CBG: Recent Labs  Lab 06/12/21 1157 06/12/21 1633 06/12/21 2125 06/13/21 0656 06/13/21 1228  GLUCAP 159* 109* 155* 120* 180*    Lipid Profile: Recent Labs    06/11/21 0312  CHOL 101  HDL 17*  LDLCALC 68  TRIG 80  CHOLHDL 5.9    Thyroid Function Tests: No results for input(s): TSH, T4TOTAL, FREET4, T3FREE, THYROIDAB in the last 72 hours. Anemia Panel: No results for input(s): VITAMINB12, FOLATE, FERRITIN, TIBC, IRON, RETICCTPCT in the last 72 hours. Sepsis Labs: Recent Labs  Lab 06/11/21 0312 06/11/21 0658 06/11/21 1003  LATICACIDVEN 1.4 1.1 1.1     Recent Results (from the past 240 hour(s))  SARS CORONAVIRUS 2 (TAT 6-24  HRS) Nasopharyngeal Nasopharyngeal Swab     Status: None   Collection Time: 06/04/21  3:32 PM   Specimen: Nasopharyngeal Swab  Result Value Ref Range Status   SARS Coronavirus 2 NEGATIVE NEGATIVE Final    Comment: (NOTE) SARS-CoV-2 target nucleic acids are NOT DETECTED.  The SARS-CoV-2 RNA is generally detectable in upper and lower respiratory specimens during the acute phase of infection. Negative results do not preclude SARS-CoV-2 infection, do not rule out  co-infections with other pathogens, and should not be used as the sole basis for treatment or other patient management decisions. Negative results must be combined with clinical observations, patient history, and epidemiological information. The expected result is Negative.  Fact Sheet for Patients: SugarRoll.be  Fact Sheet for Healthcare Providers: https://www.woods-mathews.com/  This test is not yet approved or cleared by the [place] FDA and  has been authorized for detection and/or diagnosis of SARS-CoV-2 by FDA under an Emergency Use Authorization (EUA). This EUA will remain  in effect (meaning this test can be used) for the duration of the COVID-19 declaration under Se ction 564(b)(1) of the Act, 21 U.S.C. section 360bbb-3(b)(1), unless the authorization is terminated or revoked sooner.  Performed at [facility], [place] [address], [place], [place] [address]   MRSA Next Gen by PCR, Nasal     Status: None   Collection Time: 06/05/21  8:50 AM   Specimen: Nasal Mucosa; Nasal Swab  Result Value Ref Range Status   MRSA by PCR Next Gen NOT DETECTED NOT DETECTED Final    Comment: (NOTE) The GeneXpert MRSA Assay (FDA approved for NASAL specimens only), is one component of a comprehensive MRSA colonization surveillance program. It is not intended to diagnose MRSA infection nor to guide or monitor treatment for MRSA infections. Test performance is not FDA approved in  patients less than 35 years old. Performed at [facility], [place] [address], [place], [place] [address]   Body fluid culture w Gram Stain     Status: None   Collection Time: 06/05/21 12:27 PM   Specimen: PATH Cytology Pleural fluid  Result Value Ref Range Status   Specimen Description   Final    PLEURAL RIGHT Performed at [place] [address], [place], [place] [address]    Special Requests   Final    NONE Performed at [facility], [place] [address] [place], [place] [address]    Gram Stain   Final    RARE WBC PRESENT,BOTH PMN AND MONONUCLEAR NO ORGANISMS SEEN    Culture   Final    NO GROWTH 3 DAYS Performed at [facility], [place] [address], [place], [place] [address]    Report Status 06/08/2021 FINAL  Final  Culture, blood (routine x 2)     Status: None (Preliminary result)   Collection Time: 06/11/21  3:12 AM   Specimen: BLOOD  Result Value Ref Range Status   Specimen Description BLOOD LEFT ANTECUBITAL  Final   Special Requests   Final    BOTTLES DRAWN AEROBIC AND ANAEROBIC Blood Culture adequate volume   Culture   Final    NO GROWTH 1 DAY Performed at [facility], [place] [address] [place], [place] [address]    Report Status PENDING  Incomplete  Culture, blood (routine x 2)     Status: None (Preliminary result)   Collection Time: 06/11/21  3:28 AM   Specimen: BLOOD LEFT HAND  Result Value Ref Range Status   Specimen Description BLOOD LEFT HAND  Final   Special Requests   Final    BOTTLES DRAWN AEROBIC AND ANAEROBIC Blood Culture adequate volume   Culture   Final    NO GROWTH 1 DAY Performed at [facility], [place] [address] [place], [place] [address]    Report Status PENDING  Incomplete      Radiology Studies: DG CHEST PORT 1 VIEW  Result Date: 06/12/2021 CLINICAL  DATA:  Shortness of breath. EXAM: PORTABLE CHEST 1 VIEW COMPARISON:  June 11, 2021 FINDINGS: Mediastinal contour and  cardiac silhouette are stable. Heart size is enlarged. Dense consolidation of the right mid and lung base are identified without change. Increased pulmonary interstitium is identified bilaterally. The bony structures are stable. IMPRESSION: Congestive heart failure. Persistent consolidation of the right lung base with right pleural effusion unchanged. Electronically Signed   By: Abelardo Diesel M.D.   On: 06/12/2021 13:49    Scheduled Meds:  amLODipine  10 mg Oral Daily   apixaban  5 mg Oral BID   aspirin EC  81 mg Oral Daily   feeding supplement  237 mL Oral BID BM   insulin aspart  0-15 Units Subcutaneous TID WC   insulin aspart  0-5 Units Subcutaneous QHS   mouth rinse  15 mL Mouth Rinse BID   mirtazapine  7.5 mg Oral QHS   mometasone-formoterol  2 puff Inhalation BID   multivitamin with minerals  1 tablet Oral Daily   pantoprazole  40 mg Oral Daily   simvastatin  10 mg Oral Daily   umeclidinium bromide  1 puff Inhalation Daily   Continuous Infusions:  cefTRIAXone (ROCEPHIN)  IV 1 g (06/12/21 1525)   metronidazole 500 mg (06/13/21 0228)     LOS: 9 days   Marylu Lund, MD [facility] Pager On Amion  If 7PM-7AM, please contact night-coverage 06/13/2021, 3:45 PM

## 2021-06-13 NOTE — Progress Notes (Signed)
Daily Progress Note   Patient Name: Kevin Hobbs       Date: 06/13/2021 DOB: July 13, 1941  Age: 80 y.o. MRN#: [id number] Attending Physician: Donne Hazel, MD Primary Care Physician: Robyne Peers, MD Admit Date: 06/04/2021  Reason for Consultation/Follow-up: goals of care  Subjective: Met with patient and wife Kevin Hobbs at bedside. Their daughter Kevin Hobbs is also present for part of the discussion.   This situation has been very difficult for Kevin Hobbs and his family. Kevin Hobbs states receiving the bad news of his diagnosis was completely unexpected. She reports feeling like she is still in shock. Created space and opportunity for family to express thoughts and feelings regarding patient's current medical situation.  Emotional support provided   Reviewed plan for follow-up outpatient with oncology to discuss treatment options once all results are available from molecular testing.   Briefly attempted to discuss code status. Discussed that given his current medical situation, prognosis would be poor in the event of cardiopulmonary arrest. I made a recommendation for having a DNR/DNI order in place. At this time, Kevin Hobbs remains undecided. His family states "it has to be his decision". They indicate they will support him either way. I encouraged them to have further discussions as a family about code status.   I offered and explained the option for an outpatient palliative referral to check-in periodically with patient and family and continue goals of care discussions. Discussed that outpatient palliative can also help with transition to hospice at some point in the future if/when they are ready for that. Patient and family are agree that outpatient palliative would be helpful.   Length of  Stay: 9  Current Medications: Scheduled Meds:   amLODipine  10 mg Oral Daily   apixaban  5 mg Oral BID   aspirin EC  81 mg Oral Daily   feeding supplement  237 mL Oral BID BM   insulin aspart  0-15 Units Subcutaneous TID WC   insulin aspart  0-5 Units Subcutaneous QHS   mouth rinse  15 mL Mouth Rinse BID   mirtazapine  7.5 mg Oral QHS   mometasone-formoterol  2 puff Inhalation BID   multivitamin with minerals  1 tablet Oral Daily   pantoprazole  40 mg Oral Daily   simvastatin  10 mg Oral Daily  umeclidinium bromide  1 puff Inhalation Daily    Continuous Infusions:  cefTRIAXone (ROCEPHIN)  IV 1 g (06/13/21 1603)   metronidazole 500 mg (06/13/21 1729)    PRN Meds: acetaminophen **OR** acetaminophen, albuterol, ALPRAZolam, chlorpheniramine-HYDROcodone, guaiFENesin-dextromethorphan, traZODone  Physical Exam Constitutional:      General: He is not in acute distress.    Appearance: He is ill-appearing.  Pulmonary:     Effort: Pulmonary effort is normal.  Neurological:     Mental Status: He is alert and oriented to person, place, and time.  Psychiatric:        Behavior: Behavior is withdrawn.            Vital Signs: BP 137/73 (BP Location: Left Wrist)   Pulse 99   Temp 97.9 F (36.6 C) (Oral)   Resp (!) 25   Ht _0  (1.702 m)   Wt 107.1 kg   SpO2 95%   BMI 36.98 kg/m  SpO2: SpO2: 95 % O2 Device: O2 Device: Nasal Cannula O2 Flow Rate: O2 Flow Rate (L/min): 3 L/min  Intake/output summary:  Intake/Output Summary (Last 24 hours) at 06/13/2021 1746 Last data filed at 06/13/2021 1650 Gross per 24 hour  Intake --  Output 1050 ml  Net -1050 ml   LBM: Last BM Date: 06/11/21 Baseline Weight: Weight: 107 kg Most recent weight: Weight: 107.1 kg       Palliative Assessment/Data: PPS 40%     Palliative Care Assessment & Plan   HPI/Patient Profile: 80 y.o. male  with past medical history of recently diagnosed acute pulmonary embolism, right pleural effusion, right  middle lobe lung mass, recently diagnosed acute left lower extremity DVT, and type 2 diabetes. He was admitted to [facility] hospital on 06/04/2021 with acute on chronic hypoxic respiratory failure after presenting from home to [facility] emergency department complaining of shortness of breath.    Patient was recently hospitalized 05/25/21 - 05/27/21 at [facility] for acute hypoxic respiratory failure. Work-up included CTA chest which showed evidence of acute pulmonary embolism, right pleural effusion, and right middle lobe lung mass (which was reportedly new). 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   PET scan was done 06/03/21 and showed evidence of metastatic disease.   Assessment: - acute on chronic hypoxic respiratory failure - post obstructive pneumonia - pulmonary embolism - right middle lobe carcinoma, malignant pleural effusion - acute CVA, incidental - acute kidney injury  Recommendations/Plan: Full code full scope Continue mirtazapine 7.5 mg at bedtime (for appetite and sleep) Continue chlorpheniramine-hydrocodone (Tussionex) 10-8 mg/5 ml every 12 hours prn for cough Outpatient palliative ([facility])  Goals of Care and Additional Recommendations: Limitations on Scope of Treatment: Full Scope Treatment  Code Status: Full  Prognosis:  < 6 months would not be surprising   Discharge Planning: Home with outpatient palliative follow-up    Thank you for allowing the Palliative Medicine Team to assist in the care of this patient.   Total Time 25 minutes Prolonged Time Billed  no       Greater than 50%  of this time was spent counseling and coordinating care related to the above assessment and plan.  Lavena Bullion, NP  Please contact Palliative Medicine Team phone at [phone] for questions and concerns.

## 2021-06-14 ENCOUNTER — Inpatient Hospital Stay (HOSPITAL_COMMUNITY): Payer: PPO

## 2021-06-14 ENCOUNTER — Ambulatory Visit (HOSPITAL_COMMUNITY): Payer: PPO

## 2021-06-14 LAB — COMPREHENSIVE METABOLIC PANEL
ALT: 41 U/L (ref 0–44)
AST: 41 U/L (ref 15–41)
Albumin: 1.6 g/dL — ABNORMAL LOW (ref 3.5–5.0)
Alkaline Phosphatase: 101 U/L (ref 38–126)
Anion gap: 11 (ref 5–15)
BUN: 37 mg/dL — ABNORMAL HIGH (ref 8–23)
CO2: 21 mmol/L — ABNORMAL LOW (ref 22–32)
Calcium: 8.7 mg/dL — ABNORMAL LOW (ref 8.9–10.3)
Chloride: 108 mmol/L (ref 98–111)
Creatinine, Ser: 1.5 mg/dL — ABNORMAL HIGH (ref 0.61–1.24)
GFR, Estimated: 47 mL/min — ABNORMAL LOW (ref >60)
Glucose, Bld: 111 mg/dL — ABNORMAL HIGH (ref 70–99)
Potassium: 4.2 mmol/L (ref 3.5–5.1)
Sodium: 140 mmol/L (ref 135–145)
Total Bilirubin: 1 mg/dL (ref 0.3–1.2)
Total Protein: 6.4 g/dL — ABNORMAL LOW (ref 6.5–8.1)

## 2021-06-14 LAB — GLUCOSE, CAPILLARY
Glucose-Capillary: 315 mg/dL — ABNORMAL HIGH (ref 70–99)
Glucose-Capillary: 101 mg/dL — ABNORMAL HIGH (ref 70–99)
Glucose-Capillary: 138 mg/dL — ABNORMAL HIGH (ref 70–99)
Glucose-Capillary: 122 mg/dL — ABNORMAL HIGH (ref 70–99)
Glucose-Capillary: 174 mg/dL — ABNORMAL HIGH (ref 70–99)

## 2021-06-14 LAB — MAGNESIUM: Magnesium: 1.9 mg/dL (ref 1.7–2.4)

## 2021-06-14 MED ORDER — FUROSEMIDE 10 MG/ML IJ SOLN
40.0000 mg | Freq: Once | INTRAMUSCULAR | Status: AC
  Administered 2021-06-14: 40 mg via INTRAVENOUS
  Filled 2021-06-14: qty 4

## 2021-06-14 MED ORDER — ALBUTEROL SULFATE (2.5 MG/3ML) 0.083% IN NEBU
2.5000 mg | INHALATION_SOLUTION | Freq: Four times a day (QID) | RESPIRATORY_TRACT | Status: DC | PRN
  Administered 2021-06-14 – 2021-06-18 (×5): 2.5 mg via RESPIRATORY_TRACT
  Filled 2021-06-14 (×4): qty 3

## 2021-06-14 MED ORDER — FUROSEMIDE 10 MG/ML IJ SOLN
40.0000 mg | Freq: Once | INTRAMUSCULAR | Status: DC
  Filled 2021-06-14: qty 4

## 2021-06-14 MED ORDER — ALBUMIN HUMAN 25 % IV SOLN
25.0000 g | Freq: Once | INTRAVENOUS | Status: AC
  Administered 2021-06-14: 25 g via INTRAVENOUS
  Filled 2021-06-14: qty 100

## 2021-06-14 NOTE — Plan of Care (Signed)
  Problem: Education: Goal: Knowledge of General Education information will improve Description: Including pain rating scale, medication(s)/side effects and non-pharmacologic comfort measures Outcome: Progressing   Problem: Health Behavior/Discharge Planning: Goal: Ability to manage health-related needs will improve Outcome: Progressing   Problem: Clinical Measurements: Goal: Ability to maintain clinical measurements within normal limits will improve Outcome: Progressing Goal: Diagnostic test results will improve Outcome: Progressing Goal: Respiratory complications will improve Outcome: Progressing   Problem: Nutrition: Goal: Adequate nutrition will be maintained Outcome: Progressing   Problem: Safety: Goal: Ability to remain free from injury will improve Outcome: Progressing

## 2021-06-14 NOTE — Progress Notes (Signed)
PROGRESS NOTE    Kevin Hobbs  VCB:[id number] DOB: 1940/11/01 DOA: 06/04/2021 PCP: Robyne Peers, MD    Brief Narrative:  80 year old male past medical history for pulmonary embolism, acute lower extremity DVT, non-small cell lung cancer, and type 2 diabetes mellitus, who presented with recurrent dyspnea.  He had a recent hospitalization 8/23-8/25/2022 when he was diagnosed with pulmonary embolism, right pleural effusion and right middle lobe lung mass.  Underwent thoracentesis and 1.3 L of fluid was removed.  At home he had progressive recurrent dyspnea associated with cough and orthopnea.   Because of persistent and worsening symptoms he came back to the hospital.  On his initial physical examination he was afebrile, heart rate 78, blood pressure 117/58, respiratory rate 22-28, oxygen saturation 90% on 2 L of supplemental oxygen per nasal cannula.  His lungs had decreased breath sounds at the right side, no wheezing or rhonchi, heart S1-S2, present, rhythmic, soft abdomen, no lower extremity edema.   Sodium 136, potassium 5.3, chloride 107, bicarb 20, glucose 99, BUN 61, creatinine 1.49, AST 164, ALT 91, white count 12.1, hemoglobin 9.5, hematocrit 39.6, platelets 602 SARS COVID-19 negative.   Chest radiograph with right pleural effusion, right lower/ middle  lobe opacity.   EKG 88 bpm, normal axis, normal intervals, Q-wave V1, normal sinus rhythm, no significant ST segment or T wave changes.   Patient was admitted to the medical ward, he was placed on supplemental oxygen per nasal cannula. Underwent thoracentesis 22 cc of amber fluid was obtained. Received antibiotic therapy for possible post-obstructive pneumonia with improvement of his symptoms.   Brain MRI was performed as part of lung cancer staging finding new CVA bilateral. Neurology was consulted with recommendations for transfer to [facility] for further neurologic workup.     Assessment & Plan:   Principal Problem:   Acute on  chronic respiratory failure with hypoxemia (HCC) Active Problems:   Pulmonary embolism (HCC)   Pleural effusion   SOB (shortness of breath)   Diabetes mellitus without complication (HCC)   Essential hypertension   Hyperkalemia   GERD (gastroesophageal reflux disease)   Cerebrovascular accident (CVA) ([place])    Acute on chronic hypoxemic respiratory failure/postobstructive pneumonia/fever.  Swallow dysfunction. COPD exacerbation.  He received treatment with 5 days of azithromycin and prednisone along with albuterol as needed.  Speech therapy recommended a dysphagia 3 diet. His oxymetry at discharge was 94% on 2 L/min per [place]  -Patient continues with increased cough, recently noted to have a fever of 102.1.  Chest x-ray on 06/11/2021 notable for stable right basilar consolidation, right basilar mass and associated small right pleural effusion.   -Given concerns of developed pneumonia, patient was started on IV ceftriaxone, Flagyl.   -Recently noted to have increased cough and shortness of breath and again overnight and this AM -Ordered and repeat CXR, per my read, increased patchy infiltrates B with slightly larger effusion, most notable on L -Given another dose of IV lasix with symptomatic improvement thus far -Have ordered repeat CT chest, pending   2.  Right middle lobe carcinoma, malignant right pleural effusion/ incidental new CVA (bilateral) Brain MRI done for screening showed multiple scattered subcentimeter foci of diffusion abnormality involving the periventricular, deep and subcortical white matter of the cerebral hemispheres, consistent with acute to early subacute ischemic infarcts.  No evidence of intracranial metastatic disease. Few scattered chronic microhemorrhages involving the soft tentorial brain.   Echocardiogram from 08/22 had no intracavitary thrombus.   Consultation with neurology Dr Cheral Marker  has recommended patient to be transfer to [facility] for stroke team evaluation. No  evidence of intracardiac shunt.  Followed by neurology.  Continue anticoagulation with apixaban, statins.  Patient has been evaluated be physical therapy with recommendations for home health PT.  Seems to be stable neurologically at this time -Appreciate input by Palliative Care. Goals of Care discussions continue. Wishes at this time is full code   3.  Acute pulmonary embolism.  Tolerating anticoagulation with apixaban.   4.  Acute kidney injury, hyperkalemia.  Recently given IV Lasix with subsequent creatinine in excess of 1.6.  Losartan currently remains on hold.  Patient was given basal IV fluids overnight.   -Renal function has improved somewhat -Given worsening respiratory status with chest x-ray findings, further IV fluids were held and patient is now status post Lasix x1 with improvement in sob -recheck bmet in AM   5.  Type 2 diabetes mellitus/ dyslipidemia.  -Metformin on hold -Continue sliding scale insulin as needed   6.  Hypertension.  Had been on losartan and amlodipine for blood pressure control.  Given concerns of acute renal failure, have held ARB.Marland Kitchen  Continue amlodipine.as tolerated     7.  GERD.  Continue with PPI   8. Depression.  -Patient was prescribed escitalopram prior to admit and reportedly had not taken yet, currently on hold  Status is: Inpatient   DVT prophylaxis: Eliquis Code Status: Full Family Communication: Pt in room, family at bedside  Status is: Inpatient  Remains inpatient appropriate because:Inpatient level of care appropriate due to severity of illness  Dispo: The patient is from: Home              Anticipated d/c is to: Home              Patient currently is not medically stable to d/c.   Difficult to place patient No    Consultants:  Neurology Oncology Critical care  Procedures:  Right thoracentesis   Antimicrobials: Anti-infectives (From admission, onward)    Start     Dose/Rate Route Frequency Ordered Stop   06/11/21 1445   cefTRIAXone (ROCEPHIN) 1 g in sodium chloride 0.9 % 100 mL IVPB        1 g 200 mL/hr over 30 Minutes Intravenous Every 24 hours 06/11/21 1353     06/11/21 1445  metroNIDAZOLE (FLAGYL) IVPB 500 mg        500 mg 100 mL/hr over 60 Minutes Intravenous Every 12 hours 06/11/21 1354 06/14/21 0400   06/08/21 1000  azithromycin (ZITHROMAX) tablet 500 mg        500 mg Oral Daily 06/07/21 0905 06/09/21 1058   06/05/21 0300  azithromycin (ZITHROMAX) 500 mg in sodium chloride 0.9 % 250 mL IVPB  Status:  Discontinued        500 mg 250 mL/hr over 60 Minutes Intravenous Every 24 hours 06/05/21 0231 06/07/21 0905       Subjective: Reports increased sob  Objective: Vitals:   06/14/21 1100 06/14/21 1200 06/14/21 1340 06/14/21 1400  BP: 134/72 116/81 136/87 134/66  Pulse: (!) 112 (!) 110 (!) 110 (!) 113  Resp: (!) 32 (!) 24 (!) 23 (!) 34  Temp: 98.6 F (37 C) 97.6 F (36.4 C) 97.8 F (36.6 C) 98.6 F (37 C)  TempSrc: Oral Oral Oral Oral  SpO2: 96% 96% 96% 96%  Weight:      Height:        Intake/Output Summary (Last 24 hours) at 06/14/2021 1521 Last data  filed at 06/14/2021 1500 Gross per 24 hour  Intake 640 ml  Output 1860 ml  Net -1220 ml    Filed Weights   06/10/21 0500 06/12/21 0500 06/13/21 0500  Weight: 105.7 kg 105.8 kg 107.1 kg    Examination: General exam: Awake, laying in bed, in nad Respiratory system: Increased respiratory effort, no wheezing Cardiovascular system: regular rate, s1, s2 Gastrointestinal system: Soft, nondistended, positive BS Central nervous system: CN2-12 grossly intact, strength intact Extremities: Perfused, no clubbing Skin: Normal skin turgor, no notable skin lesions seen Psychiatry: Mood normal // no visual hallucinations   Data Reviewed: I have personally reviewed following labs and imaging studies  CBC: Recent Labs  Lab 06/09/21 0438 06/11/21 0312 06/12/21 0314 06/13/21 0238  WBC 17.9* 18.6* 17.5* 16.8*  HGB 10.6* 10.2* 9.2* 9.3*  HCT  32.6* 31.3* 28.8* 28.8*  MCV 84.9 85.3 85.7 86.5  PLT 597* 560* 467* 498*    Basic Metabolic Panel: Recent Labs  Lab 06/08/21 0413 06/09/21 0438 06/11/21 0312 06/12/21 0314 06/13/21 0238 06/14/21 0323  NA 145 144 138 137 140 140  K 4.4 4.4 4.1 4.3 4.3 4.2  CL 115* 113* 106 108 109 108  CO2 21* 22 19* 19* 21* 21*  GLUCOSE 105* 98 128* 129* 103* 111*  BUN 37* 28* 38* 39* 40* 37*  CREATININE 1.05 0.99 1.60* 1.51* 1.61* 1.50*  CALCIUM 9.1 9.3 9.0 8.3* 8.5* 8.7*  MG 1.8 1.8  --   --  1.7 1.9  PHOS 3.3 3.1  --   --   --   --     GFR: Estimated Creatinine Clearance: 47.4 mL/min (A) (by C-G formula based on SCr of 1.5 mg/dL (H)). Liver Function Tests: Recent Labs  Lab 06/11/21 0312 06/12/21 0314 06/13/21 0238 06/14/21 0323  AST 106* 48* 42* 41  ALT 101* 67* 52* 41  ALKPHOS 150* 114 102 101  BILITOT 1.6* 1.1 1.0 1.0  PROT 6.5 5.9* 6.1* 6.4*  ALBUMIN 1.7* 1.6* 1.5* 1.6*    No results for input(s): LIPASE, AMYLASE in the last 168 hours. No results for input(s): AMMONIA in the last 168 hours. Coagulation Profile: No results for input(s): INR, PROTIME in the last 168 hours. Cardiac Enzymes: No results for input(s): CKTOTAL, CKMB, CKMBINDEX, TROPONINI in the last 168 hours. BNP (last 3 results) No results for input(s): PROBNP in the last 8760 hours. HbA1C: No results for input(s): HGBA1C in the last 72 hours.  CBG: Recent Labs  Lab 06/13/21 1228 06/13/21 1626 06/13/21 2120 06/14/21 0630 06/14/21 1126  GLUCAP 180* 87 101* 101* 138*    Lipid Profile: No results for input(s): CHOL, HDL, LDLCALC, TRIG, CHOLHDL, LDLDIRECT in the last 72 hours.  Thyroid Function Tests: No results for input(s): TSH, T4TOTAL, FREET4, T3FREE, THYROIDAB in the last 72 hours. Anemia Panel: No results for input(s): VITAMINB12, FOLATE, FERRITIN, TIBC, IRON, RETICCTPCT in the last 72 hours. Sepsis Labs: Recent Labs  Lab 06/11/21 0312 06/11/21 0658 06/11/21 1003  LATICACIDVEN 1.4 1.1  1.1     Recent Results (from the past 240 hour(s))  SARS CORONAVIRUS 2 (TAT 6-24 HRS) Nasopharyngeal Nasopharyngeal Swab     Status: None   Collection Time: 06/04/21  3:32 PM   Specimen: Nasopharyngeal Swab  Result Value Ref Range Status   SARS Coronavirus 2 NEGATIVE NEGATIVE Final    Comment: (NOTE) SARS-CoV-2 target nucleic acids are NOT DETECTED.  The SARS-CoV-2 RNA is generally detectable in upper and lower respiratory specimens during the acute phase of  infection. Negative results do not preclude SARS-CoV-2 infection, do not rule out co-infections with other pathogens, and should not be used as the sole basis for treatment or other patient management decisions. Negative results must be combined with clinical observations, patient history, and epidemiological information. The expected result is Negative.  Fact Sheet for Patients: SugarRoll.be  Fact Sheet for Healthcare Providers: https://www.woods-mathews.com/  This test is not yet approved or cleared by the [place] FDA and  has been authorized for detection and/or diagnosis of SARS-CoV-2 by FDA under an Emergency Use Authorization (EUA). This EUA will remain  in effect (meaning this test can be used) for the duration of the COVID-19 declaration under Se ction 564(b)(1) of the Act, 21 U.S.C. section 360bbb-3(b)(1), unless the authorization is terminated or revoked sooner.  Performed at [facility], [place] [address], [place], [place] [address]   MRSA Next Gen by PCR, Nasal     Status: None   Collection Time: 06/05/21  8:50 AM   Specimen: Nasal Mucosa; Nasal Swab  Result Value Ref Range Status   MRSA by PCR Next Gen NOT DETECTED NOT DETECTED Final    Comment: (NOTE) The GeneXpert MRSA Assay (FDA approved for NASAL specimens only), is one component of a comprehensive MRSA colonization surveillance program. It is not intended to diagnose MRSA infection nor to guide or  monitor treatment for MRSA infections. Test performance is not FDA approved in patients less than 79 years old. Performed at [facility], [place] [address] [place], [place] [address]   Body fluid culture w Gram Stain     Status: None   Collection Time: 06/05/21 12:27 PM   Specimen: PATH Cytology Pleural fluid  Result Value Ref Range Status   Specimen Description   Final    PLEURAL RIGHT Performed at [facility] [address], [place], [place] [address]    Special Requests   Final    NONE Performed at [facility], [place] [address] [place], [place] [address]    Gram Stain   Final    RARE WBC PRESENT,BOTH PMN AND MONONUCLEAR NO ORGANISMS SEEN    Culture   Final    NO GROWTH 3 DAYS Performed at [facility], [place] [address], [place], [place] [address]    Report Status 06/08/2021 FINAL  Final  Culture, blood (routine x 2)     Status: None (Preliminary result)   Collection Time: 06/11/21  3:12 AM   Specimen: BLOOD  Result Value Ref Range Status   Specimen Description BLOOD LEFT ANTECUBITAL  Final   Special Requests   Final    BOTTLES DRAWN AEROBIC AND ANAEROBIC Blood Culture adequate volume   Culture   Final    NO GROWTH 3 DAYS Performed at [facility], [place] [address], [place], [place] [address]    Report Status PENDING  Incomplete  Culture, blood (routine x 2)     Status: None (Preliminary result)   Collection Time: 06/11/21  3:28 AM   Specimen: BLOOD LEFT HAND  Result Value Ref Range Status   Specimen Description BLOOD LEFT HAND  Final   Special Requests   Final    BOTTLES DRAWN AEROBIC AND ANAEROBIC Blood Culture adequate volume   Culture   Final    NO GROWTH 3 DAYS Performed at [facility], [place] [address] [place],  [address]    Report Status PENDING  Incomplete  Radiology Studies: DG CHEST PORT 1 VIEW  Result Date: 06/14/2021 CLINICAL DATA:  Follow-up pleural effusion EXAM:  PORTABLE CHEST 1 VIEW COMPARISON:  06/12/2021 FINDINGS: Cardiac shadow is stable. Elevation of the right hemidiaphragm is again noted. Right-sided pleural effusion is seen with right basilar infiltrate stable from the prior exam. The vascular congestion has improved in the interval from the prior study. No bony abnormality is seen. IMPRESSION: Persistent right basilar infiltrate and effusion Electronically Signed   By: Inez Catalina M.D.   On: 06/14/2021 14:05    Scheduled Meds:  amLODipine  10 mg Oral Daily   apixaban  5 mg Oral BID   aspirin EC  81 mg Oral Daily   feeding supplement  237 mL Oral BID BM   insulin aspart  0-15 Units Subcutaneous TID WC   insulin aspart  0-5 Units Subcutaneous QHS   mouth rinse  15 mL Mouth Rinse BID   mirtazapine  7.5 mg Oral QHS   multivitamin with minerals  1 tablet Oral Daily   pantoprazole  40 mg Oral Daily   simvastatin  10 mg Oral Daily   Continuous Infusions:  cefTRIAXone (ROCEPHIN)  IV 1 g (06/13/21 1603)     LOS: 10 days   Marylu Lund, MD [facility] Pager On Amion  If 7PM-7AM, please contact night-coverage 06/14/2021, 3:21 PM

## 2021-06-14 NOTE — Progress Notes (Signed)
Occupational Therapy Treatment Patient Details Name: GEROGE GILLIAM MRN: [id number] DOB: [birth year] Today's Date: 06/14/2021   History of present illness This 80 years old male with PMH significant for recently diagnosed acute pulmonary embolism, DVT,  pleural effusion and right middle lobe lung mass on , type 2 diabetes presented in the ED 06/04/21 with  acute hypoxic respiratory failure, For further staging lung cancer, MRI was done 9/06  which showed scattered embolic strokes involving bilateral anterior and posterior circulation. On 2 L home O2.   OT comments  Pt received sitting in recliner upon OT arrival. Pt was agreeable to therapy session. However, pt had DoE 3/4 at rest and increased wob. HR was 112bpm at rest, SpO2 95% at rest with 4lnc. Pt appeared to be falling asleep while in recliner, reports increased fatigue/lethargy and request to return to bed. He required minA at [facility] level this session and moderate cues for sequencing stand-pivot transfer. Pt required maxA to return to supine. Pt will continue to benefit from skilled OT services to maximize safety and independence with ADL/IADL and functional mobility. Will continue to follow acutely and progress as tolerated.    Recommendations for follow up therapy are one component of a multi-disciplinary discharge planning process, led by the attending physician.  Recommendations may be updated based on patient status, additional functional criteria and insurance authorization.    Follow Up Recommendations  Home health OT    Equipment Recommendations  None recommended by OT    Recommendations for Other Services      Precautions / Restrictions Precautions Precautions: Fall;Other (comment) Precaution Comments: monitor sats Restrictions Weight Bearing Restrictions: No       Mobility Bed Mobility Overal bed mobility: Needs Assistance Bed Mobility: Sit to Supine       Sit to supine: Max assist   General bed mobility comments:  maxA for BLE management and trunk management with return to supine    Transfers Overall transfer level: Needs assistance Equipment used: Rolling walker (2 wheeled) Transfers: Sit to/from Stand Sit to Stand: Min assist Stand pivot transfers: Min assist       General transfer comment: minA for powerup, cues for safe hand placement and cues for sequencing. Wife was assisting pt appropriately    Balance Overall balance assessment: Needs assistance Sitting-balance support: Feet supported;No upper extremity supported Sitting balance-Leahy Scale: Good     Standing balance support: Bilateral upper extremity supported;During functional activity Standing balance-Leahy Scale: Poor Standing balance comment: reliant on BUE support                           ADL either performed or assessed with clinical judgement   ADL Overall ADL's : Needs assistance/impaired Eating/Feeding: Set up;Sitting   Grooming: Set up;Sitting                   Toilet Transfer: Minimal assistance;Stand-pivot;RW Toilet Transfer Details (indicate cue type and reason): simulated with return to EOB from recliner Toileting- Clothing Manipulation and Hygiene: Minimal assistance;Sit to/from stand Toileting - Clothing Manipulation Details (indicate cue type and reason): minA for management of hospital gown     Functional mobility during ADLs: Minimal assistance;Rolling walker General ADL Comments: pt required increased cues for sequencing return to bed, appeared to be more limited secondary to lethargy.     Vision       Perception     Praxis      Cognition Arousal/Alertness: Lethargic Behavior During Therapy: Flat  affect Overall Cognitive Status: Difficult to assess                                 General Comments: pt lethargic during session, required increased cues for command following, pt reports fatigue and frequently drifting off while sitting up.        Exercises      Shoulder Instructions       General Comments SpO2 90s on 4lnc during exertion    Pertinent Vitals/ Pain       Pain Assessment: No/denies pain Pain Location: left middle finger  Home Living                                          Prior Functioning/Environment              Frequency  Min 2X/week        Progress Toward Goals  OT Goals(current goals can now be found in the care plan section)  Progress towards OT goals: Progressing toward goals  Acute Rehab OT Goals Patient Stated Goal: get stronger OT Goal Formulation: With patient/family Time For Goal Achievement: 06/09/2021 Potential to Achieve Goals: Good ADL Goals Pt Will Transfer to Toilet: with supervision;ambulating Pt Will Perform Toileting - Clothing Manipulation and hygiene: with supervision;sit to/from stand Additional ADL Goal #1: Patient will stand at sink to perform grooming task as evidence of improving activity tolerance  Plan Discharge plan remains appropriate    Co-evaluation                 AM-PAC OT "6 Clicks" Daily Activity     Outcome Measure   Help from another person eating meals?: A Little Help from another person taking care of personal grooming?: A Little Help from another person toileting, which includes using toliet, bedpan, or urinal?: A Lot Help from another person bathing (including washing, rinsing, drying)?: A Lot Help from another person to put on and taking off regular upper body clothing?: A Little Help from another person to put on and taking off regular lower body clothing?: A Lot 6 Click Score: 15    End of Session Equipment Utilized During Treatment: Rolling walker;Oxygen  OT Visit Diagnosis: Unsteadiness on feet (R26.81);Muscle weakness (generalized) (M62.81)   Activity Tolerance Patient tolerated treatment well   Patient Left in chair;with call bell/phone within reach;with family/visitor present   Nurse Communication Other (comment)  (okay to see)        Time: 0233-4356 OT Time Calculation (min): 24 min  Charges: OT General Charges $OT Visit: 1 Visit OT Treatments $Self Care/Home Management : 23-37 mins  Helene Kelp OTR/L Acute Rehabilitation Services Office: [place] 06/14/2021, 3:23 PM

## 2021-06-15 ENCOUNTER — Ambulatory Visit
Admit: 2021-06-15 | Discharge: 2021-06-15 | Disposition: A | Discharge status: Home / Self Care | Payer: PPO | Attending: Radiation Oncology | Admitting: Radiation Oncology

## 2021-06-15 ENCOUNTER — Inpatient Hospital Stay (HOSPITAL_COMMUNITY): Payer: PPO

## 2021-06-15 DIAGNOSIS — C3491 Malignant neoplasm of unspecified part of right bronchus or lung: Secondary | ICD-10-CM

## 2021-06-15 LAB — COMPREHENSIVE METABOLIC PANEL
ALT: 33 U/L (ref 0–44)
AST: 39 U/L (ref 15–41)
Albumin: 1.8 g/dL — ABNORMAL LOW (ref 3.5–5.0)
Alkaline Phosphatase: 92 U/L (ref 38–126)
Anion gap: 12 (ref 5–15)
BUN: 39 mg/dL — ABNORMAL HIGH (ref 8–23)
CO2: 22 mmol/L (ref 22–32)
Calcium: 8.9 mg/dL (ref 8.9–10.3)
Chloride: 105 mmol/L (ref 98–111)
Creatinine, Ser: 1.54 mg/dL — ABNORMAL HIGH (ref 0.61–1.24)
GFR, Estimated: 46 mL/min — ABNORMAL LOW (ref >60)
Glucose, Bld: 109 mg/dL — ABNORMAL HIGH (ref 70–99)
Potassium: 4.2 mmol/L (ref 3.5–5.1)
Sodium: 139 mmol/L (ref 135–145)
Total Bilirubin: 1.1 mg/dL (ref 0.3–1.2)
Total Protein: 6.4 g/dL — ABNORMAL LOW (ref 6.5–8.1)

## 2021-06-15 LAB — CBC
HCT: 28.4 % — ABNORMAL LOW (ref 39.0–52.0)
Hemoglobin: 9.1 g/dL — ABNORMAL LOW (ref 13.0–17.0)
MCH: 27.8 pg (ref 26.0–34.0)
MCHC: 32 g/dL (ref 30.0–36.0)
MCV: 86.9 fL (ref 80.0–100.0)
Platelets: 484 10*3/uL — ABNORMAL HIGH (ref 150–400)
RBC: 3.27 MIL/uL — ABNORMAL LOW (ref 4.22–5.81)
RDW: 17.2 % — ABNORMAL HIGH (ref 11.5–15.5)
WBC: 17.5 10*3/uL — ABNORMAL HIGH (ref 4.0–10.5)
nRBC: 0 % (ref 0.0–0.2)

## 2021-06-15 LAB — BLOOD GAS, ARTERIAL
Acid-base deficit: 2.5 mmol/L — ABNORMAL HIGH (ref 0.0–2.0)
Bicarbonate: 21 mmol/L (ref 20.0–28.0)
Drawn by: 34762
FIO2: 36
O2 Saturation: 99.5 %
Patient temperature: 37
pCO2 arterial: 31.1 mmHg — ABNORMAL LOW (ref 32.0–48.0)
pH, Arterial: 7.444 (ref 7.350–7.450)
pO2, Arterial: 150 mmHg — ABNORMAL HIGH (ref 83.0–108.0)

## 2021-06-15 LAB — GLUCOSE, CAPILLARY
Glucose-Capillary: 109 mg/dL — ABNORMAL HIGH (ref 70–99)
Glucose-Capillary: 121 mg/dL — ABNORMAL HIGH (ref 70–99)
Glucose-Capillary: 116 mg/dL — ABNORMAL HIGH (ref 70–99)
Glucose-Capillary: 154 mg/dL — ABNORMAL HIGH (ref 70–99)
Glucose-Capillary: 135 mg/dL — ABNORMAL HIGH (ref 70–99)

## 2021-06-15 MED ORDER — PIPERACILLIN-TAZOBACTAM 3.375 G IVPB
3.3750 g | Freq: Three times a day (TID) | INTRAVENOUS | Status: DC
  Administered 2021-06-15 – 2021-06-18 (×8): 3.375 g via INTRAVENOUS
  Filled 2021-06-15 (×8): qty 50

## 2021-06-15 MED ORDER — SODIUM CHLORIDE 0.9 % IV SOLN
500.0000 mg | INTRAVENOUS | Status: DC
  Administered 2021-06-15: 500 mg via INTRAVENOUS
  Filled 2021-06-15: qty 500

## 2021-06-15 NOTE — Progress Notes (Signed)
Daily Progress Note   Patient Name: Kevin Hobbs       Date: 06/15/2021 DOB: 10/18/1940  Age: 80 y.o. MRN#: [id number] Attending Physician: Donne Hazel, MD Primary Care Physician: Robyne Peers, MD Admit Date: 06/04/2021  Reason for Consultation/Follow-up: Establishing goals of care  Subjective: Received notification patient has declined. Received updates from Dr. Wyline Copas.   Chart review performed. Received report from primary RN - acute concerns of patient's increased work of breathing.  Went to visit patient at bedside - daughter/[name] present. Patient was lying in bed asleep - he does wake to voice/gentle touch but promptly falls back asleep. No signs or non-verbal gestures of pain noted. No respiratory distress or secretions; increased work of breathing noted.  Emotional support provided to [place]. Interval history reviewed since family's last meeting with PMT. Current options of radiotherapy vs transition to comfort care. During visit, Loma Sousa received phone call with additional information about radiotherapy option. We discussed and reviewed information she was given after the call -  treatment would be everyday for two weeks at [facility] and would not improve prognosis further than several days to weeks. I attempted to elicit values and goals of care important to the patient. Loma Sousa reflects on previous conversations with patient in which he stated he wanted to go home (but this was before radiotherapy was offered). Reviewed his wish to return home in context of radiation treatment, which would require him to remain in house, especially knowing his time is limited. Reviewed that radiation is not offered to be curative, but offered for symptom management. Loma Sousa expresses  understanding. She hopes to be able to review this information with patient and his wife/her mother/Shirly.   Continued discussion around DNR/DNI. Loma Sousa states she knows family does not want the patient to suffer. We reviewed that if he were to survive a resuscitative event, it would not change his overall prognosis. Encouraged patient/family to consider DNR/DNI status understanding evidenced based poor outcomes in similar hospitalized patient, as the cause of arrest is likely associated with advanced chronic/terminal illness rather than an easily reversible acute cardio-pulmonary event. I explained that DNR/DNI does not change the medical plan and it only comes into effect after a person has arrested (died).  It is a protective measure to keep [place] from harming the patient in their last  moments of life. Loma Sousa again expresses her desire for patient to make his own decisions, but also states they do not want to see him suffer. Reviewed with Loma Sousa that there will likely be a time, which may be now, that family will need to make medical decisions on his behalf because he cannot make the best decisions for himself - she expressed understanding and requested to speak with Elon Jester. Loma Sousa called [employer] via speaker phone. Reviewed DNR/DNI discussion with Elon Jester as previously outlined. Encouraged family to make decision soon as the medical team is concerned about his respiratory decline.  Family stated again they do not want patient to suffer and know they would not want heroic measures taken, but are hopeful patient can speak for himself. Elon Jester stated she would return to the hospital to try and attempt conversation with patient and Loma Sousa today.   Reviewed with Elon Jester additional information given about radiotherapy option as outlined above. Elon Jester and Loma Sousa would also like to discuss together and with patient.   Therapeutic listening and emotional support was provided throughout visit and family are  understandably tearful. 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  All questions and concerns addressed. Encouraged to call with questions and/or concerns. PMT card provided. Family will call PMT with any decisions today or will notify RN if after hours - RN can contact on call attending to change code status.       Length of Stay: 11  Current Medications: Scheduled Meds:   amLODipine  10 mg Oral Daily   apixaban  5 mg Oral BID   aspirin EC  81 mg Oral Daily   feeding supplement  237 mL Oral BID BM   insulin aspart  0-15 Units Subcutaneous TID WC   insulin aspart  0-5 Units Subcutaneous QHS   mouth rinse  15 mL Mouth Rinse BID   mirtazapine  7.5 mg Oral QHS   multivitamin with minerals  1 tablet Oral Daily   pantoprazole  40 mg Oral Daily   simvastatin  10 mg Oral Daily    Continuous Infusions:  piperacillin-tazobactam (ZOSYN)  IV      PRN Meds: acetaminophen **OR** acetaminophen, albuterol, ALPRAZolam, chlorpheniramine-HYDROcodone, guaiFENesin-dextromethorphan, traZODone  Physical Exam Vitals and nursing note reviewed.  Constitutional:      General: He is not in acute distress.    Appearance: He is ill-appearing.  Pulmonary:     Effort: No respiratory distress.     Comments: Increased work of breathing Skin:    General: Skin is warm and dry.  Neurological:     Mental Status: He is lethargic.     Motor: Weakness present.  Psychiatric:        Speech: Speech is delayed.            Vital Signs: BP (!) 128/57 (BP Location: Left Arm)   Pulse (!) 108   Temp 98.4 F (36.9 C) (Oral)   Resp (!) 23   Ht 5\' 7"  (1.702 m)   Wt 107.1 kg   SpO2 95%   BMI 36.98 kg/m  SpO2: SpO2: 95 % O2 Device: O2 Device: Nasal Cannula O2 Flow Rate: O2 Flow Rate (L/min): 4 L/min  Intake/output summary:  Intake/Output Summary (Last 24 hours) at 06/15/2021 1553 Last data filed at 06/15/2021 0902 Gross per 24 hour  Intake 220 ml  Output 1285 ml  Net -1065 ml   LBM:  Last BM Date: 06/12/21 Baseline Weight: Weight: 107 kg Most recent weight: Weight: 107.1 kg  Palliative Assessment/Data: PPS 40%      Patient Active Problem List   Diagnosis Date Noted   Cerebrovascular accident (CVA) ([place])    Pleural effusion    SOB (shortness of breath)    Diabetes mellitus without complication (HCC)    Essential hypertension    Hyperkalemia    GERD (gastroesophageal reflux disease)    Acute on chronic respiratory failure with hypoxemia ([place]) 06/04/2021   Pulmonary embolism ([place]) 05/25/2021   Pulmonary emboli ([place]) 05/25/2021    Palliative Care Assessment & Plan   Patient Profile: 80 y.o. male  with past medical history of recently diagnosed acute pulmonary embolism, right pleural effusion, right middle lobe lung mass, recently diagnosed acute left lower extremity DVT, and type 2 diabetes. He was admitted to [facility] hospital on 06/04/2021 with acute on chronic hypoxic respiratory failure after presenting from home to [facility] emergency department complaining of shortness of breath.    Patient was recently hospitalized 05/25/21 - 05/27/21 at [facility] for acute hypoxic respiratory failure. Work-up included CTA chest which showed evidence of acute pulmonary embolism, right pleural effusion, and right middle lobe lung mass (which was reportedly new). 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   PET scan was done 06/03/21 and showed evidence of metastatic disease.   Assessment: Acute on chronic hypoxic respiratory failure Post obstructive pneumonia Pulmonary embolism Right middle lobe carcinoma, malignant pleural effusion Acute CVA, incidental Acute kidney injury Swallow dysfunction COPD exacerbation    Recommendations/Plan: Continue current medical treatment Continue full code status for now - family will discuss initiation of DNR/DNI and notify PMT, or RN/attending if after hours, for any  changes to code status Family are considering radiotherapy vs home with hospice PMT will continue to follow and support holistically   Goals of Care and Additional Recommendations: Limitations on Scope of Treatment: Full Scope Treatment  Code Status:    Code Status Orders  (From admission, onward)           Start     Ordered   06/04/21 1901  Full code  Continuous        06/04/21 1900           Code Status History     Date Active Date Inactive Code Status Order ID Comments User Context   05/25/2021 2005 05/27/2021 2109 Full Code [id number]  Oswald Hillock, MD Inpatient      Advance Directive Documentation    Flowsheet Row Most Recent Value  Type of Advance Directive Healthcare Power of Attorney, Living will  Pre-existing out of facility DNR order (yellow form or pink MOST form) --  "MOST" Form in Place? --       Prognosis:  Likely weeks unless acute decline  Discharge Planning: To Be Determined   Care plan was discussed with primary RN, patient's family, Dr. Wyline Copas  Thank you for allowing the Palliative Medicine Team to assist in the care of this patient.   Total Time 75 minutes Prolonged Time Billed  yes       Greater than 50%  of this time was spent counseling and coordinating care related to the above assessment and plan.  Lin Landsman, NP  Please contact Palliative Medicine Team phone at [phone] for questions and concerns.

## 2021-06-15 NOTE — Progress Notes (Signed)
ABG sent. LAB notified.

## 2021-06-15 NOTE — Progress Notes (Addendum)
PROGRESS NOTE    Kevin Hobbs  BSJ:[id number] DOB: 1940-11-13 DOA: 06/04/2021 PCP: Robyne Peers, MD    Brief Narrative:  80 year old male past medical history for pulmonary embolism, acute lower extremity DVT, non-small cell lung cancer, and type 2 diabetes mellitus, who presented with recurrent dyspnea.  He had a recent hospitalization 8/23-8/25/2022 when he was diagnosed with pulmonary embolism, right pleural effusion and right middle lobe lung mass.  Underwent thoracentesis and 1.3 L of fluid was removed.  At home he had progressive recurrent dyspnea associated with cough and orthopnea.   Because of persistent and worsening symptoms he came back to the hospital.  On his initial physical examination he was afebrile, heart rate 78, blood pressure 117/58, respiratory rate 22-28, oxygen saturation 90% on 2 L of supplemental oxygen per nasal cannula.  His lungs had decreased breath sounds at the right side, no wheezing or rhonchi, heart S1-S2, present, rhythmic, soft abdomen, no lower extremity edema.   Sodium 136, potassium 5.3, chloride 107, bicarb 20, glucose 99, BUN 61, creatinine 1.49, AST 164, ALT 91, white count 12.1, hemoglobin 9.5, hematocrit 39.6, platelets 602 SARS COVID-19 negative.   Chest radiograph with right pleural effusion, right lower/ middle  lobe opacity.   EKG 88 bpm, normal axis, normal intervals, Q-wave V1, normal sinus rhythm, no significant ST segment or T wave changes.   Patient was admitted to the medical ward, he was placed on supplemental oxygen per nasal cannula. Underwent thoracentesis 22 cc of amber fluid was obtained. Received antibiotic therapy for possible post-obstructive pneumonia with improvement of his symptoms.   Brain MRI was performed as part of lung cancer staging finding new CVA bilateral. Neurology was consulted with recommendations for transfer to [facility] for further neurologic workup.     Assessment & Plan:   Principal Problem:   Acute on  chronic respiratory failure with hypoxemia (HCC) Active Problems:   Pulmonary embolism (HCC)   Pleural effusion   SOB (shortness of breath)   Diabetes mellitus without complication (HCC)   Essential hypertension   Hyperkalemia   GERD (gastroesophageal reflux disease)   Cerebrovascular accident (CVA) ([place])    Acute on chronic hypoxemic respiratory failure/postobstructive pneumonia/fever.  Swallow dysfunction. COPD exacerbation.  He initially received treatment with 5 days of azithromycin and prednisone along with albuterol as needed.  Speech therapy recommended a dysphagia 3 diet. -Patient recently noted to have increased cough with fever of 102.1.  Chest x-ray on 06/11/2021 notable for stable right basilar consolidation, right basilar mass and associated small right pleural effusion.   -Given concerns of developed pneumonia, patient was started on IV ceftriaxone, Flagyl.   -Despite abx, patient continued to have progressively worsening respiratory status -f/u CT chest on 9/13 reviewed, notable for significant post-obstructive pneumonia over R middle and lower lung fields -IV azithromycin added and Pulmonary consulted given increased work of breathing. Recommend broadening to zosyn, Rad-Onc consultation -Discussed with Radiation Oncology. Family is deciding if pt would like to pursue RT vs home with hospice -Palliative Care continues to follow   2.  Right middle lobe carcinoma, malignant right pleural effusion/ incidental new CVA (bilateral) Brain MRI done for screening showed multiple scattered subcentimeter foci of diffusion abnormality involving the periventricular, deep and subcortical white matter of the cerebral hemispheres, consistent with acute to early subacute ischemic infarcts.  No evidence of intracranial metastatic disease. Few scattered chronic microhemorrhages involving the soft tentorial brain.   Echocardiogram from 08/22 had no intracavitary thrombus.   Consultation with  neurology Dr Cheral Marker has recommended patient to be transfer to [facility] for stroke team evaluation. No evidence of intracardiac shunt.  Continue on anticoagulation with apixaban. Cont with statin Seems to be stable neurologically at this time   3.  Acute pulmonary embolism.  Tolerating anticoagulation with apixaban.   4.  Acute kidney injury, hyperkalemia.  Recently given IV Lasix with subsequent creatinine in excess of 1.6.  Losartan currently remains on hold.  Patient was given basal IV fluids overnight.   -Renal function stable thus far -repeat bmet in AM   5.  Type 2 diabetes mellitus/ dyslipidemia.  -Metformin on hold -Continue sliding scale insulin as needed   6.  Hypertension.  Had been on losartan and amlodipine for blood pressure control.  Given concerns of acute renal failure, have held ARB.Marland Kitchen  Continue amlodipine.as tolerated     7.  GERD.  Continue with PPI   8. Depression.  Pt was prescribed escitalopram prior to admit and reportedly had not taken yet, currently on hold  Status is: Inpatient   DVT prophylaxis: Eliquis Code Status: Full Family Communication: Pt in room, family at bedside  Status is: Inpatient  Remains inpatient appropriate because:Inpatient level of care appropriate due to severity of illness  Dispo: The patient is from: Home              Anticipated d/c is to: Home              Patient currently is not medically stable to d/c.   Difficult to place patient No   Consultants:  Neurology Oncology Critical care  Procedures:  Right thoracentesis   Antimicrobials: Anti-infectives (From admission, onward)    Start     Dose/Rate Route Frequency Ordered Stop   06/15/21 1545  piperacillin-tazobactam (ZOSYN) IVPB 3.375 g        3.375 g 12.5 mL/hr over 240 Minutes Intravenous Every 8 hours 06/15/21 1448     06/15/21 0830  azithromycin (ZITHROMAX) 500 mg in sodium chloride 0.9 % 250 mL IVPB  Status:  Discontinued        500 mg 250 mL/hr over 60 Minutes  Intravenous Every 24 hours 06/15/21 0743 06/15/21 1430   06/11/21 1445  cefTRIAXone (ROCEPHIN) 1 g in sodium chloride 0.9 % 100 mL IVPB  Status:  Discontinued        1 g 200 mL/hr over 30 Minutes Intravenous Every 24 hours 06/11/21 1353 06/15/21 1430   06/11/21 1445  metroNIDAZOLE (FLAGYL) IVPB 500 mg        500 mg 100 mL/hr over 60 Minutes Intravenous Every 12 hours 06/11/21 1354 06/14/21 0400   06/08/21 1000  azithromycin (ZITHROMAX) tablet 500 mg        500 mg Oral Daily 06/07/21 0905 06/09/21 1058   06/05/21 0300  azithromycin (ZITHROMAX) 500 mg in sodium chloride 0.9 % 250 mL IVPB  Status:  Discontinued        500 mg 250 mL/hr over 60 Minutes Intravenous Every 24 hours 06/05/21 0231 06/07/21 0905       Subjective: Claims to be breathing better, but visibly having increased resp effort  Objective: Vitals:   06/15/21 0422 06/15/21 0827 06/15/21 1021 06/15/21 1228  BP: (!) 125/58 137/76 129/67 (!) 128/57  Pulse:  (!) 104  (!) 108  Resp: (!) 25 (!) 27 (!) 23 (!) 23  Temp: 98.5 F (36.9 C) 99 F (37.2 C)  98.4 F (36.9 C)  TempSrc: Oral Oral  Oral  SpO2: 96% 98%  95% 95%  Weight:      Height:        Intake/Output Summary (Last 24 hours) at 06/15/2021 1653 Last data filed at 06/15/2021 0902 Gross per 24 hour  Intake 220 ml  Output 1285 ml  Net -1065 ml    Filed Weights   06/10/21 0500 06/12/21 0500 06/13/21 0500  Weight: 105.7 kg 105.8 kg 107.1 kg    Examination: General exam: Conversant, appears in mild resp distress Respiratory system: Increased respiratory effort, coarse Cardiovascular system: regular rhythm, s1-s2 Gastrointestinal system: Nondistended, nontender, pos BS Central nervous system: No seizures, no tremors Extremities: No cyanosis, no joint deformities Skin: No rashes, no pallor Psychiatry: Affect normal // no auditory hallucinations   Data Reviewed: I have personally reviewed following labs and imaging studies  CBC: Recent Labs  Lab  06/09/21 0438 06/11/21 0312 06/12/21 0314 06/13/21 0238 06/15/21 0358  WBC 17.9* 18.6* 17.5* 16.8* 17.5*  HGB 10.6* 10.2* 9.2* 9.3* 9.1*  HCT 32.6* 31.3* 28.8* 28.8* 28.4*  MCV 84.9 85.3 85.7 86.5 86.9  PLT 597* 560* 467* 498* 484*    Basic Metabolic Panel: Recent Labs  Lab 06/09/21 0438 06/11/21 0312 06/12/21 0314 06/13/21 0238 06/14/21 0323 06/15/21 0358  NA 144 138 137 140 140 139  K 4.4 4.1 4.3 4.3 4.2 4.2  CL 113* 106 108 109 108 105  CO2 22 19* 19* 21* 21* 22  GLUCOSE 98 128* 129* 103* 111* 109*  BUN 28* 38* 39* 40* 37* 39*  CREATININE 0.99 1.60* 1.51* 1.61* 1.50* 1.54*  CALCIUM 9.3 9.0 8.3* 8.5* 8.7* 8.9  MG 1.8  --   --  1.7 1.9  --   PHOS 3.1  --   --   --   --   --     GFR: Estimated Creatinine Clearance: 46.1 mL/min (A) (by C-G formula based on SCr of 1.54 mg/dL (H)). Liver Function Tests: Recent Labs  Lab 06/11/21 0312 06/12/21 0314 06/13/21 0238 06/14/21 0323 06/15/21 0358  AST 106* 48* 42* 41 39  ALT 101* 67* 52* 41 33  ALKPHOS 150* 114 102 101 92  BILITOT 1.6* 1.1 1.0 1.0 1.1  PROT 6.5 5.9* 6.1* 6.4* 6.4*  ALBUMIN 1.7* 1.6* 1.5* 1.6* 1.8*    No results for input(s): LIPASE, AMYLASE in the last 168 hours. No results for input(s): AMMONIA in the last 168 hours. Coagulation Profile: No results for input(s): INR, PROTIME in the last 168 hours. Cardiac Enzymes: No results for input(s): CKTOTAL, CKMB, CKMBINDEX, TROPONINI in the last 168 hours. BNP (last 3 results) No results for input(s): PROBNP in the last 8760 hours. HbA1C: No results for input(s): HGBA1C in the last 72 hours.  CBG: Recent Labs  Lab 06/14/21 1626 06/14/21 2113 06/15/21 0619 06/15/21 0832 06/15/21 1231  GLUCAP 122* 174* 109* 121* 116*    Lipid Profile: No results for input(s): CHOL, HDL, LDLCALC, TRIG, CHOLHDL, LDLDIRECT in the last 72 hours.  Thyroid Function Tests: No results for input(s): TSH, T4TOTAL, FREET4, T3FREE, THYROIDAB in the last 72 hours. Anemia  Panel: No results for input(s): VITAMINB12, FOLATE, FERRITIN, TIBC, IRON, RETICCTPCT in the last 72 hours. Sepsis Labs: Recent Labs  Lab 06/11/21 1324 06/11/21 0658 06/11/21 1003  LATICACIDVEN 1.4 1.1 1.1     Recent Results (from the past 240 hour(s))  Culture, blood (routine x 2)     Status: None (Preliminary result)   Collection Time: 06/11/21  3:12 AM   Specimen: BLOOD  Result Value Ref Range Status  Specimen Description BLOOD LEFT ANTECUBITAL  Final   Special Requests   Final    BOTTLES DRAWN AEROBIC AND ANAEROBIC Blood Culture adequate volume   Culture   Final    NO GROWTH 4 DAYS Performed at [facility], [address], [place], [place] [address]    Report Status PENDING  Incomplete  Culture, blood (routine x 2)     Status: None (Preliminary result)   Collection Time: 06/11/21  3:28 AM   Specimen: BLOOD LEFT HAND  Result Value Ref Range Status   Specimen Description BLOOD LEFT HAND  Final   Special Requests   Final    BOTTLES DRAWN AEROBIC AND ANAEROBIC Blood Culture adequate volume   Culture   Final    NO GROWTH 4 DAYS Performed at [facility], [place] [address], [place], [place] [address]    Report Status PENDING  Incomplete      Radiology Studies: DG Abd 1 View  Result Date: 06/15/2021 CLINICAL DATA:  Constipation EXAM: ABDOMEN - 1 VIEW COMPARISON:  None. FINDINGS: There is a large stool burden in the right colon and to a lesser degree throughout the remainder of the colon and rectum. There is gaseous distention of the small bowel throughout the abdomen without evidence of mechanical obstruction. There is no gross organomegaly or abnormal soft tissue calcification. Multiple pelvic surgical clips are noted. The bones are unremarkable. IMPRESSION: Large stool burden predominately in the right colon without evidence of mechanical obstruction. Electronically Signed   By: Valetta Mole M.D.   On: 06/15/2021 16:12   CT CHEST WO CONTRAST  Result Date:  06/14/2021 CLINICAL DATA:  Normal x-ray. EXAM: CT CHEST WITHOUT CONTRAST TECHNIQUE: Multidetector CT imaging of the chest was performed following the standard protocol without IV contrast. COMPARISON:  None. FINDINGS: Cardiovascular: There is moderate severity calcification of the aortic arch. Normal heart size with marked severity coronary artery calcification. A small pericardial effusion is noted. Mediastinum/Nodes: There is mild pretracheal lymphadenopathy. Narrowing of the proximal portions of the bilateral mainstem bronchi is seen. The thyroid gland and esophagus demonstrate no significant findings. Lungs/Pleura: Marked severity airspace disease is seen throughout the right middle lobe and right lower lobe. A small right pleural effusion is noted. There is no evidence of a pneumothorax. Upper Abdomen: No acute abnormality. Musculoskeletal: No chest wall mass or suspicious bone lesions identified. IMPRESSION: 1. Marked severity airspace disease throughout the right middle lobe and right lower lobe, consistent with pneumonia. Follow-up to resolution is recommended to exclude the presence of an underlying neoplastic process. 2. Small right pleural effusion. 3. Small pericardial effusion. 4. Narrowing of the proximal portions of the bilateral mainstem bronchi. Aortic Atherosclerosis (ICD10-I70.0). Electronically Signed   By: Virgina Norfolk M.D.   On: 06/14/2021 19:13   DG CHEST PORT 1 VIEW  Result Date: 06/14/2021 CLINICAL DATA:  Follow-up pleural effusion EXAM: PORTABLE CHEST 1 VIEW COMPARISON:  06/12/2021 FINDINGS: Cardiac shadow is stable. Elevation of the right hemidiaphragm is again noted. Right-sided pleural effusion is seen with right basilar infiltrate stable from the prior exam. The vascular congestion has improved in the interval from the prior study. No bony abnormality is seen. IMPRESSION: Persistent right basilar infiltrate and effusion Electronically Signed   By: Inez Catalina M.D.   On:  06/14/2021 14:05    Scheduled Meds:  amLODipine  10 mg Oral Daily   apixaban  5 mg Oral BID   aspirin EC  81 mg Oral Daily  feeding supplement  237 mL Oral BID BM   insulin aspart  0-15 Units Subcutaneous TID WC   insulin aspart  0-5 Units Subcutaneous QHS   mouth rinse  15 mL Mouth Rinse BID   mirtazapine  7.5 mg Oral QHS   multivitamin with minerals  1 tablet Oral Daily   pantoprazole  40 mg Oral Daily   simvastatin  10 mg Oral Daily   Continuous Infusions:  piperacillin-tazobactam (ZOSYN)  IV       LOS: 11 days   Marylu Lund, MD [facility] Pager On Amion  If 7PM-7AM, please contact night-coverage 06/15/2021, 4:53 PM

## 2021-06-15 NOTE — Progress Notes (Signed)
Placed patient on BIPAP  due to increased WOB and SOB.

## 2021-06-15 NOTE — Progress Notes (Signed)
Called to assess patient for increased WOB and SOB.  Patient currently on 2L [place]. Labored but no distress. BBS noted for rales and rhonchi. Patient performed flutter valve with good effort, NPC. RN to notify provider for possible Lasix.

## 2021-06-15 NOTE — Progress Notes (Signed)
Pharmacy Antibiotic Note  ADIR SCHICKER is a 80 y.o. male admitted on 06/04/2021 with ongoing respiratory distress. Pharmacy has been consulted for Zosyn dosing with concern for postobstructive PNA and no clinical improvement with ceftriaxone/metronidazole.  Plan: Zosyn EI 3.375g q8h Follow LOT, cultures, renal function   Height: 5\' 7"  (170.2 cm) Weight: 107.1 kg (236 lb 1.8 oz) IBW/kg (Calculated) : 66.1  Temp (24hrs), Avg:98.5 F (36.9 C), Min:98 F (36.7 C), Max:99 F (37.2 C)  Recent Labs  Lab 06/09/21 0438 06/11/21 0312 06/11/21 0658 06/11/21 1003 06/12/21 0314 06/13/21 0238 06/14/21 0323 06/15/21 0358  WBC 17.9* 18.6*  --   --  17.5* 16.8*  --  17.5*  CREATININE 0.99 1.60*  --   --  1.51* 1.61* 1.50* 1.54*  LATICACIDVEN  --  1.4 1.1 1.1  --   --   --   --     Estimated Creatinine Clearance: 46.1 mL/min (A) (by C-G formula based on SCr of 1.54 mg/dL (H)).    No Known Allergies  Antimicrobials this admission: Metronidazole 9/9 >> 9/12 Ceftriaxone 9/9 >> 9/12 Zosyn 9/13 >>  Microbiology results: 9/9 BCx: NGTD  Thank you for allowing pharmacy to be a part of this patient's care.  Arrie Senate, PharmD, BCPS, [facility] Clinical Pharmacist [phone] Please check AMION for all [place] numbers 06/15/2021

## 2021-06-15 NOTE — Consult Note (Signed)
Name: Kevin Hobbs MRN: [id number] DOB: August 02, 1941    ADMISSION DATE:  06/04/2021 CONSULTATION DATE:  06/15/2021   REFERRING MD :  Ulla Gallo  CHIEF COMPLAINT: Respiratory distress   HISTORY OF PRESENT ILLNESS: 80 year old man who was hospitalized 8/23-8/25 for shortness of breath and hypoxia, CT angiogram chest showed acute pulm embolism, right pleural effusion and a large 8 cm  right middle lobe lung mass.  He underwent RT thoracentesis with removal of 1.3 L of fluid and cytology showed malignant cells consistent with non-small cell carcinoma. PET scan 9/2 showed evidence of metastatic disease to pleura, mediastinal lymph nodes and bone -right iliac, T12 vertebra, sixth rib and left scapula He was readmitted 9/2 due to persistent cough, dyspnea and orthopnea.  Chest x-ray again showed small right effusion with right middle lobe opacity, and again underwent thoracentesis with removal of 20 cc of fluid.  He was treated with antibiotics for possible postobstructive pneumonia.  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 PCCM is consulted for increased work of breathing  Echo 8/24 showed normal LVEF, bubble study was negative  PAST MEDICAL HISTORY :  Past Medical History:  Diagnosis Date   Acute DVT (deep venous thrombosis) (HCC)    LLE; Aug '22   Acute pulmonary embolism ([place])    Aug '22   Diabetes mellitus without complication ([place])    Hypertension    Pleural effusion    right-sided   Past Surgical History:  Procedure Laterality Date   HERNIA  REPAIR     Prior to Admission medications   Medication Sig Start Date End Date Taking? Authorizing Provider  acetaminophen (TYLENOL) 325 MG tablet Take 2 tablets (650 mg total) by mouth every 6 (six) hours as needed for mild pain (or Fever >/= 101). 05/27/21  Yes Sheikh, Omair Latif, DO  amLODipine (NORVASC) 10 MG tablet Take 10 mg by mouth daily. 05/04/21  Yes [provider]  apixaban (ELIQUIS) 5 MG TABS tablet Take 2 tablets (10 mg total) by mouth 2 (two) times daily for 7 days, THEN 1 tablet (5 mg total) 2 (two) times daily for 28 days. 05/27/21 07/01/21 Yes Sheikh, Omair Latif, DO  budesonide-formoterol (SYMBICORT) 80-4.5 MCG/ACT inhaler Inhale 2 puffs into the lungs in the morning and at bedtime. 05/27/21  Yes Sheikh, Omair Latif, DO  clarithromycin (BIAXIN) 500 MG tablet Take 500 mg by mouth 2 (two) times daily. Start date : 05/24/21   Yes [provider]  losartan (COZAAR) 100 MG tablet Take 100 mg by mouth daily. 03/29/21  Yes [provider]  metFORMIN (GLUCOPHAGE-XR) 500 MG 24 hr tablet Take 1,000 mg by mouth daily. 02/28/21  Yes [provider]  pantoprazole (PROTONIX) 40 MG tablet Take 40 mg by mouth daily. 05/04/21  Yes [provider]  simvastatin (ZOCOR) 10 MG tablet Take 10 mg by mouth daily. 05/04/21  Yes [provider]  sodium chloride (OCEAN) 0.65 % nasal spray Place 1 spray into the nose at bedtime.   Yes [provider]  tiotropium (SPIRIVA HANDIHALER) 18 MCG inhalation capsule Place 1 capsule (  18 mcg total) into inhaler and inhale daily. 05/27/21  Yes Sheikh, Omair Latif, DO  amoxicillin (AMOXIL) 500 MG tablet Take 1,000 mg by mouth 2 (two) times daily. 05/24/21   [provider]  escitalopram (LEXAPRO) 5 MG tablet Take 5 mg by mouth daily. 06/03/21   [provider]  feeding supplement (ENSURE ENLIVE / ENSURE PLUS) LIQD Take 237 mLs by mouth 2 (two) times daily between meals. 06/09/21 07/09/21  Arrien, Jimmy Picket, MD  ondansetron (ZOFRAN) 4 MG tablet Take 1 tablet (4 mg total) by mouth every 6 (six) hours as needed for nausea. 05/27/21   Raiford Noble Latif, DO   No Known Allergies  FAMILY HISTORY:  History reviewed. No pertinent family history. SOCIAL HISTORY:  reports that he has quit smoking. His smoking use included cigarettes. He has never used smokeless tobacco. He reports that he does not currently use alcohol. He reports that he does not use drugs.  REVIEW OF SYSTEMS:   Positive for fever, shortness of breath, congestion, minimal sputum production  Constitutional: Negative for fever, chills, weight loss, malaise/fatigue and diaphoresis.  HENT: Negative for hearing loss, ear pain, nosebleeds, congestion, sore throat, neck pain, tinnitus and ear discharge.   Eyes: Negative for blurred vision, double vision, photophobia, pain, discharge and redness.  Respiratory: Negative for hemoptysis, sputum production, wheezing and stridor.   Cardiovascular: Negative for chest pain, palpitations, orthopnea, claudication, leg swelling and PND.  Gastrointestinal: Negative for heartburn, nausea, vomiting, abdominal pain, diarrhea, constipation, blood in stool and melena.  Genitourinary: Negative for dysuria, urgency, frequency, hematuria and flank pain.  Musculoskeletal: Negative for myalgias, back pain, joint pain and falls.  Skin: Negative for itching and rash.  Neurological: Negative for dizziness, tingling, tremors, sensory change, speech change, focal weakness, seizures, loss of consciousness, weakness and headaches.  Endo/Heme/Allergies: Negative for environmental allergies and polydipsia. Does not bruise/bleed easily.  SUBJECTIVE:   VITAL SIGNS: Temp:  [97.8 F (36.6 C)-99 F (37.2 C)] 98.4 F (36.9 C) (09/13 1228) Pulse Rate:  [103-113] 108 (09/13 1228) Resp:  [23-34] 23 (09/13 1228) BP: (120-137)/(52-87) 128/57 (09/13 1228) SpO2:  [95 %-98 %] 95 % (09/13 1228)  PHYSICAL  EXAMINATION: Gen. Pleasant, well-nourished, in mild distress, normal affect ENT - no lesions, no post nasal drip Neck: No JVD, no thyromegaly, no carotid bruits Lungs: + use of accessory muscles, some abdominal breathing, RT dullness to percussion, decreased breath sounds on right Cardiovascular: Rhythm regular, heart sounds  normal, no murmurs, no peripheral edema Abdomen: soft and non-tender, no hepatosplenomegaly, BS normal. Musculoskeletal: No deformities, no cyanosis or clubbing Neuro:  alert, non focal Skin:  Warm, no lesions/ rash   Recent Labs  Lab 06/13/21 0238 06/14/21 0323 06/15/21 0358  NA 140 140 139  K 4.3 4.2 4.2  CL 109 108 105  CO2 21* 21* 22  BUN 40* 37* 39*  CREATININE 1.61* 1.50* 1.54*  GLUCOSE 103* 111* 109*   Recent Labs  Lab 06/12/21 0314 06/13/21 0238 06/15/21 0358  HGB 9.2* 9.3* 9.1*  HCT 28.8* 28.8* 28.4*  WBC 17.5* 16.8* 17.5*  PLT 467* 498* 484*   CT CHEST WO CONTRAST  Result Date: 06/14/2021 CLINICAL DATA:  Normal x-ray. EXAM: CT CHEST WITHOUT CONTRAST TECHNIQUE: Multidetector CT imaging of the chest was performed following the standard protocol without IV contrast. COMPARISON:  None. FINDINGS: Cardiovascular: There is moderate severity calcification of the aortic arch. Normal heart size with marked severity coronary artery calcification. A small pericardial effusion is noted. Mediastinum/Nodes:  There is mild pretracheal lymphadenopathy. Narrowing of the proximal portions of the bilateral mainstem bronchi is seen. The thyroid gland and esophagus demonstrate no significant findings. Lungs/Pleura: Marked severity airspace disease is seen throughout the right middle lobe and right lower lobe. A small right pleural effusion is noted. There is no evidence of a pneumothorax. Upper Abdomen: No acute abnormality. Musculoskeletal: No chest wall mass or suspicious bone lesions identified. IMPRESSION: 1. Marked severity airspace disease throughout the right  middle lobe and right lower lobe, consistent with pneumonia. Follow-up to resolution is recommended to exclude the presence of an underlying neoplastic process. 2. Small right pleural effusion. 3. Small pericardial effusion. 4. Narrowing of the proximal portions of the bilateral mainstem bronchi. Aortic Atherosclerosis (ICD10-I70.0). Electronically Signed   By: Virgina Norfolk M.D.   On: 06/14/2021 19:13   DG CHEST PORT 1 VIEW  Result Date: 06/14/2021 CLINICAL DATA:  Follow-up pleural effusion EXAM: PORTABLE CHEST 1 VIEW COMPARISON:  06/12/2021 FINDINGS: Cardiac shadow is stable. Elevation of the right hemidiaphragm is again noted. Right-sided pleural effusion is seen with right basilar infiltrate stable from the prior exam. The vascular congestion has improved in the interval from the prior study. No bony abnormality is seen. IMPRESSION: Persistent right basilar infiltrate and effusion Electronically Signed   By: Inez Catalina M.D.   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 Gentzler is struggling, while his oxygen requirement is low, he is using accessory muscles and abdominal breathing.  This is likely due to postobstructive pneumonia with a large mass obstructing his airways and possibly impeding on his trachea.  Unfortunately he is too sick for chemotherapy.  Radiation may be an option but I doubt that this will bring him back to his premorbid level.  I showed his imaging to his wife and daughter and discussed his current state and goals of care.  Unfortunately this has progressed too rapidly for them to make any decisions about end-of-life issues and they are struggling with this decision making. We discussed using morphine to help with his dyspnea and cough and daughter was hesitant for me to write for this until they discuss. I explained that life  support in the situation would be of no benefit  Recommend -Broaden antibiotics to Zosyn for postobstructive pneumonia -Flutter valve and tracheobronchial toilet -Radiation oncology consult -If not a candidate, proceed with full palliative measures    Kara Mead MD. FCCP. [facility] Pager [phone] If no response call [phone]    06/15/2021, 1:39 PM

## 2021-06-16 DIAGNOSIS — Z66 Do not resuscitate: Secondary | ICD-10-CM

## 2021-06-16 DIAGNOSIS — Z789 Other specified health status: Secondary | ICD-10-CM

## 2021-06-16 DIAGNOSIS — Z711 Person with feared health complaint in whom no diagnosis is made: Secondary | ICD-10-CM

## 2021-06-16 LAB — COMPREHENSIVE METABOLIC PANEL
ALT: 30 U/L (ref 0–44)
AST: 55 U/L — ABNORMAL HIGH (ref 15–41)
Albumin: 1.7 g/dL — ABNORMAL LOW (ref 3.5–5.0)
Alkaline Phosphatase: 84 U/L (ref 38–126)
Anion gap: 15 (ref 5–15)
BUN: 44 mg/dL — ABNORMAL HIGH (ref 8–23)
CO2: 21 mmol/L — ABNORMAL LOW (ref 22–32)
Calcium: 9 mg/dL (ref 8.9–10.3)
Chloride: 104 mmol/L (ref 98–111)
Creatinine, Ser: 1.5 mg/dL — ABNORMAL HIGH (ref 0.61–1.24)
GFR, Estimated: 47 mL/min — ABNORMAL LOW (ref >60)
Glucose, Bld: 119 mg/dL — ABNORMAL HIGH (ref 70–99)
Potassium: 4.4 mmol/L (ref 3.5–5.1)
Sodium: 140 mmol/L (ref 135–145)
Total Bilirubin: 1.1 mg/dL (ref 0.3–1.2)
Total Protein: 6.1 g/dL — ABNORMAL LOW (ref 6.5–8.1)

## 2021-06-16 LAB — CULTURE, BLOOD (ROUTINE X 2)
Culture: NO GROWTH
Culture: NO GROWTH
Special Requests: ADEQUATE
Special Requests: ADEQUATE

## 2021-06-16 LAB — GLUCOSE, CAPILLARY
Glucose-Capillary: 127 mg/dL — ABNORMAL HIGH (ref 70–99)
Glucose-Capillary: 133 mg/dL — ABNORMAL HIGH (ref 70–99)
Glucose-Capillary: 124 mg/dL — ABNORMAL HIGH (ref 70–99)
Glucose-Capillary: 107 mg/dL — ABNORMAL HIGH (ref 70–99)
Glucose-Capillary: 130 mg/dL — ABNORMAL HIGH (ref 70–99)

## 2021-06-16 LAB — CBC
HCT: 27.9 % — ABNORMAL LOW (ref 39.0–52.0)
Hemoglobin: 8.9 g/dL — ABNORMAL LOW (ref 13.0–17.0)
MCH: 27.8 pg (ref 26.0–34.0)
MCHC: 31.9 g/dL (ref 30.0–36.0)
MCV: 87.2 fL (ref 80.0–100.0)
Platelets: 441 10*3/uL — ABNORMAL HIGH (ref 150–400)
RBC: 3.2 MIL/uL — ABNORMAL LOW (ref 4.22–5.81)
RDW: 17.2 % — ABNORMAL HIGH (ref 11.5–15.5)
WBC: 17.5 10*3/uL — ABNORMAL HIGH (ref 4.0–10.5)
nRBC: 0 % (ref 0.0–0.2)

## 2021-06-16 NOTE — Progress Notes (Signed)
PT Cancellation Note  Patient Details Name: Kevin Hobbs MRN: [id number] DOB: 03-Jul-1941   Cancelled Treatment:    Reason Eval/Treat Not Completed: (P) Patient not medically ready (per RN, pt with unstable vitals/respiratory status overnight and this AM, will hold this afternoon.) Will continue efforts per PT POC next date if medically appropriate.   Kevin Hobbs [name] 06/16/2021, 11:51 AM

## 2021-06-16 NOTE — Progress Notes (Signed)
PROGRESS NOTE    Kevin Hobbs  XNA:[id number] DOB: 08-07-1941 DOA: 06/04/2021 PCP: Robyne Peers, MD   Brief Narrative:  This 80 year old male with PMH significant for  type 2 diabetes mellitus, who presented with recurrent dyspnea.  He had a recent hospitalization from 8/23-8/25/2022 when he was diagnosed with pulmonary embolism, right pleural effusion and right middle lobe lung mass. He underwent thoracentesis and 1.3 L of fluid was removed.  At home he had progressive recurrent shortness of breath associated with cough and orthopnea.  Because of persistent and worsening symptoms he came back to the hospital.  He was admitted for acute on chronic hypoxic respiratory failure and  was placed on supplemental oxygen per nasal cannula. He underwent  repeat  thoracentesis 22 cc of amber fluid was obtained. He has received antibiotic therapy for possible post-obstructive pneumonia with improvement of his symptoms. Brain MRI was performed as part of lung cancer staging , finding new CVA bilateral. Neurology was consulted with recommendations for transfer to [facility] for further neurologic workup.  Patient has decided to be DNR/DNI and would not want to receive palliative radiation therapy.  He is requesting to go home on hospice at this time.    Assessment & Plan:   Principal Problem:   Acute on chronic respiratory failure with hypoxemia (HCC) Active Problems:   Pulmonary embolism (HCC)   Pleural effusion   SOB (shortness of breath)   Diabetes mellitus without complication (HCC)   Essential hypertension   Hyperkalemia   GERD (gastroesophageal reflux disease)   Cerebrovascular accident (CVA) ([place])   Acute on chronic hypoxic respiratory failure sec. to COPD and postoperative pneumonia. He initially received treatment with 5 days of azithromycin and prednisone along with albuterol as needed.  Speech therapy recommended a dysphagia 3 diet. Patient has worsening cough with fever of 102.1.   Chest  x-ray on 06/11/2021 notable for stable right basilar consolidation, right basilar mass and associated small right pleural effusion.   Given concerns of developed pneumonia, patient was started on IV ceftriaxone, Flagyl.   Despite abx, patient continued to have progressively worsening respiratory status. F/u CT chest on 9/13 reviewed, notable for significant post-obstructive pneumonia over R middle and lower lung fields Initiated on IV Zithromax, Pulmonary consulted due to increased work of breathing. Recommend broadening to zosyn, Rad-Onc consultation. Continue BiPAP at night and as needed. Discussed with Radiation Oncology.  Patient does not want to receive palliative radiation therapy.    He is requesting to go home on hospice at this time. 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  No evidence of intracranial metastatic disease. Few scattered chronic microhemorrhages involving the soft tentorial brain. Echocardiogram from 08/22 had no intracavitary thrombus. Dr. Cheral Marker from neurology has recommended patient to be transfer to [facility] for stroke team evaluation. No evidence of intracardiac shunt.  Continue on anticoagulation with apixaban. Cont with statin Seems to be stable neurologically at this time.   Acute pulmonary embolism.  Continue with Eliquis.   Acute kidney injury: >  Improved. Recently given IV Lasix with subsequent creatinine in excess of 1.6.   Losartan currently remains on hold.  Patient received IV fluid bolus. Functions improving and back to baseline. Repeat BMP in  a.m.   Type 2 diabetes: Metformin on hold Continue sliding scale insulin as  needed   Hypertension : Hold losartan, continue amlodipine.   GERD;  continue PPI   Depression: Pt was prescribed escitalopram prior to admit and reportedly had not taken yet, currently on hold     DVT prophylaxis: Eliquis Code Status: DNR/DNI Family Communication: Wife at bedside Disposition Plan:   Status is: Inpatient  Remains inpatient appropriate because:Inpatient level of care appropriate due to severity of illness  Dispo: The patient is from: Home              Anticipated d/c is to: Home              Patient currently is not medically stable to d/c.   Difficult to place patient No   Consultants:  Pulmonology Neurology Palliative care.  Procedures:  Bipap Antimicrobials:   Anti-infectives (From admission, onward)    Start     Dose/Rate Route Frequency Ordered Stop   06/15/21 1545  piperacillin-tazobactam (ZOSYN) IVPB 3.375 g        3.375 g 12.5 mL/hr over 240 Minutes Intravenous Every 8 hours 06/15/21 1448     06/15/21 0830  azithromycin (ZITHROMAX) 500 mg in sodium chloride 0.9 % 250 mL IVPB  Status:  Discontinued        500 mg 250 mL/hr over 60 Minutes Intravenous Every 24 hours 06/15/21 0743 06/15/21 1430   06/11/21 1445  cefTRIAXone (ROCEPHIN) 1 g in sodium chloride 0.9 % 100 mL IVPB  Status:  Discontinued        1 g 200 mL/hr over 30 Minutes Intravenous Every 24 hours 06/11/21 1353 06/15/21 1430   06/11/21 1445  metroNIDAZOLE (FLAGYL) IVPB 500 mg        500 mg 100 mL/hr over 60 Minutes Intravenous Every 12 hours 06/11/21 1354 06/14/21 0400   06/08/21 1000  azithromycin (ZITHROMAX) tablet 500 mg        500 mg Oral Daily 06/07/21 0905 06/09/21 1058   06/05/21 0300  azithromycin (ZITHROMAX) 500 mg in sodium chloride 0.9 % 250 mL IVPB  Status:  Discontinued        500 mg 250 mL/hr over 60 Minutes Intravenous Every 24 hours 06/05/21 0231 06/07/21 0905        Subjective: Patient was seen and examined at bedside.  Overnight events noted.  Patient reports  feeling improved.   Wife at bedside states,  he has used BiPAP last night which made him much improved.  Objective: Vitals:   06/16/21 0807 06/16/21 0825 06/16/21 0939 06/16/21 1113  BP: (!) 98/48  (!) 109/55 (!) 91/50  Pulse: 83 91  99  Resp: (!) 30 (!) 26  (!) 31  Temp: 98.9 F (37.2 C)   97.9 F (36.6 C)  TempSrc: Oral   Oral  SpO2: 98%   98%  Weight:      Height:        Intake/Output Summary (Last 24 hours) at 06/16/2021 1341 Last data filed at 06/16/2021 0526 Gross per 24 hour  Intake 255.49 ml  Output 600 ml  Net -344.51 ml   Filed Weights   06/10/21 0500 06/12/21 0500 06/13/21 0500  Weight: 105.7 kg 105.8 kg 107.1 kg    Examination:  General exam: Appears comfortable, sitting in the chair with oxygen via nasal cannula. Respiratory system: Clear to auscultation bilaterally, no wheezing.  Decreased breath sounds. Cardiovascular system: S1-S2 heard, regular rate and rhythm, no murmur. Gastrointestinal system: Abdomen  is soft, nontender, nondistended, BS+ Central nervous system: Alert and oriented x 3. No focal neurological deficits. Extremities: No edema, no cyanosis, no clubbing. Skin: No rashes, lesions or ulcers Psychiatry: Judgement and insight appear normal. Mood & affect appropriate.     Data Reviewed: I have personally reviewed following labs and imaging studies  CBC: Recent Labs  Lab 06/11/21 0312 06/12/21 0314 06/13/21 0238 06/15/21 0358 06/16/21 0343  WBC 18.6* 17.5* 16.8* 17.5* 17.5*  HGB 10.2* 9.2* 9.3* 9.1* 8.9*  HCT 31.3* 28.8* 28.8* 28.4* 27.9*  MCV 85.3 85.7 86.5 86.9 87.2  PLT 560* 467* 498* 484* 295*   Basic Metabolic Panel: Recent Labs  Lab 06/12/21 0314 06/13/21 0238 06/14/21 0323 06/15/21 0358 06/16/21 0343  NA 137 140 140 139 140  K 4.3 4.3 4.2 4.2 4.4  CL 108 109 108 105 104  CO2 19* 21* 21* 22 21*  GLUCOSE 129* 103* 111* 109* 119*  BUN 39* 40* 37* 39* 44*  CREATININE 1.51* 1.61* 1.50* 1.54* 1.50*  CALCIUM 8.3* 8.5*  8.7* 8.9 9.0  MG  --  1.7 1.9  --   --    GFR: Estimated Creatinine Clearance: 47.4 mL/min (A) (by C-G formula based on SCr of 1.5 mg/dL (H)). Liver Function Tests: Recent Labs  Lab 06/12/21 0314 06/13/21 0238 06/14/21 0323 06/15/21 0358 06/16/21 0343  AST 48* 42* 41 39 55*  ALT 67* 52* 41 33 30  ALKPHOS 114 102 101 92 84  BILITOT 1.1 1.0 1.0 1.1 1.1  PROT 5.9* 6.1* 6.4* 6.4* 6.1*  ALBUMIN 1.6* 1.5* 1.6* 1.8* 1.7*   No results for input(s): LIPASE, AMYLASE in the last 168 hours. No results for input(s): AMMONIA in the last 168 hours. Coagulation Profile: No results for input(s): INR, PROTIME in the last 168 hours. Cardiac Enzymes: No results for input(s): CKTOTAL, CKMB, CKMBINDEX, TROPONINI in the last 168 hours. BNP (last 3 results) No results for input(s): PROBNP in the last 8760 hours. HbA1C: No results for input(s): HGBA1C in the last 72 hours. CBG: Recent Labs  Lab 06/15/21 1231 06/15/21 1716 06/15/21 2145 06/16/21 0959 06/16/21 1109  GLUCAP 116* 154* 135* 127* 133*   Lipid Profile: No results for input(s): CHOL, HDL, LDLCALC, TRIG, CHOLHDL, LDLDIRECT in the last 72 hours. Thyroid Function Tests: No results for input(s): TSH, T4TOTAL, FREET4, T3FREE, THYROIDAB in the last 72 hours. Anemia Panel: No results for input(s): VITAMINB12, FOLATE, FERRITIN, TIBC, IRON, RETICCTPCT in the last 72 hours. Sepsis Labs: Recent Labs  Lab 06/11/21 2841 06/11/21 0658 06/11/21 1003  LATICACIDVEN 1.4 1.1 1.1    Recent Results (from the past 240 hour(s))  Culture, blood (routine x 2)     Status: None   Collection Time: 06/11/21  3:12 AM   Specimen: BLOOD  Result Value Ref Range Status   Specimen Description BLOOD LEFT ANTECUBITAL  Final   Special Requests   Final    BOTTLES DRAWN AEROBIC AND ANAEROBIC Blood Culture adequate volume   Culture   Final    NO GROWTH 5 DAYS Performed at [facility], [address], [place], [place] [address]    Report Status  06/16/2021 FINAL  Final  Culture, blood (routine x 2)     Status: None   Collection Time: 06/11/21  3:28 AM   Specimen: BLOOD LEFT HAND  Result Value Ref Range Status   Specimen Description BLOOD LEFT HAND  Final   Special Requests   Final    BOTTLES DRAWN AEROBIC AND  ANAEROBIC Blood Culture adequate volume   Culture   Final    NO GROWTH 5 DAYS Performed at [facility], [place] [address], [place], [place] [address]    Report Status 06/16/2021 FINAL  Final    Radiology Studies: DG Abd 1 View  Result Date: 06/15/2021 CLINICAL DATA:  Constipation EXAM: ABDOMEN - 1 VIEW COMPARISON:  None. FINDINGS: There is a large stool burden in the right colon and to a lesser degree throughout the remainder of the colon and rectum. There is gaseous distention of the small bowel throughout the abdomen without evidence of mechanical obstruction. There is no gross organomegaly or abnormal soft tissue calcification. Multiple pelvic surgical clips are noted. The bones are unremarkable. IMPRESSION: Large stool burden predominately in the right colon without evidence of mechanical obstruction. Electronically Signed   By: Valetta Mole M.D.   On: 06/15/2021 16:12   CT CHEST WO CONTRAST  Result Date: 06/14/2021 CLINICAL DATA:  Normal x-ray. EXAM: CT CHEST WITHOUT CONTRAST TECHNIQUE: Multidetector CT imaging of the chest was performed following the standard protocol without IV contrast. COMPARISON:  None. FINDINGS: Cardiovascular: There is moderate severity calcification of the aortic arch. Normal heart size with marked severity coronary artery calcification. A small pericardial effusion is noted. Mediastinum/Nodes: There is mild pretracheal lymphadenopathy. Narrowing of the proximal portions of the bilateral mainstem bronchi is seen. The thyroid gland and esophagus demonstrate no significant findings. Lungs/Pleura: Marked severity airspace disease is seen throughout the right middle lobe and right lower lobe. A small  right pleural effusion is noted. There is no evidence of a pneumothorax. Upper Abdomen: No acute abnormality. Musculoskeletal: No chest wall mass or suspicious bone lesions identified. IMPRESSION: 1. Marked severity airspace disease throughout the right middle lobe and right lower lobe, consistent with pneumonia. Follow-up to resolution is recommended to exclude the presence of an underlying neoplastic process. 2. Small right pleural effusion. 3. Small pericardial effusion. 4. Narrowing of the proximal portions of the bilateral mainstem bronchi. Aortic Atherosclerosis (ICD10-I70.0). Electronically Signed   By: Virgina Norfolk M.D.   On: 06/14/2021 19:13    Scheduled Meds:  amLODipine  10 mg Oral Daily   apixaban  5 mg Oral BID   aspirin EC  81 mg Oral Daily   feeding supplement  237 mL Oral BID BM   insulin aspart  0-15 Units Subcutaneous TID WC   insulin aspart  0-5 Units Subcutaneous QHS   mouth rinse  15 mL Mouth Rinse BID   mirtazapine  7.5 mg Oral QHS   multivitamin with minerals  1 tablet Oral Daily   pantoprazole  40 mg Oral Daily   simvastatin  10 mg Oral Daily   Continuous Infusions:  piperacillin-tazobactam (ZOSYN)  IV 3.375 g (06/16/21 0950)     LOS: 12 days    Time spent: 35 mins    Lovelle Lema, MD [facility]   If 7PM-7AM, please contact night-coverage

## 2021-06-16 NOTE — Progress Notes (Signed)
NAME:  Kevin Hobbs, MRN:  [id number], DOB:  1941-03-04, LOS: 12 ADMISSION DATE:  06/04/2021, CONSULTATION DATE:  06/15/21 REFERRING MD:  Wyline Cop[facility] CHIEF COMPLAINT:  Respiratory Distress   History of Present Illness:  80 year old man who was hospitalized 8/23-8/25 for shortness of breath and hypoxia, CT angiogram chest showed acute pulm embolism, right pleural effusion and a large 8 cm  right middle lobe lung mass.  He underwent RT thoracentesis with removal of 1.3 L of fluid and cytology showed malignant cells consistent with non-small cell carcinoma. PET scan 9/2 showed evidence of metastatic disease to pleura, mediastinal lymph nodes and bone -right iliac, T12 vertebra, sixth rib and left scapula He was readmitted 9/2 due to persistent cough, dyspnea and orthopnea.  Chest x-ray again showed small right effusion with right middle lobe opacity, and again underwent thoracentesis with removal of 20 cc of fluid.  He was treated with antibiotics for possible postobstructive pneumonia.  Brain MRI showed finding of new bilateral subcentimeter acute to early subacute ischemic infarcts  hence transferred from [place] to [facility]  He was initially treated with 5 days of azithromycin but then developed a fever of 102 on 9/9 and started on ceftriaxone and Flagyl  PCCM consulted on 9/13 for increased work of breathing.   Pertinent  Medical History  DVT Diabetes Mellitus Type II Hypertension  Significant Hospital Events: Including procedures, antibiotic start and stop dates in addition to other pertinent events     Interim History / Subjective:  Patient feeling better this morning after using cpap/bipap overnight.   Objective   Blood pressure (!) 91/50, pulse 99, temperature 97.9 F (36.6 C), temperature source Oral, resp. rate (!) 31, height 5\' 7"  (1.702 m), weight 107.1 kg, SpO2 98 %.    FiO2 (%):  [30 %] 30 %   Intake/Output Summary (Last 24 hours) at 06/16/2021 1201 Last data filed at 06/16/2021  0526 Gross per 24 hour  Intake 255.49 ml  Output 600 ml  Net -344.51 ml   Filed Weights   06/10/21 0500 06/12/21 0500 06/13/21 0500  Weight: 105.7 kg 105.8 kg 107.1 kg    Examination: General: Elderly male, chronically ill appearing, obese HENT: [place]/AT, sclera anicteric, moist mucous membranes Lungs: diminished breath sounds. 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 He is requesting to go home on hospice at this time.  - Would continues IV antibiotics until discharge and can be transitioned to augmentin. - Continue cpap/bipap PRN for comfort measures  PCCM will sign off.  Labs   CBC: Recent Labs  Lab 06/11/21 0312 06/12/21 0314 06/13/21 0238 06/15/21 0358 06/16/21 0343  WBC 18.6* 17.5* 16.8* 17.5* 17.5*  HGB 10.2* 9.2* 9.3* 9.1* 8.9*  HCT 31.3* 28.8* 28.8* 28.4* 27.9*  MCV 85.3 85.7 86.5 86.9 87.2  PLT 560* 467* 498* 484* 441*    Basic Metabolic Panel: Recent Labs  Lab 06/12/21 0314 06/13/21 0238 06/14/21 0323 06/15/21 0358 06/16/21 0343  NA 137 140 140 139 140  K 4.3 4.3 4.2 4.2 4.4  CL 108 109 108 105 104  CO2 19* 21* 21* 22 21*  GLUCOSE  129* 103* 111* 109* 119*  BUN 39* 40* 37* 39* 44*  CREATININE 1.51* 1.61* 1.50* 1.54* 1.50*  CALCIUM 8.3* 8.5* 8.7* 8.9 9.0  MG  --  1.7 1.9  --   --    GFR: Estimated Creatinine Clearance: 47.4 mL/min (A) (by C-G formula based on SCr of 1.5 mg/dL (H)). Recent Labs  Lab 06/11/21 0312 06/11/21 0658 06/11/21 1003 06/12/21 0314 06/13/21 0238 06/15/21 0358 06/16/21 0343  WBC 18.6*  --   --  17.5*  16.8* 17.5* 17.5*  LATICACIDVEN 1.4 1.1 1.1  --   --   --   --     Liver Function Tests: Recent Labs  Lab 06/12/21 0314 06/13/21 0238 06/14/21 0323 06/15/21 0358 06/16/21 0343  AST 48* 42* 41 39 55*  ALT 67* 52* 41 33 30  ALKPHOS 114 102 101 92 84  BILITOT 1.1 1.0 1.0 1.1 1.1  PROT 5.9* 6.1* 6.4* 6.4* 6.1*  ALBUMIN 1.6* 1.5* 1.6* 1.8* 1.7*   No results for input(s): LIPASE, AMYLASE in the last 168 hours. No results for input(s): AMMONIA in the last 168 hours.  ABG    Component Value Date/Time   PHART 7.444 06/15/2021 2111   PCO2ART 31.1 (L) 06/15/2021 2111   PO2ART 150 (H) 06/15/2021 2111   HCO3 21.0 06/15/2021 2111   ACIDBASEDEF 2.5 (H) 06/15/2021 2111   O2SAT 99.5 06/15/2021 2111     Coagulation Profile: No results for input(s): INR, PROTIME in the last 168 hours.  Cardiac Enzymes: No results for input(s): CKTOTAL, CKMB, CKMBINDEX, TROPONINI in the last 168 hours.  HbA1C: Hgb A1c MFr Bld  Date/Time Value Ref Range Status  06/11/2021 03:12 AM 6.5 (H) 4.8 - 5.6 % Final    Comment:    (NOTE) Pre diabetes:          5.7%-6.4%  Diabetes:              >6.4%  Glycemic control for   <7.0% adults with diabetes   05/25/2021 06:06 PM 6.8 (H) 4.8 - 5.6 % Final    Comment:    (NOTE) Pre diabetes:          5.7%-6.4%  Diabetes:              >6.4%  Glycemic control for   <7.0% adults with diabetes     CBG: Recent Labs  Lab 06/15/21 1231 06/15/21 1716 06/15/21 2145 06/16/21 0959 06/16/21 1109  GLUCAP 116* 154* 135* 127* 133*       Critical care time: n/a    Freda Jackson, MD [facility] Office: [phone]   See Amion for personal pager PCCM on call pager [phone] until 7pm. Please call Elink 7p-7a. [phone]

## 2021-06-16 NOTE — Consult Note (Addendum)
Radiation Oncology         [phone] ________________________________  Initial inpatient Consultation- Conducted via telephone due to current COVID-19 concerns for limiting patient exposure  Name: Kevin Hobbs MRN: [id number]  Date of Service: 06/04/2021 DOB: 1940-12-06  AS:[id number], Wynonia Musty, MD  No ref. provider found   REFERRING PHYSICIAN: Rigoberto Noel., MD  DIAGNOSIS: 80 year old male with new diagnosis of widely metastatic NSCLC, squamous cell carcinoma of the right lung with involvement of the pleural fluid, lymph nodes and skeleton.    ICD-10-CM   1. 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 Pleural effusion  J90 US THORACENTESIS ASP PLEURAL SPACE W/IMG GUIDE    US THORACENTESIS ASP PLEURAL SPACE W/IMG GUIDE    Cytology - Non PAP;    Cytology - Non PAP;    Body fluid culture w Gram Stain    Body fluid culture w Gram Stain    DG CHEST PORT 1 VIEW    DG CHEST PORT 1 VIEW    DG CHEST PORT 1 VIEW    DG CHEST PORT 1 VIEW    3. S/P thoracentesis  Z98.890 DG Chest 1 View    DG Chest 1 View    Cytology - Non PAP;    Cytology - Non PAP;    Body fluid culture w Gram Stain    Body fluid culture w Gram Stain    4. Cerebrovascular accident (CVA), unspecified mechanism ([place])  I63.9 Ambulatory referral to Neurology    CANCELED: Ambulatory referral to Neurology    5. SOB (shortness of breath)  R06.02 DG CHEST PORT 1 VIEW    DG CHEST PORT 1 VIEW    DG CHEST PORT 1 VIEW    DG CHEST PORT 1 VIEW    6. Constipation  K59.00 DG Abd 1 View    DG Abd 1 View      HISTORY OF PRESENT ILLNESS: Kevin Hobbs is a 81 y.o. male seen at the request of Dr. Elsworth Soho.  He was previously admitted 05/25/21 through 05/27/21 for complaints of shortness of breath and hypoxia.  A CT angiogram of the chest performed at that time showed an acute pulmonary  embolism as well as right pleural effusion and a large, 8 cm right middle lobe lung mass.  Interventional radiology performed thoracentesis with removal of 1.3 L of fluid and final cytology showed malignant cells consistent with NSCLC, squamous cell carcinoma.  He was discharged home on inhalers and oral anticoagulation with plans for an outpatient PET scan and follow-up with pulmonology.  The PET scan was performed on 06/03/2021 and showed a large, hypermetabolic mass centered in the right middle lobe, occluding the right middle lobe bronchi as well as a small loculated pleural effusion, consistent with malignant effusion, with hypermetabolic right supraclavicular, right paratracheal, subcarinal and bilateral hilar lymph nodes as well as scattered foci of osseous lytic lesions, compatible with osseous metastatic disease.  He presented back to the emergency department on 06/04/2021, prior to his scheduled outpatient follow-up visit with pulmonology, with progressive shortness of breath and dry cough.  He was admitted and started on IV antibiotic therapy for possible postobstructive pneumonia.  A brain MRI was performed as part of the lung cancer staging showing new, small bilateral infarcts.  Neurology was  consulted with recommendations for transfer to [facility] for further neurologic work-up.  He was originally scheduled for an outpatient consult with Dr. Julien Nordmann on 06/09/2021 but due to his admission this was not possible so he was evaluated in the hospital by Dr. Chryl Heck who recommended obtaining molecular testing and PD-L1 to help guide any further treatment recommendations.  Given the advanced nature of his disease, palliative care was consulted and met with the patient on 06/13/2021 to discuss goals of care.  Due to the patient's continued and progressive shortness of breath despite antibiotic therapy for the suspected postobstructive pneumonia, radiation oncology was consulted for consideration of palliative  radiotherapy to help alleviate obstruction and improve his breathing.  I have reviewed his case and imaging with Dr. Lisbeth Renshaw who supports the recommendation for a 2-week course of palliative radiotherapy in an effort to improve his breathing and quality of life.  I called the patient's room to discuss this recommendation but unfortunately the patient was sleeping so I was able to have a conversation with his daughter, Kevin Hobbs who was there in the room with the patient.  PREVIOUS RADIATION THERAPY: No  PAST MEDICAL HISTORY:  Past Medical History:  Diagnosis Date   Acute DVT (deep venous thrombosis) (HCC)    LLE; Aug '22   Acute pulmonary embolism ([place])    Aug '22   Diabetes mellitus without complication ([place])    Hypertension    Pleural effusion    right-sided      PAST SURGICAL HISTORY: Past Surgical History:  Procedure Laterality Date   HERNIA REPAIR      FAMILY HISTORY: History reviewed. No pertinent family history.  SOCIAL HISTORY:  Social History   Socioeconomic History   Marital status: Married    Spouse name: Not on file   Number of children: Not on file   Years of education: Not on file   Highest education level: Not on file  Occupational History   Not on file  Tobacco Use   Smoking status: Former    Types: Cigarettes   Smokeless tobacco: Never  Substance and Sexual Activity   Alcohol use: Not Currently   Drug use: Never   Sexual activity: Not on file  Other Topics Concern   Not on file  Social History Narrative   Not on file   Social Determinants of Health   Financial Resource Strain: Not on file  Food Insecurity: Not on file  Transportation Needs: Not on file  Physical Activity: Not on file  Stress: Not on file  Social Connections: Not on file  Intimate Partner Violence: Not on file    ALLERGIES: Patient has no known allergies.  MEDICATIONS:  Current Facility-Administered Medications  Medication Dose Route Frequency Provider Last Rate Last Admin    acetaminophen (TYLENOL) tablet 650 mg  650 mg Oral Q6H PRN Howerter, Justin B, DO   650 mg at 06/16/21 0525   Or   acetaminophen (TYLENOL) suppository 650 mg  650 mg Rectal Q6H PRN Howerter, Justin B, DO       albuterol (PROVENTIL) (2.5 MG/3ML) 0.083% nebulizer solution 2.5 mg  2.5 mg Inhalation Q6H PRN Donne Hazel, MD   2.5 mg at 06/15/21 1931   ALPRAZolam (XANAX) tablet 0.25 mg  0.25 mg Oral BID PRN Donne Hazel, MD       amLODipine (NORVASC) tablet 10 mg  10 mg Oral Daily Shawna Clamp, MD   10 mg at 06/15/21 1042   apixaban (ELIQUIS) tablet 5 mg  5 mg Oral BID Howerter, Justin B, DO   5 mg at 06/16/21 0943   aspirin EC tablet 81 mg  81 mg Oral Daily Rosalin Hawking, MD   81 mg at 06/16/21 0943   chlorpheniramine-HYDROcodone (TUSSIONEX) 10-8 MG/5ML suspension 5 mL  5 mL Oral Q12H PRN Donne Hazel, MD   5 mL at 06/14/21 0902   feeding supplement (ENSURE ENLIVE / ENSURE PLUS) liquid 237 mL  237 mL Oral BID BM Howerter, Justin B, DO   237 mL at 06/15/21 1600   guaiFENesin-dextromethorphan (ROBITUSSIN DM) 100-10 MG/5ML syrup 10 mL  10 mL Oral Q4H PRN Arrien, Jimmy Picket, MD   10 mL at 06/13/21 0603   insulin aspart (novoLOG) injection 0-15 Units  0-15 Units Subcutaneous TID WC Donne Hazel, MD   2 Units at 06/16/21 1215   insulin aspart (novoLOG) injection 0-5 Units  0-5 Units Subcutaneous QHS Donne Hazel, MD       MEDLINE mouth rinse  15 mL Mouth Rinse BID Howerter, Justin B, DO   15 mL at 06/16/21 0952   mirtazapine (REMERON) tablet 7.5 mg  7.5 mg Oral QHS Elie Confer B, NP   7.5 mg at 06/15/21 2203   multivitamin with minerals tablet 1 tablet  1 tablet Oral Daily Howerter, Justin B, DO   1 tablet at 06/16/21 0943   pantoprazole (PROTONIX) EC tablet 40 mg  40 mg Oral Daily Howerter, Justin B, DO   40 mg at 06/16/21 0943   piperacillin-tazobactam (ZOSYN) IVPB 3.375 g  3.375 g Intravenous Q8H Einar Grad, RPH 12.5 mL/hr at 06/16/21 0950 3.375 g at 06/16/21 0950    simvastatin (ZOCOR) tablet 10 mg  10 mg Oral Daily Howerter, Justin B, DO   10 mg at 06/16/21 0944   traZODone (DESYREL) tablet 50 mg  50 mg Oral QHS PRN Chotiner, Yevonne Aline, MD   50 mg at 06/11/21 2319    REVIEW OF SYSTEMS:  On review of systems, obtained from the patient's daughter, Kevin Hobbs, the patient reports that he continues to struggle with shortness of breath.  He denies any chest pain, hemoptysis, fevers, chills, or night sweats.  He has had a persistent, dry cough.  He denies any bowel or bladder disturbances, and denies abdominal pain, nausea or vomiting.  He denies any new musculoskeletal or joint aches or pains. A complete review of systems is obtained and is otherwise negative.    PHYSICAL EXAM:  Wt Readings from Last 3 Encounters:  06/13/21 236 lb 1.8 oz (107.1 kg)  05/25/21 262 lb (118.8 kg)   Temp Readings from Last 3 Encounters:  06/16/21 97.9 F (36.6 C) (Oral)  05/27/21 99.1 F (37.3 C) (Oral)   BP Readings from Last 3 Encounters:  06/16/21 (!) 91/50  05/27/21 119/72   Pulse Readings from Last 3 Encounters:  06/16/21 99  05/27/21 97   Pain Assessment Pain Score: 0-No pain/10  Unable to assess due to telephone consult visit format.  KPS = 40  100 - Normal; no complaints; no evidence of disease. 90   - Able to carry on normal activity; minor signs or symptoms of disease. 80   - Normal activity with effort; some signs or symptoms of disease. 79   - Cares for self; unable to carry on normal activity or to do active work. 60   - Requires occasional assistance, but is able to care for most of his personal needs. 50   - Requires considerable assistance  and frequent medical care. 104   - Disabled; requires special care and assistance. 57   - Severely disabled; hospital admission is indicated although death not imminent. 64   - Very sick; hospital admission necessary; active supportive treatment necessary. 10   - Moribund; fatal processes progressing rapidly. 0      - Dead  Karnofsky DA, Abelmann [place], Craver LS and Burchenal [facility] [phone]) The use of the nitrogen mustards in the palliative treatment of carcinoma: with particular reference to bronchogenic carcinoma Cancer 1 [phone]  LABORATORY DATA:  Lab Results  Component Value Date   WBC 17.5 (H) 06/16/2021   HGB 8.9 (L) 06/16/2021   HCT 27.9 (L) 06/16/2021   MCV 87.2 06/16/2021   PLT 441 (H) 06/16/2021   Lab Results  Component Value Date   NA 140 06/16/2021   K 4.4 06/16/2021   CL 104 06/16/2021   CO2 21 (L) 06/16/2021   Lab Results  Component Value Date   ALT 30 06/16/2021   AST 55 (H) 06/16/2021   ALKPHOS 84 06/16/2021   BILITOT 1.1 06/16/2021     RADIOGRAPHY: CT ANGIO HEAD NECK W WO CM  Result Date: 06/11/2021 CLINICAL DATA:  Stroke, follow up EXAM: CT ANGIOGRAPHY HEAD AND NECK TECHNIQUE: Multidetector CT imaging of the head and neck was performed using the standard protocol during bolus administration of intravenous contrast. Multiplanar CT image reconstructions and MIPs were obtained to evaluate the vascular anatomy. Carotid stenosis measurements (when applicable) are obtained utilizing NASCET criteria, using the distal internal carotid diameter as the denominator. CONTRAST:  14m OMNIPAQUE IOHEXOL 350 MG/ML SOLN COMPARISON:  None. FINDINGS: CT HEAD FINDINGS Brain: Multiple small acute infarcts in bilateral cerebral hemispheres are better characterized on recent MRI. No evidence of interval acute large vascular territory infarct or acute hemorrhage. No mass lesion, midline shift, hydrocephalus, or extra-axial fluid collection. Patchy white matter hypoattenuation, nonspecific but likely in part related to chronic microvascular ischemic disease. Mild atrophy. Vascular: See below. Skull: No acute fracture. Sinuses: Clear sinuses. Orbits: No acute finding. Review of the MIP images confirms the above findings CTA NECK FINDINGS Aortic arch: Curve vessel origins are patent. Right carotid system: Patent.  Mild atherosclerosis of the carotid bifurcation without significant open greater than 50%) stenosis at the bifurcation. Tortuous ICA with moderate stenosis proximally due to abrupt turn/kink. Left carotid system: Atherosclerosis and irregularity at the carotid bifurcation without greater than 50% stenosis relative to the distal vessel. Vertebral arteries: Mildly left dominant. No evidence of dissection, stenosis (50% or greater) or occlusion. Mild atherosclerosis. Skeleton: Reversal of the normal cervical lordosis. Moderate multilevel degenerative disc disease in the upper cervical spine with disc height loss, endplate sclerosis and posterior disc osteophyte complexes. Other neck: There is dilation of the right laryngeal ventricle with medialization of the posterior aspect of the right vocal cord and medialization of the right arytenoid cartilage, suggestive of right vocal cord paralysis. Upper chest: Right pleural effusion, atelectasis, and right paratracheal and adenopathy, partially imaged and further characterized on recent CT chest from 05/25/2021. Review of the MIP images confirms the above findings CTA HEAD FINDINGS Anterior circulation: Right petrous ICA is patent. Bilateral cavernous and paraclinoid ICA atherosclerosis with severe stenosis versus occlusion of the cavernous right ICA with opacified paraclinoid ICA. Patent right MCA with out high-grade proximal stenosis. Left intracranial ICA is patent with multifocal moderate stenosis. Left MCA is patent without proximal high-grade stenosis. Right A1 ACA is small, probably congenital given prominent left A1 ACA. Otherwise common  bilateral ACAs are patent without proximal high-grade stenosis. No aneurysm identified. Posterior circulation: Bilateral intradural vertebral arteries, basilar artery, and posterior cerebral arteries are patent without proximal high-grade stenosis. Mild proximal right P2 PCA stenosis. No aneurysm identified. Venous sinuses: As  permitted by contrast timing, patent. Small left transverse sinus and distal transverse sinus arachnoid granulation Review of the MIP images confirms the above findings IMPRESSION: CT head: Multiple small acute infarcts in bilateral cerebral hemispheres are better characterized on recent MRI. No evidence of interval acute large vascular territory infarct or acute hemorrhage. CTA Head: 1. Severe stenosis versus occlusion of the cavernous right ICA with opacified moderately narrowed paraclinoid right ICA. Patent right MCA. 2. Multifocal moderate left intracranial ICA stenosis. 3. Mild right P2 PCA stenosis. CTA Neck: 1. Tortuous right ICA with moderate stenosis proximally due to abrupt turn/kink. 2. Otherwise, no significant (greater than 50%) stenosis in the neck. 3. 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 Recommend correlation with the presence or absence of hoarseness. 4. Right pleural effusion, atelectasis, and right paratracheal and adenopathy, partially imaged and further characterized on recent CT chest from 05/25/2021. Electronically Signed   By: Margaretha Sheffield M.D.   On: 06/11/2021 11:32   DG Chest 1 View  Result Date: 06/05/2021 CLINICAL DATA:  Status post thoracentesis. EXAM: CHEST  1 VIEW COMPARISON:  Chest radiograph dated 06/04/2021. FINDINGS: The heart is obscured. A moderate right pleural effusion with associated atelectasis/consolidation appears unchanged. There is no right pneumothorax. The left lung is clear without pleural effusion or pneumothorax. IMPRESSION: No significant change in a moderate right pleural effusion with associated atelectasis/consolidation. No pneumothorax. Electronically Signed   By: Zerita Boers M.D.   On: 06/05/2021 12:52   DG Chest 1 View  Result Date: 05/26/2021 CLINICAL DATA:  Status post right thoracentesis. EXAM: CHEST  1 VIEW COMPARISON:  One-view chest x-ray 05/25/2021 FINDINGS: Heart is enlarged. Right  pleural effusion is significantly decreased. No pneumothorax is present. Left lung is clear. Pulmonary vascular congestion noted. IMPRESSION: 1. Right pleural effusion without evidence for complication. 2. Pulmonary vascular congestion. Electronically Signed   By: San Morelle M.D.   On: 05/26/2021 15:36   DG Chest 2 View  Result Date: 06/04/2021 CLINICAL DATA:  Shortness of breath.  History of lung cancer. EXAM: CHEST - 2 VIEW COMPARISON:  Radiograph 05/27/2021.  PET CT yesterday. FINDINGS: Again seen elevation of right hemidiaphragm. Right pleural effusion and right pulmonary mass seen on PET CT yesterday. Stable heart size and mediastinal contours. Peribronchial thickening. No pneumothorax. No significant left pleural effusion. No acute osseous abnormalities are seen. Known lytic lesions on prior PET are not seen by radiograph. IMPRESSION: 1. Unchanged elevation of right hemidiaphragm. Patient with known right pulmonary mass and pleural effusion, evaluated on PET yesterday. 2. Bronchial thickening. Electronically Signed   By: Keith Rake M.D.   On: 06/04/2021 16:31   DG Abd 1 View  Result Date: 06/15/2021 CLINICAL DATA:  Constipation EXAM: ABDOMEN - 1 VIEW COMPARISON:  None. FINDINGS: There is a large stool burden in the right colon and to a lesser degree throughout the remainder of the colon and rectum. There is gaseous distention of the small bowel throughout the abdomen without evidence of mechanical obstruction. There is no gross organomegaly or abnormal soft tissue calcification. Multiple pelvic surgical clips are noted. The bones are unremarkable. IMPRESSION: Large stool burden predominately in the right colon without evidence of mechanical obstruction. Electronically Signed  By: Valetta Mole M.D.   On: 06/15/2021 16:12   CT CHEST WO CONTRAST  Result Date: 06/14/2021 CLINICAL DATA:  Normal x-ray. EXAM: CT CHEST WITHOUT CONTRAST TECHNIQUE: Multidetector CT imaging of the chest was  performed following the standard protocol without IV contrast. COMPARISON:  None. FINDINGS: Cardiovascular: There is moderate severity calcification of the aortic arch. Normal heart size with marked severity coronary artery calcification. A small pericardial effusion is noted. Mediastinum/Nodes: There is mild pretracheal lymphadenopathy. Narrowing of the proximal portions of the bilateral mainstem bronchi is seen. The thyroid gland and esophagus demonstrate no significant findings. Lungs/Pleura: Marked severity airspace disease is seen throughout the right middle lobe and right lower lobe. A small right pleural effusion is noted. There is no evidence of a pneumothorax. Upper Abdomen: No acute abnormality. Musculoskeletal: No chest wall mass or suspicious bone lesions identified. IMPRESSION: 1. Marked severity airspace disease throughout the right middle lobe and right lower lobe, consistent with pneumonia. Follow-up to resolution is recommended to exclude the presence of an underlying neoplastic process. 2. Small right pleural effusion. 3. Small pericardial effusion. 4. Narrowing of the proximal portions of the bilateral mainstem bronchi. Aortic Atherosclerosis (ICD10-I70.0). Electronically Signed   By: Virgina Norfolk M.D.   On: 06/14/2021 19:13   CT Angio Chest PE W and/or Wo Contrast  Result Date: 05/25/2021 CLINICAL DATA:  Shortness of breath EXAM: CT ANGIOGRAPHY CHEST WITH CONTRAST TECHNIQUE: Multidetector CT imaging of the chest was performed using the standard protocol during bolus administration of intravenous contrast. Multiplanar CT image reconstructions and MIPs were obtained to evaluate the vascular anatomy. CONTRAST:  13m OMNIPAQUE IOHEXOL 350 MG/ML SOLN COMPARISON:  None. FINDINGS: Cardiovascular: Examination for pulmonary embolism is limited by marginal contrast bolus, main pulmonary artery = 141 HU. Within this limitation, positive examination for pulmonary embolism, with segmental to  subsegmental embolus present in the right upper lobe (series 4, image 37). Normal heart size. Three-vessel coronary artery calcifications. No pericardial effusion. Aortic atherosclerosis. Enlargement of the main pulmonary artery measuring up to 3.6 cm in caliber. RV LV ratio is preserved, approximately 0.6. Mediastinum/Nodes: Markedly enlarged subcarinal nodes measuring at least 6.1 x 2.6 cm (series 4, image 52) thyroid gland, trachea, and esophagus demonstrate no significant findings. Lungs/Pleura: Moderate right pleural effusion associated atelectasis or consolidation. There is a large mass centered in the right middle lobe, which occludes the middle lobe segmental bronchi, although difficult to distinguish from airspace consolidation and adjacent atelectasis, this measures approximately 7.9 x 7.7 cm (series 4, image 55). Upper Abdomen: No acute abnormality. Gallstones and or sludge in the dependent gallbladder. Musculoskeletal: No chest wall abnormality. No acute or significant osseous findings. Review of the MIP images confirms the above findings. IMPRESSION: 1. Positive examination for pulmonary embolism, with segmental to subsegmental embolus present in the right upper lobe. 2. Enlargement of the main pulmonary artery, concerning for pulmonary hypertension. No elevation of the RV LV ratio. 3. There is a large mass centered in the right middle lobe, which occludes the middle lobe segmental bronchi, although difficult to distinguish from airspace consolidation and adjacent atelectasis, this measures approximately 7.9 x 7.7 cm. 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 4. Markedly enlarged subcarinal lymph nodes, concerning for nodal metastatic disease. 5. Moderate right pleural effusion and associated atelectasis or consolidation, presumably malignant, however without direct evidence of pleural metastatic disease. 6. Coronary artery disease. These results were called by telephone at the time of  interpretation on 05/25/2021 at 11:29 am to Dr.  ADAM CURATOLO , who verbally acknowledged these results. Aortic Atherosclerosis (ICD10-I70.0). Electronically Signed   By: Eddie Candle M.D.   On: 05/25/2021 11:30   MR BRAIN W WO CONTRAST  Result Date: 06/09/2021 CLINICAL DATA:  Initial evaluation for non-small cell lung cancer, staging. EXAM: MRI HEAD WITHOUT AND WITH CONTRAST TECHNIQUE: Multiplanar, multiecho pulse sequences of the brain and surrounding structures were obtained without and with intravenous contrast. CONTRAST:  15m GADAVIST GADOBUTROL 1 MMOL/ML IV SOLN COMPARISON:  None available. FINDINGS: Brain: Examination degraded by motion artifact. Diffuse prominence of the CSF containing spaces compatible with generalized age-related cerebral atrophy. Patchy and confluent T2/FLAIR hyperintensity involving the periventricular and deep white matter both cerebral hemispheres as well as the pons, most consistent with chronic small vessel ischemic disease, fairly advanced in nature. Superimposed remote lacunar infarct noted involving the left lentiform nucleus/external capsule. 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 Largest of these foci seen at the right frontal centrum semi ovale and measures up to 1 cm (series 5, image 79). Minimal scattered cortical involvement noted at the parieto-occipital regions. No associated mass effect or hemorrhage. Gray-white matter differentiation otherwise maintained. No encephalomalacia to suggest chronic cortical infarction elsewhere within the brain. No acute intracranial hemorrhage. Few scattered chronic micro hemorrhages noted involving both cerebral hemispheres, likely related to poorly controlled hypertension. No mass lesion or abnormal enhancement. No evidence for  intracranial metastatic disease. No mass effect or midline shift. No hydrocephalus or extra-axial fluid collection. Pituitary gland suprasellar region within normal limits. Midline structures intact. Vascular: Major intracranial vascular flow voids are maintained. Skull and upper cervical spine: Craniocervical junction within normal limits. Bone marrow signal intensity grossly within normal limits. No visible focal marrow replacing lesion. No scalp soft tissue abnormality. Sinuses/Orbits: Patient status post bilateral ocular lens replacement. Globes and orbital soft tissues demonstrate no acute finding. Mild scattered mucosal thickening noted about the ethmoidal air cells. Paranasal sinuses are otherwise clear. No significant mastoid effusion. Inner ear structures grossly normal. Other: None. IMPRESSION: 1. 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 No associated hemorrhage or mass effect. 2. No evidence for intracranial metastatic disease. 3. Underlying age-related cerebral atrophy with advanced chronic microvascular ischemic disease. 4. Few scattered chronic micro hemorrhages involving the supratentorial brain, likely related to chronic poorly controlled hypertension. Electronically Signed   By: BJeannine BogaM.D.   On: 06/09/2021 01:45   NM PET Image Initial (PI) Skull Base To Thigh  Result Date: 06/04/2021 CLINICAL DATA:  Initial treatment strategy for lung nodule. EXAM: NUCLEAR MEDICINE PET SKULL BASE TO THIGH TECHNIQUE: 12.9 mCi F-18 FDG was injected intravenously. Full-ring PET imaging was performed from the skull base to thigh after the radiotracer. CT data was obtained and used for attenuation correction and anatomic localization. Fasting blood glucose: 115 mg/dl COMPARISON:  Chest CTA dated May 25, 2021 FINDINGS:  Mediastinal blood pool activity: SUV max 3.1 Liver activity: SUV max 3.8. NECK: Hypermetabolic right supraclavicular lymph node measuring 7 mm in short axis on image number 39 with an SUV max of 6.8. Incidental CT findings: none CHEST: Hypermetabolic mass centered in the right upper lobe, difficult to measure, but approximately 9.4 x 9.4 cm with an SUV max of 23. Hypermetabolic right paratracheal, subcarinal and bilateral hilar  lymph nodes. Reference subcarinal lymph node measures approximately 2.0 cm in short axis with an SUV max of 11.2. Reference right hilar lymph node measures 1.5 cm in short axis on image 69 with an SUV max of 15.2. Reference left hilar lymph node measures approximately 1.4 cm in short axis with an SUV max of 10.4. Small loculated right pleural effusion with pleural FDG uptake, SUV max of 6.3. Incidental CT findings: No pericardial effusion. Calcifications of the LAD and circumflex. Atherosclerotic disease of the thoracic aorta. ABDOMEN/PELVIS: No abnormal hypermetabolic activity within the liver, pancreas, adrenal glands, or spleen. No hypermetabolic lymph nodes in the abdomen or pelvis. Incidental CT findings: Cholelithiasis and sigmoid diverticula. SKELETON: Scattered foci of osseous hypermetabolic activity which correlate with subtle lytic lesions in the right iliac bone, T12 vertebral body, lateral sixth rib, and left scapula. Reference right iliac lesion demonstrates an SUV max of 8.2. Incidental CT findings: none IMPRESSION: Large hypermetabolic mass centered in the right middle lobe which occludes the right middle lobe bronchi. Small loculated pleural effusion with pleural FDG uptake, compatible with malignant effusion. Hypermetabolic right supraclavicular, right paratracheal, subcarinal, and bilateral hilar lymph nodes. Scattered foci of osseous hypermetabolic activity which correlate with subtle lytic lesions, compatible with osseous metastatic disease. Aortic Atherosclerosis  (ICD10-I70.0). Electronically Signed   By: Yetta Glassman M.D.   On: 06/04/2021 16:20   DG CHEST PORT 1 VIEW  Result Date: 06/14/2021 CLINICAL DATA:  Follow-up pleural effusion EXAM: PORTABLE CHEST 1 VIEW COMPARISON:  06/12/2021 FINDINGS: Cardiac shadow is stable. Elevation of the right hemidiaphragm is again noted. Right-sided pleural effusion is seen with right basilar infiltrate stable from the prior exam. The vascular congestion has improved in the interval from the prior study. No bony abnormality is seen. IMPRESSION: Persistent right basilar infiltrate and effusion Electronically Signed   By: Inez Catalina M.D.   On: 06/14/2021 14:05   DG CHEST PORT 1 VIEW  Result Date: 06/12/2021 CLINICAL DATA:  Shortness of breath. EXAM: PORTABLE CHEST 1 VIEW COMPARISON:  June 11, 2021 FINDINGS: Mediastinal contour and cardiac silhouette are stable. Heart size is enlarged. Dense consolidation of the right mid and lung base are identified without change. Increased pulmonary interstitium is identified bilaterally. The bony structures are stable. IMPRESSION: Congestive heart failure. Persistent consolidation of the right lung base with right pleural effusion unchanged. Electronically Signed   By: Abelardo Diesel M.D.   On: 06/12/2021 13:49   DG CHEST PORT 1 VIEW  Result Date: 06/11/2021 CLINICAL DATA:  Dyspnea, acute on chronic respiratory failure EXAM: PORTABLE CHEST 1 VIEW COMPARISON:  06/07/2021 FINDINGS: There is dense consolidation of the a right lung base, similar to prior examination and corresponding to the known mass in this region better seen on CT examination of 05/25/2021. Left lung is clear. No pneumothorax. Small right pleural effusion is unchanged. Cardiac size is within normal limits. Pulmonary vascularity is normal. IMPRESSION: Stable right basilar consolidation corresponding to known right basilar mass and associated small right pleural effusion. Electronically Signed   By: Fidela Salisbury M.D.    On: 06/11/2021 01:47   DG CHEST PORT 1 VIEW  Result Date: 06/07/2021 CLINICAL DATA:  Right lower lung mass with component of pleural effusion. EXAM: PORTABLE CHEST 1 VIEW COMPARISON:  Chest x-rays on 06/04/2021 and 06/05/2021. PET scan on 06/03/2021. FINDINGS: Stable heart size and aortic tortuosity. Extensive mass-like density and consolidation of the right lower lung noted which has been previously demonstrated to be a large mass at the right  lung base. Component of associated right pleural fluid may be present. However, recent ultrasound did not demonstrate a large pleural effusion. No pneumothorax or pulmonary edema. IMPRESSION: Mass-like opacity and consolidation of the right lower lung. Potential associated right pleural fluid. Electronically Signed   By: Aletta Edouard M.D.   On: 06/07/2021 08:43   DG CHEST PORT 1 VIEW  Result Date: 05/27/2021 CLINICAL DATA:  Short of breath.  Right thoracentesis yesterday EXAM: PORTABLE CHEST 1 VIEW COMPARISON:  05/26/2021 FINDINGS: Persistent density in the right lung base may represent neoplasm based on CT. Pneumonia possible. Small right effusion unchanged. No pneumothorax Left lung remains clear. IMPRESSION: Persistent airspace density in the right lung base may represent tumor or pneumonia. No pneumothorax. Electronically Signed   By: Franchot Gallo M.D.   On: 05/27/2021 08:19   DG Chest Portable 1 View  Result Date: 05/25/2021 CLINICAL DATA:  Breath for 1 week, hypertension, RIGHT pleural effusion by abdominal ultrasound EXAM: PORTABLE CHEST 1 VIEW COMPARISON:  Portable exam 0920 hours without priors for comparison FINDINGS: Enlargement of cardiac silhouette with pulmonary vascular congestion. Atherosclerotic calcification aorta. RIGHT pleural effusion and basilar atelectasis versus consolidation. Minimal atelectasis at LEFT base. Upper lungs clear. No pneumothorax. Bones demineralized. IMPRESSION: Enlargement of cardiac silhouette with pulmonary vascular  congestion. RIGHT pleural effusion with basilar atelectasis and/or consolidation. Minimal LEFT basilar atelectasis. Electronically Signed   By: Lavonia Dana M.D.   On: 05/25/2021 10:03   ECHOCARDIOGRAM COMPLETE  Result Date: 05/26/2021    ECHOCARDIOGRAM REPORT   Patient Name:   Kevin Hobbs Date of Exam: 05/26/2021 Medical Rec #:  [id number]       Height:       67.0 in Accession #:    [id number]      Weight:       262.0 lb Date of Birth:  24-Jan-1941       BSA:          2.268 m Patient Age:    32 years        BP:           144/80 mmHg Patient Gender: M               HR:           88 bpm. Exam Location:  Inpatient Procedure: 2D Echo, Cardiac Doppler and Color Doppler Indications:    Pulmonary embolus  History:        Patient has no prior history of Echocardiogram examinations.                 Signs/Symptoms:Pulmonary embolus; Risk Factors:Diabetes,                 Hypertension, Former Smoker and Obesity.  Sonographer:    Dustin Flock RDCS Referring Phys: [place]  1. Left ventricular ejection fraction, by estimation, is 60 to 65%. The left ventricle has normal function. Left ventricular endocardial border not optimally defined to evaluate regional wall motion. The left ventricular internal cavity size was mildly to moderately dilated. Left ventricular diastolic parameters are indeterminate.  2. Right ventricular systolic function is normal. The right ventricular size is normal.  3. The mitral valve is normal in structure. No evidence of mitral valve regurgitation.  4. The aortic valve is grossly normal. Aortic valve regurgitation is not visualized. FINDINGS  Left Ventricle: Left ventricular ejection fraction, by estimation, is 60 to 65%. The left ventricle has normal function. Left ventricular endocardial border not optimally  defined to evaluate regional wall motion. The left ventricular internal cavity size was mildly to moderately dilated. There is no left ventricular hypertrophy.  Left ventricular diastolic parameters are indeterminate. Right Ventricle: The right ventricular size is normal. Right vetricular wall thickness was not well visualized. Right ventricular systolic function is normal. Left Atrium: Left atrial size was normal in size. Right Atrium: Right atrial size was normal in size. Pericardium: There is no evidence of pericardial effusion. Mitral Valve: The mitral valve is normal in structure. No evidence of mitral valve regurgitation. Tricuspid Valve: The tricuspid valve is grossly normal. Tricuspid valve regurgitation is mild. Aortic Valve: The aortic valve is grossly normal. Aortic valve regurgitation is not visualized. Pulmonic Valve: The pulmonic valve was grossly normal. Pulmonic valve regurgitation is not visualized. Aorta: The aortic root and ascending aorta are structurally normal, with no evidence of dilitation. IAS/Shunts: The atrial septum is grossly normal.  LEFT VENTRICLE PLAX 2D LVIDd:         5.10 cm      Diastology LVIDs:         3.20 cm      LV e' medial:    6.96 cm/s LV PW:         1.20 cm      LV E/e' medial:  9.4 LV IVS:        1.00 cm      LV e' lateral:   6.09 cm/s LVOT diam:     2.40 cm      LV E/e' lateral: 10.7 LV SV:         79 LV SV Index:   35 LVOT Area:     4.52 cm  LV Volumes (MOD) LV vol d, MOD A4C: 157.0 ml LV vol s, MOD A4C: 66.8 ml LV SV MOD A4C:     157.0 ml RIGHT VENTRICLE RV Basal diam:  2.50 cm RV S prime:     16.80 cm/s TAPSE (M-mode): 2.4 cm LEFT ATRIUM             Index       RIGHT ATRIUM           Index LA diam:        3.10 cm 1.37 cm/m  RA Area:     13.80 cm LA Vol (A2C):   65.9 ml 29.05 ml/m RA Volume:   36.60 ml  16.14 ml/m LA Vol (A4C):   44.3 ml 19.53 ml/m LA Biplane Vol: 54.5 ml 24.03 ml/m  AORTIC VALVE LVOT Vmax:   110.00 cm/s LVOT Vmean:  64.400 cm/s LVOT VTI:    0.174 m  AORTA Ao Root diam: 2.90 cm MITRAL VALVE MV Area (PHT): 3.33 cm    SHUNTS MV Decel Time: 228 msec    Systemic VTI:  0.17 m MV E velocity: 65.10 cm/s   Systemic Diam: 2.40 cm MV A velocity: 78.40 cm/s MV E/A ratio:  0.83 Mertie Moores MD Electronically signed by Mertie Moores MD Signature Date/Time: 05/26/2021/1:17:37 PM    Final    ECHOCARDIOGRAM LIMITED BUBBLE STUDY  Result Date: 06/10/2021    ECHOCARDIOGRAM LIMITED REPORT   Patient Name:   Kevin Hobbs Date of Exam: 06/10/2021 Medical Rec #:  [id number]       Height:       67.0 in Accession #:    [id number]      Weight:       233.0 lb Date of Birth:  September 01, 1941       BSA:  2.158 m Patient Age:    59 years        BP:           132/59 mmHg Patient Gender: M               HR:           103 bpm. Exam Location:  Inpatient Procedure: Limited Echo and Saline Contrast Bubble Study Indications:    Stroke 434.91 / I63.9  History:        Patient has prior history of Echocardiogram examinations, most                 recent 05/26/2021. Risk Factors:Diabetes and Hypertension.  Sonographer:    Bernadene Person RDCS Referring Phys: [id number] [place]  1. Limited study to evaluate for interatrial shunt  2. Agitated saline contrast bubble study was negative, with no evidence of any interatrial shunt. Technically difficult study, but no shunting seen FINDINGS  IAS/Shunts: Agitated saline contrast was given intravenously to evaluate for intracardiac shunting. Agitated saline contrast bubble study was negative, with no evidence of any interatrial shunt. Oswaldo Milian MD Electronically signed by Oswaldo Milian MD Signature Date/Time: 06/10/2021/8:36:09 PM    Final    VAS [place] LOWER EXTREMITY VENOUS (DVT)  Result Date: 05/27/2021  Lower Venous DVT Study Patient Name:  Kevin Hobbs  Date of Exam:   05/27/2021 Medical Rec #: [id number]        Accession #:    [id number] Date of Birth: September 12, 1941        Patient Gender: M Patient Age:   76 years Exam Location:  [facility] Procedure:      VAS [place] LOWER EXTREMITY VENOUS (DVT) Referring Phys: Brand Males  --------------------------------------------------------------------------------  Indications: Pulmonary embolism. Other Indications: Incrasing shortness of breath x1 week. Comparison Study: No previous exams Performing Technologist: Jody Hill RVT, RDMS  Examination Guidelines: A complete evaluation includes B-mode imaging, spectral Doppler, color Doppler, and power Doppler as needed of all accessible portions of each vessel. Bilateral testing is considered an integral part of a complete examination. Limited examinations for reoccurring indications may be performed as noted. The reflux portion of the exam is performed with the patient in reverse Trendelenburg.  +---------+---------------+---------+-----------+----------+--------------+ RIGHT    CompressibilityPhasicitySpontaneityPropertiesThrombus Aging +---------+---------------+---------+-----------+----------+--------------+ CFV      Full           Yes      Yes                                 +---------+---------------+---------+-----------+----------+--------------+ SFJ      Full                                                        +---------+---------------+---------+-----------+----------+--------------+ FV Prox  Full           Yes      Yes                                 +---------+---------------+---------+-----------+----------+--------------+ FV Mid   Full           Yes      Yes                                 +---------+---------------+---------+-----------+----------+--------------+  FV DistalFull           Yes      Yes                                 +---------+---------------+---------+-----------+----------+--------------+ PFV      Full                                                        +---------+---------------+---------+-----------+----------+--------------+ POP      Full           Yes      Yes                                  +---------+---------------+---------+-----------+----------+--------------+ PTV      Full                                                        +---------+---------------+---------+-----------+----------+--------------+ PERO     Full                                                        +---------+---------------+---------+-----------+----------+--------------+   +---------+---------------+---------+-----------+----------+--------------+ LEFT     CompressibilityPhasicitySpontaneityPropertiesThrombus Aging +---------+---------------+---------+-----------+----------+--------------+ CFV      Full           Yes      Yes                                 +---------+---------------+---------+-----------+----------+--------------+ SFJ      Full                                                        +---------+---------------+---------+-----------+----------+--------------+ FV Prox  Full           Yes      Yes                                 +---------+---------------+---------+-----------+----------+--------------+ FV Mid   Full           Yes      Yes                                 +---------+---------------+---------+-----------+----------+--------------+ FV DistalFull           Yes      Yes                                 +---------+---------------+---------+-----------+----------+--------------+ PFV      Full                                                        +---------+---------------+---------+-----------+----------+--------------+  POP      Full           Yes      Yes                                 +---------+---------------+---------+-----------+----------+--------------+ PTV      Full                                                        +---------+---------------+---------+-----------+----------+--------------+ PERO     None           No       No                   Acute           +---------+---------------+---------+-----------+----------+--------------+     Summary: BILATERAL: - No evidence of superficial venous thrombosis in the lower extremities, bilaterally. -No evidence of popliteal cyst, bilaterally. RIGHT: - There is no evidence of deep vein thrombosis in the lower extremity.  LEFT: - Findings consistent with acute deep vein thrombosis involving the left peroneal veins.  *See table(s) above for measurements and observations. Electronically signed by Jamelle Haring on 05/27/2021 at 5:52:25 PM.    Final    US THORACENTESIS ASP PLEURAL SPACE W/IMG GUIDE  Result Date: 06/05/2021 INDICATION: Patient with history of lung cancer, pulmonary embolus, malignant right pleural effusion. Request received for diagnostic and therapeutic right thoracentesis. EXAM: ULTRASOUND GUIDED DIAGNOSTIC AND THERAPEUTIC RIGHT THORACENTESIS MEDICATIONS: 1% lidocaine to skin and subcutaneous tissue COMPLICATIONS: None immediate. PROCEDURE: An ultrasound guided thoracentesis was thoroughly discussed with the patient and questions answered. The benefits, risks, alternatives and complications were also discussed. The patient understands and wishes to proceed with the procedure. Written consent was obtained. Ultrasound was performed to localize and mark an adequate pocket of fluid in the right chest. The area was then prepped and draped in the normal sterile fashion. 1% Lidocaine was used for local anesthesia. Under ultrasound guidance a 6 Fr Safe-T-Centesis catheter was introduced. Thoracentesis was performed. The catheter was removed and a dressing applied. FINDINGS: A total of approximately 20 cc of amber fluid was removed. Samples were sent to the laboratory as requested by the clinical team. Minimal free fluid was noted on today's ultrasound of right pleural space. Despite catheter manipulation only the above amount of fluid could be removed today. IMPRESSION: Successful ultrasound guided diagnostic and  therapeutic right thoracentesis yielding 20 cc of pleural fluid. Read by: Rowe Robert, PA-C Electronically Signed   By: Aletta Edouard M.D.   On: 06/05/2021 12:39   US THORACENTESIS ASP PLEURAL SPACE W/IMG GUIDE  Result Date: 05/26/2021 INDICATION: Patient with history of right lung mass, pulmonary embolus, dyspnea, right pleural effusion. Request received for diagnostic and therapeutic right thoracentesis. EXAM: ULTRASOUND GUIDED DIAGNOSTIC AND THERAPEUTIC RIGHT THORACENTESIS MEDICATIONS: 1% lidocaine to skin and subcutaneous tissue COMPLICATIONS: None immediate. PROCEDURE: An ultrasound guided thoracentesis was thoroughly discussed with the patient and questions answered. The benefits, risks, alternatives and complications were also discussed. The patient understands and wishes to proceed with the procedure. Written consent was obtained. Ultrasound was performed to localize and mark an adequate pocket of fluid in the right chest. The area was then prepped and draped in the normal sterile fashion. 1%  Lidocaine was used for local anesthesia. Under ultrasound guidance a 6 Fr Safe-T-Centesis catheter was introduced. Thoracentesis was performed. The catheter was removed and a dressing applied. FINDINGS: A total of approximately 1.3 liters of blood-tinged fluid was removed. Samples were sent to the laboratory as requested by the clinical team. IMPRESSION: Successful ultrasound guided diagnostic and therapeutic right thoracentesis yielding 1.3 liters of pleural fluid. Read by: Rowe Robert, PA-C Electronically Signed   By: Markus Daft M.D.   On: 05/26/2021 15:10      IMPRESSION/PLAN:  This visit was conducted via telephone to spare the patient unnecessary potential exposure in the healthcare setting during the current COVID-19 pandemic. 1. 80 y.o. 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  Today,  I spoke with the patient and his daughter, Kevin Hobbs, about the findings and workup thus far. We discussed the natural history of metastatic non-small cell lung carcinoma and general treatment, highlighting the role of palliative radiotherapy in the management of obstructive disease. We discussed the available radiation techniques, and focused on the details and logistics of delivery.  The recommendation is for a 2-week course of daily radiotherapy to the obstructing lung mass in an effort to alleviate the obstruction and improve the patient's breathing.  We discussed that this would not be a curative approach to his care but instead, an effort to improve his quality of life for what ever time he has remaining.  We reviewed the anticipated acute and late sequelae associated with radiation in this setting. The patient and his daughter were encouraged to ask questions that were answered to their stated satisfaction.  At the conclusion of our conversation, the patient's daughter, Kevin Hobbs, wished to have further conversation with her father and include her mother in the discussion prior to making a final decision.  I provided my contact information and advised them to notify me if they would like to proceed with the palliative radiotherapy.  In that case, we would request that he be transferred back to [facility], if he is stable, for ease of coordinating the treatment planning and daily treatments to begin as soon as possible.  They know that they are welcome to call anytime with any further questions or concerns related to the radiotherapy.  I personally spent 70 minutes in this encounter including chart review, reviewing radiological studies, telephone discussion with the patient and family, entering orders and completing documentation.    Nicholos Johns, PA-C   Jodelle Gross, MD, PhD   [facility]  Radiation Oncology Direct Dial: [phone]  Fax: [phone] [email or web address]  Skype   LinkedIn

## 2021-06-16 NOTE — Progress Notes (Signed)
At start of shift, pt sob, RR 24-30s. Nebulizer given without relief. Temp 100.6 and mews red. RT notified and at bedside to assess. MD notified, ABG ordered then bipap ordered and MD at bedside. Pt tolerated bipap well overnight. RR remains >25-30s but not as labored. Temp elevated overnight, tylenol given and cool washcloths applied. Around 0530, bipap removed and given tylenol. Pt on 4Lnc, resps unlabored and less tachypnic, lung sounds less coarse. Placed back on bipap.  Wife stayed the night in recliner and also updated on care throughout night.

## 2021-06-16 NOTE — Progress Notes (Signed)
Daily Progress Note   Patient Name: Kevin Hobbs       Date: 06/16/2021 DOB: [birth year]  Age: 80 y.o. MRN#: [id number] Attending Physician: Shawna Clamp, MD Primary Care Physician: Robyne Peers, MD Admit Date: 06/04/2021  Reason for Consultation/Follow-up: Establishing goals of care  Subjective: Chart review performed. Received report from primary RN - no acute concerns. RN does report patient utilized BiPap last night. Patient has been up to chair and tolerated well, but does continue with poor appetite.   Went to visit patient at bedside - wife/[name] present. Patient was lying in bed awake, alert, oriented, but only able to participate in minimal conversation. He is on 3L O2 Loop. No signs or non-verbal gestures of pain or discomfort noted. No respiratory distress, increased work of breathing, or secretions noted. His breathing and mentation is notably improved from yesterday. He denies pain or shortness of breath at this time.  Therapeutic listening and emotional support provided to [name] as she reflects on the last several weeks. She explains that herself, daughter/[name], and patient were able to have a good conversation about his [place]. Patient has requested to be DNR/DNI and does not wish to pursue radiation. [name] tells me he really wants to "go home," but does express concern about PT recommending rehab at discharge. She tells me "every moment is precious" and does not want patient to spend several of his last weeks at rehab away from family. Validated that time is incredibly important right now and rehab does not sound like it fits in with their goals. We also reviewed that rehab, at this point, would likely offer little to no benefit for patient as it is anticipated he will  continue to decline despite rehabilitation as well as not changing his overall outcome. [name] agrees and does not wish patient discharge to rehab. Provided education and counseling at length on the philosophy and benefits of hospice care. Discussed that it offers a holistic approach to care in the setting of end-stage illness, and is about supporting the patient where they are allowing nature to take it's course. Discussed the hospice team includes RNs, physicians, social workers, and chaplains. They can provide personal care, support for the family, and help keep patient out of the hospital as well as assist with DME needs for home hospice. Education provided  on the difference between home vs residential hospice. [name] feels home hospice fits best with their goals and tells me that family have no issues/concerns about caring for patient at home.   Discussed goals and values for care while patient is still in house -continuing current medical interventions vs transition to comfort care in house. Education provided on current medical interventions. After discussion, [name] would like patient to continue current treatment until antibiotic course is completed. DME also discussed in context of discharge. [name] states that hospital bed is supposed to be delivered to the home today. She states that once antibiotic course is completed and DME delivered, family will be ready for patient to return home.   [name] explains they do not have a hospice organization preference, but note the do live in [facility].   All questions and concerns addressed. Encouraged to call with questions and/or concerns. PMT card provided.  Length of Stay: 12  Current Medications: Scheduled Meds:   amLODipine  10 mg Oral Daily   apixaban  5 mg Oral BID   aspirin EC  81 mg Oral Daily   feeding supplement  237 mL Oral BID BM   insulin aspart  0-15 Units Subcutaneous TID WC   insulin aspart  0-5 Units Subcutaneous QHS   mouth  rinse  15 mL Mouth Rinse BID   mirtazapine  7.5 mg Oral QHS   multivitamin with minerals  1 tablet Oral Daily   pantoprazole  40 mg Oral Daily   simvastatin  10 mg Oral Daily    Continuous Infusions:  piperacillin-tazobactam (ZOSYN)  IV 3.375 g (06/16/21 0950)    PRN Meds: acetaminophen **OR** acetaminophen, albuterol, ALPRAZolam, chlorpheniramine-HYDROcodone, guaiFENesin-dextromethorphan, traZODone  Physical Exam Vitals and nursing note reviewed.  Constitutional:      General: He is not in acute distress.    Appearance: He is ill-appearing.  Pulmonary:     Effort: No respiratory distress.  Skin:    General: Skin is warm and dry.  Neurological:     Mental Status: He is alert and oriented to person, place, and time.     Motor: Weakness present.  Psychiatric:        Attention and Perception: Attention normal.        Speech: Speech is delayed.        Behavior: Behavior is slowed. Behavior is cooperative.        Cognition and Memory: Cognition and memory normal.            Vital Signs: BP (!) 91/50 (BP Location: Left Arm)   Pulse 99   Temp 97.9 F (36.6 C) (Oral)   Resp (!) 31   Ht 5\' 7"  (1.702 m)   Wt 107.1 kg   SpO2 98%   BMI 36.98 kg/m  SpO2: SpO2: 98 % O2 Device: O2 Device: Nasal Cannula O2 Flow Rate: O2 Flow Rate (L/min): 4 L/min  Intake/output summary:  Intake/Output Summary (Last 24 hours) at 06/16/2021 1439 Last data filed at 06/16/2021 0526 Gross per 24 hour  Intake 255.49 ml  Output 600 ml  Net -344.51 ml   LBM: Last BM Date: 06/12/21 Baseline Weight: Weight: 107 kg Most recent weight: Weight: 107.1 kg       Palliative Assessment/Data: PPS 40-50%      Patient Active Problem List   Diagnosis Date Noted   Cerebrovascular accident (CVA) ([place])    Pleural effusion    SOB (shortness of breath)    Diabetes mellitus without complication ([place])  Essential hypertension    Hyperkalemia    GERD (gastroesophageal reflux disease)    Acute on chronic  respiratory failure with hypoxemia ([place]) 06/04/2021   Pulmonary embolism ([place]) 05/25/2021   Pulmonary emboli ([place]) 05/25/2021    Palliative Care Assessment & Plan   Patient Profile: 80 y.o. male  with past medical history of recently diagnosed acute pulmonary embolism, right pleural effusion, right middle lobe lung mass, recently diagnosed acute left lower extremity DVT, and type 2 diabetes. He was admitted to [facility] hospital on 06/04/2021 with acute on chronic hypoxic respiratory failure after presenting from home to [facility] emergency department complaining of shortness of breath.    Patient was recently hospitalized 05/25/21 - 05/27/21 at [facility] for acute hypoxic respiratory failure. Work-up included CTA chest which showed evidence of acute pulmonary embolism, right pleural effusion, and right middle lobe lung mass (which was reportedly new). 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   PET scan was done 06/03/21 and showed evidence of metastatic disease.  Assessment: Acute on chronic hypoxic respiratory failure Post obstructive pneumonia Pulmonary embolism Right middle lobe carcinoma, malignant pleural effusion Acute CVA, incidental Acute kidney injury Swallow dysfunction COPD exacerbation  Recommendations/Plan: Continue to medically optimize - goal is to complete antibiotic course before discharging Initiated DNR/DNI - durable DNR form completed and placed in shadow chart. Copy was made and will be scanned into [facility]yn[employer]/ACP tab Goal is for discharge home with hospice. Patient has opted not to pursue radiation Wife will be ready for patient discharge once antibiotic course completed and DME has been delivered [facility] consulted for: home hospice referral PMT will continue to follow and support holistically   Goals of Care and Additional Recommendations: Limitations on Scope of Treatment: No Artificial Feeding, No Radiation, No  Surgical Procedures, and No Tracheostomy  Code Status:    Code Status Orders  (From admission, onward)           Start     Ordered   06/16/21 1431  Do not attempt resuscitation (DNR)  Continuous       Question Answer Comment  In the event of cardiac or respiratory ARREST Do not call a "code blue"   In the event of cardiac or respiratory ARREST Do not perform Intubation, CPR, defibrillation or ACLS   In the event of cardiac or respiratory ARREST Use medication by any route, position, wound care, and other measures to relive pain and suffering. May use oxygen, suction and manual treatment of airway obstruction as needed for comfort.      06/16/21 1430           Code Status History     Date Active Date Inactive Code Status Order ID Comments User Context   06/04/2021 1901 06/16/2021 1430 Full Code [id number]  Rhetta Mura DO ED   05/25/2021 2005 05/27/2021 2109 Full Code [id number]  Oswald Hillock, MD Inpatient      Advance Directive Documentation    Flowsheet Row Most Recent Value  Type of Advance Directive Healthcare Power of Attorney, Living will  Pre-existing out of facility DNR order (yellow form or pink MOST form) --  "MOST" Form in Place? --       Prognosis:  < 6 weeks  Discharge Planning: Home with Hospice  Care plan was discussed with primary RN, patient and wife, TOC, Dr. Dwyane Dee  Thank you for allowing the Palliative Medicine Team to assist in the care of this patient.   Total  Time 67 minutes Prolonged Time Billed  yes       Greater than 50%  of this time was spent counseling and coordinating care related to the above assessment and plan.  Lin Landsman, NP  Please contact Palliative Medicine Team phone at [phone] for questions and concerns.

## 2021-06-16 NOTE — Progress Notes (Signed)
Occupational Therapy Treatment Patient Details Name: Kevin Hobbs MRN: [id number] DOB: 1941-08-11 Today's Date: 06/16/2021   History of present illness This 80 years old male with PMH significant for recently diagnosed acute pulmonary embolism, DVT,  pleural effusion and right middle lobe lung mass on , type 2 diabetes presented in the ED 06/04/21 with  acute hypoxic respiratory failure, For further staging lung cancer, MRI was done 9/06  which showed scattered embolic strokes involving bilateral anterior and posterior circulation. On 2 L home O2.   OT comments  Pt received in bed, agreeable to transfer to recliner in preparation for lunch. Pt reports feeling better after using cpap/bipap last night. Pt limited due to decreased activity tolerance, generalized weakness and deconditioning, cognition, and orthostatic BP (see below). Pt required modA to progress to EOB. He required minA to stand from elevated surface and minA to pivot to recliner.   SpO2 >92% on 4lnc, HR 99bpm-104bpm, BP 97/70 sitting EOB, asymptomatic;BP 84/52 following stand-pivot to recliner, reports of dizziness, resolved with legs elevated.   Due to limitations listed above, pt may need updated plan of care to SNF for short term rehab pending palliative care plan. Will continue to follow and progress plan appropriately.    Recommendations for follow up therapy are one component of a multi-disciplinary discharge planning process, led by the attending physician.  Recommendations may be updated based on patient status, additional functional criteria and insurance authorization.    Follow Up Recommendations  Supervision/Assistance - 24 hour;Home health OT (SNF if family chooses not to proceed with hospice/comfort measures.)    Equipment Recommendations  3 in 1 bedside commode;Hospital bed;Wheelchair (measurements OT)    Recommendations for Other Services      Precautions / Restrictions Precautions Precautions: Fall;Other  (comment) Precaution Comments: monitor sats Restrictions Weight Bearing Restrictions: No       Mobility Bed Mobility Overal bed mobility: Needs Assistance Bed Mobility: Sit to Supine     Supine to sit: HOB elevated;Mod assist     General bed mobility comments: modA to progress BLE to EOB and trunk to upright position    Transfers Overall transfer level: Needs assistance Equipment used: Rolling walker (2 wheeled) Transfers: Sit to/from [facility] Sit to Stand: Min assist;From elevated surface Stand pivot transfers: Min assist       General transfer comment: minA for powerup, cues for safe hand placement and cues for sequencing.    Balance Overall balance assessment: Needs assistance Sitting-balance support: Feet supported;No upper extremity supported Sitting balance-Leahy Scale: Good     Standing balance support: Bilateral upper extremity supported;During functional activity Standing balance-Leahy Scale: Fair Standing balance comment: heavy reliance on BUE support                           ADL either performed or assessed with clinical judgement   ADL Overall ADL's : Needs assistance/impaired     Grooming: Set up;Sitting               Lower Body Dressing: Moderate assistance Lower Body Dressing Details (indicate cue type and reason): to access BLE to don socks Toilet Transfer: Minimal assistance;Stand-pivot;RW Toilet Transfer Details (indicate cue type and reason): simulated from EOB to recliner Toileting- Clothing Manipulation and Hygiene: Minimal assistance;Sit to/from stand Toileting - Clothing Manipulation Details (indicate cue type and reason): minA for management of hospital gown     Functional mobility during ADLs: Minimal assistance;Rolling walker General ADL Comments: SpO2 >  92% on 4lnc with exertion, pt limited by deconditioning, generalized weakness, decreaed activity tolerance     Vision       Perception      Praxis      Cognition Arousal/Alertness: Lethargic Behavior During Therapy: Flat affect Overall Cognitive Status: Difficult to assess                                 General Comments: pt requiring increased time to process information, pt following commands consistently;pt soft spoken        Exercises     Shoulder Instructions       General Comments SpO2 >92% on 4lnc, HR 99bpm-104bpm, BP 97/70 sitting EOB, asymptomatic;BP 84/52 following stand-pivot to recliner, reports of dizziness, resolved with legs elevated    Pertinent Vitals/ Pain       Pain Assessment: No/denies pain Pain Intervention(s): Monitored during session  Home Living                                          Prior Functioning/Environment              Frequency  Min 2X/week        Progress Toward Goals  OT Goals(current goals can now be found in the care plan section)  Progress towards OT goals: Progressing toward goals  Acute Rehab OT Goals Patient Stated Goal: get stronger OT Goal Formulation: With patient/family Time For Goal Achievement: 06/06/2021 Potential to Achieve Goals: Good ADL Goals Pt Will Transfer to Toilet: with supervision;ambulating Pt Will Perform Toileting - Clothing Manipulation and hygiene: with supervision;sit to/from stand Additional ADL Goal #1: Patient will stand at sink to perform grooming task as evidence of improving activity tolerance  Plan Discharge plan remains appropriate    Co-evaluation                 AM-PAC OT "6 Clicks" Daily Activity     Outcome Measure   Help from another person eating meals?: A Little Help from another person taking care of personal grooming?: A Little Help from another person toileting, which includes using toliet, bedpan, or urinal?: A Lot Help from another person bathing (including washing, rinsing, drying)?: A Lot Help from another person to put on and taking off regular upper body  clothing?: A Little Help from another person to put on and taking off regular lower body clothing?: A Lot 6 Click Score: 15    End of Session Equipment Utilized During Treatment: Rolling walker;Oxygen;Gait belt  OT Visit Diagnosis: Unsteadiness on feet (R26.81);Muscle weakness (generalized) (M62.81)   Activity Tolerance Patient limited by fatigue   Patient Left in chair;with call bell/phone within reach;with family/visitor present;with chair alarm set   Nurse Communication Mobility status (BP)        Time: 6378-5885 OT Time Calculation (min): 40 min  Charges: OT General Charges $OT Visit: 1 Visit OT Treatments $Self Care/Home Management : 38-52 mins  Helene Kelp OTR/L Acute Rehabilitation Services Office: [place] 06/16/2021, 12:19 PM

## 2021-06-16 NOTE — TOC Progression Note (Signed)
Transition of Care [facility]) - Progression Note    Patient Details  Name: Kevin Hobbs MRN: [id number] Date of Birth: December 20, 1940  Transition of Care [facility]) CM/SW Contact  Pollie Friar, RN Phone Number: 06/16/2021, 3:04 PM  Clinical Narrative:    CM received information from palliative care that the patient has decided to transition to hospice care. CM met with the patient and spouse and provided choice under medicare. Gov. She selected [facility]. Moia with [facility] is reviewing the referral for admission and will reach out to the patients spouse.  Pt has bed that is being delivered to the home today through [facility]. He already has a CPAP and oxygen at home.  Wife requesting: 3 in 1, table, wheelchair and suction for home. Moia aware.  TOC following.    Expected Discharge Plan: Home w Hospice Care Barriers to Discharge: Continued Medical Work up  Expected Discharge Plan and Services Expected Discharge Plan: [place]         Expected Discharge Date: 06/09/21               DME Arranged: Hospital bed DME Agency: [employer]       HH Arranged: RN, PT [facility] Agency: [place] ([facility]) Date HH Agency Contacted: 06/09/21 Time [place]: 1414 Representative spoke with at [place]: [place] ([place]) Interventions    Readmission Risk Interventions No flowsheet data found.

## 2021-06-17 DIAGNOSIS — G479 Sleep disorder, unspecified: Secondary | ICD-10-CM

## 2021-06-17 LAB — BASIC METABOLIC PANEL
Anion gap: 13 (ref 5–15)
BUN: 42 mg/dL — ABNORMAL HIGH (ref 8–23)
CO2: 19 mmol/L — ABNORMAL LOW (ref 22–32)
Calcium: 8.6 mg/dL — ABNORMAL LOW (ref 8.9–10.3)
Chloride: 108 mmol/L (ref 98–111)
Creatinine, Ser: 1.41 mg/dL — ABNORMAL HIGH (ref 0.61–1.24)
GFR, Estimated: 51 mL/min — ABNORMAL LOW (ref >60)
Glucose, Bld: 190 mg/dL — ABNORMAL HIGH (ref 70–99)
Potassium: 3.9 mmol/L (ref 3.5–5.1)
Sodium: 140 mmol/L (ref 135–145)

## 2021-06-17 LAB — CBC
HCT: 27.3 % — ABNORMAL LOW (ref 39.0–52.0)
Hemoglobin: 8.6 g/dL — ABNORMAL LOW (ref 13.0–17.0)
MCH: 27.4 pg (ref 26.0–34.0)
MCHC: 31.5 g/dL (ref 30.0–36.0)
MCV: 86.9 fL (ref 80.0–100.0)
Platelets: 464 10*3/uL — ABNORMAL HIGH (ref 150–400)
RBC: 3.14 MIL/uL — ABNORMAL LOW (ref 4.22–5.81)
RDW: 17.3 % — ABNORMAL HIGH (ref 11.5–15.5)
WBC: 19 10*3/uL — ABNORMAL HIGH (ref 4.0–10.5)
nRBC: 0 % (ref 0.0–0.2)

## 2021-06-17 LAB — GLUCOSE, CAPILLARY
Glucose-Capillary: 174 mg/dL — ABNORMAL HIGH (ref 70–99)
Glucose-Capillary: 204 mg/dL — ABNORMAL HIGH (ref 70–99)
Glucose-Capillary: 202 mg/dL — ABNORMAL HIGH (ref 70–99)
Glucose-Capillary: 119 mg/dL — ABNORMAL HIGH (ref 70–99)

## 2021-06-17 MED ORDER — LORAZEPAM 0.5 MG PO TABS
0.5000 mg | ORAL_TABLET | Freq: Every day | ORAL | Status: DC
  Administered 2021-06-17: 0.5 mg via ORAL
  Filled 2021-06-17: qty 1

## 2021-06-17 NOTE — Progress Notes (Signed)
   Pt has been reviewed and we have spoke to family about hospice services. He has been presented to or [employer] and approved for hospice services at home.   We have have ordered a BSC, over bed table, nebulizer, suction machine and transport W/C. We switched out his bed for a fully electric bed and his 5 L concentrator for a 10L concentrator in event that he  would require increased amounts.   The equipment is in the home and the wife is hoping that pt will finish his antibiotic therapy today and d/c home tomorrow.   I am waiting to hear back from the Case Manager on the d/c plan. Webb Silversmith RN [phone]

## 2021-06-17 NOTE — Progress Notes (Signed)
PROGRESS NOTE    EMMITTE SURGEON  FMB:[id number] DOB: [birth year] DOA: 06/04/2021 PCP: Robyne Peers, MD   Brief Narrative:  This 80 year old male with PMH significant for  type 2 diabetes mellitus, who presented with recurrent dyspnea.  He had a recent hospitalization from 8/23-8/25/2022 when he was diagnosed with pulmonary embolism, right pleural effusion and right middle lobe lung mass. He underwent thoracentesis and 1.3 L of fluid was removed.  At home he had progressive recurrent shortness of breath associated with cough and orthopnea.  Because of persistent and worsening symptoms he came back to the hospital.  He was admitted for acute on chronic hypoxic respiratory failure and  was placed on supplemental oxygen per nasal cannula. He underwent  repeat  thoracentesis 22 cc of amber fluid was obtained. He has received antibiotic therapy for possible post-obstructive pneumonia with improvement of his symptoms. Brain MRI was performed as part of lung cancer staging , finding new CVA bilateral. Neurology was consulted with recommendations for transfer to [facility] for further neurologic workup.  Patient has decided to be DNR/DNI and would not want to receive palliative radiation therapy.   Patient is being discharged to home with home hospice tomorrow.    Assessment & Plan:   Principal Problem:   Acute on chronic respiratory failure with hypoxemia (HCC) Active Problems:   Pulmonary embolism (HCC)   Pleural effusion   SOB (shortness of breath)   Diabetes mellitus without complication (HCC)   Essential hypertension   Hyperkalemia   GERD (gastroesophageal reflux disease)   Cerebrovascular accident (CVA) ([place])   Acute on chronic hypoxic respiratory failure sec. to COPD and postobstructive pneumonia. He initially received treatment with 5 days of azithromycin and prednisone along with albuterol as needed.  Speech therapy recommended dysphagia 3 diet. Patient has worsening cough with fever of  102.1 F. Chest x-ray on 06/11/2021 notable for stable right basilar consolidation, right basilar mass and associated small right pleural effusion.   Given concerns of developing pneumonia, patient was started on IV ceftriaxone, Flagyl.   Despite abx, patient continued to have progressively worsening respiratory status. F/u CT chest on 9/13 reviewed, notable for significant post-obstructive pneumonia over R middle and lower lung fields Initiated on IV Zithromax, Pulmonary consulted due to increased work of breathing. Recommend broadening to zosyn, Rad-Onc consultation. Continue BiPAP at night and as needed. Discussed with Radiation Oncology.  Patient does not want to receive palliative radiation therapy.   He is requesting to go home on hospice at this time. All the equipments has been delivered to the patient's home.  Patient will be discharged tomorrow.    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  No evidence of intracranial metastatic disease. Few scattered chronic microhemorrhages involving the soft tentorial brain. Echocardiogram from 08/22 had no intracavitary thrombus. Dr. Cheral Marker from neurology has recommended patient to be transfer to [facility] for stroke team evaluation. No evidence of intracardiac shunt.  Continue on anticoagulation with apixaban. Cont with statin. Seems to be stable neurologically at this time.   Acute pulmonary embolism.  Continue with Eliquis.   Acute kidney injury: >  Improved. Recently given IV Lasix with subsequent creatinine in excess of 1.6.   Losartan currently remains on hold.  Patient received IV fluid bolus.  Functions improving and back to baseline. Repeat BMP in  a.m.   Type 2 diabetes: Metformin on hold Continue sliding scale insulin as needed   Hypertension : Hold losartan, continue amlodipine.   GERD;  continue PPI   Depression: Pt was prescribed escitalopram prior to admit and reportedly had not taken yet, currently on hold    DVT prophylaxis: Eliquis Code Status: DNR/DNI Family Communication: Wife at bedside Disposition Plan:   Status is: Inpatient  Remains inpatient appropriate because:Inpatient level of care appropriate due to severity of illness  Dispo: The patient is from: Home              Anticipated d/c is to: Home with home hospice in am.              Patient currently is not medically stable to d/c.   Difficult to place patient No   Consultants:  Pulmonology Neurology Palliative care.  Procedures:  Bipap Antimicrobials:   Anti-infectives (From admission, onward)    Start     Dose/Rate Route Frequency Ordered Stop   06/15/21 1545  piperacillin-tazobactam (ZOSYN) IVPB 3.375 g        3.375 g 12.5 mL/hr over 240 Minutes Intravenous Every 8 hours 06/15/21 1448     06/15/21 0830  azithromycin (ZITHROMAX) 500 mg in sodium chloride 0.9 % 250 mL IVPB  Status:  Discontinued        500 mg 250 mL/hr over 60 Minutes Intravenous Every 24 hours 06/15/21 0743 06/15/21 1430   06/11/21 1445  cefTRIAXone (ROCEPHIN) 1 g in sodium chloride 0.9 % 100 mL IVPB  Status:  Discontinued        1 g 200 mL/hr over 30 Minutes Intravenous Every 24 hours 06/11/21 1353 06/15/21 1430   06/11/21 1445  metroNIDAZOLE (FLAGYL) IVPB 500 mg        500 mg 100 mL/hr over 60 Minutes Intravenous Every 12 hours 06/11/21 1354 06/14/21 0400   06/08/21 1000  azithromycin (ZITHROMAX) tablet 500 mg        500 mg Oral Daily 06/07/21 0905 06/09/21 1058   06/05/21 0300  azithromycin (ZITHROMAX) 500 mg in sodium chloride 0.9 % 250 mL IVPB  Status:  Discontinued        500 mg 250 mL/hr over 60 Minutes Intravenous Every 24 hours 06/05/21 0231 06/07/21 0905         Subjective: Patient was seen and examined at bedside.  Overnight events noted.   Patient reports feeling better.  Wife is at bedside.   Patient is lying comfortably on the bed with 2 L/ min oxygen sats 94%.  He wants to be discharged tomorrow.  Objective: Vitals:   06/17/21 0006 06/17/21 0359 06/17/21 0848 06/17/21 1257  BP: (!) 115/57 (!) 126/6 130/68 (!) 113/56  Pulse: 86  (!) 112 (!) 116  Resp:  (!) 24  18  Temp:  99.1 F (37.3 C)    TempSrc:  Axillary    SpO2:   96% 99%  Weight:      Height:        Intake/Output Summary (Last 24 hours) at 06/17/2021 1313 Last data filed at 06/16/2021 1825 Gross per 24 hour  Intake --  Output 550 ml  Net -550 ml   Filed Weights   06/12/21 0500 06/13/21 0500 06/16/21 2351  Weight: 105.8 kg 107.1 kg 105.2 kg    Examination:  General exam: Appears comfortable, lying in the bed with oxygen via nasal cannula. Respiratory system: Clear to auscultation  bilaterally, no wheezing.  Decreased breath sounds bilaterally. Cardiovascular system: S1-S2 heard, regular rate and rhythm, no murmur. Gastrointestinal system: Abdomen is soft, nontender, nondistended, BS+ Central nervous system: Alert and oriented x3, no focal neurological deficits.   Extremities: No edema, no cyanosis, no clubbing. Skin: No rashes, lesions or ulcers Psychiatry: Judgement and insight appear normal. Mood & affect appropriate.     Data Reviewed: I have personally reviewed following labs and imaging studies  CBC: Recent Labs  Lab 06/12/21 0314 06/13/21 0238 06/15/21 0358 06/16/21 0343 06/17/21 0358  WBC 17.5* 16.8* 17.5* 17.5* 19.0*  HGB 9.2* 9.3* 9.1* 8.9* 8.6*  HCT 28.8* 28.8* 28.4* 27.9* 27.3*  MCV 85.7 86.5 86.9 87.2 86.9  PLT 467* 498* 484* 441* 417*   Basic Metabolic Panel: Recent Labs  Lab 06/13/21 0238 06/14/21 0323 06/15/21 0358 06/16/21 0343 06/17/21 0358  NA 140 140 139 140 140  K 4.3 4.2 4.2 4.4 3.9  CL 109 108 105 104 108  CO2 21* 21*  22 21* 19*  GLUCOSE 103* 111* 109* 119* 190*  BUN 40* 37* 39* 44* 42*  CREATININE 1.61* 1.50* 1.54* 1.50* 1.41*  CALCIUM 8.5* 8.7* 8.9 9.0 8.6*  MG 1.7 1.9  --   --   --    GFR: Estimated Creatinine Clearance: 49.9 mL/min (A) (by C-G formula based on SCr of 1.41 mg/dL (H)). Liver Function Tests: Recent Labs  Lab 06/12/21 0314 06/13/21 0238 06/14/21 0323 06/15/21 0358 06/16/21 0343  AST 48* 42* 41 39 55*  ALT 67* 52* 41 33 30  ALKPHOS 114 102 101 92 84  BILITOT 1.1 1.0 1.0 1.1 1.1  PROT 5.9* 6.1* 6.4* 6.4* 6.1*  ALBUMIN 1.6* 1.5* 1.6* 1.8* 1.7*   No results for input(s): LIPASE, AMYLASE in the last 168 hours. No results for input(s): AMMONIA in the last 168 hours. Coagulation Profile: No results for input(s): INR, PROTIME in the last 168 hours. Cardiac Enzymes: No results for input(s): CKTOTAL, CKMB, CKMBINDEX, TROPONINI in the last 168 hours. BNP (last 3 results) No results for input(s): PROBNP in the last 8760 hours. HbA1C: No results for input(s): HGBA1C in the last 72 hours. CBG: Recent Labs  Lab 06/16/21 1505 06/16/21 1811 06/16/21 2202 06/17/21 0803 06/17/21 1152  GLUCAP 124* 107* 130* 174* 204*   Lipid Profile: No results for input(s): CHOL, HDL, LDLCALC, TRIG, CHOLHDL, LDLDIRECT in the last 72 hours. Thyroid Function Tests: No results for input(s): TSH, T4TOTAL, FREET4, T3FREE, THYROIDAB in the last 72 hours. Anemia Panel: No results for input(s): VITAMINB12, FOLATE, FERRITIN, TIBC, IRON, RETICCTPCT in the last 72 hours. Sepsis Labs: Recent Labs  Lab 06/11/21 4081 06/11/21 0658 06/11/21 1003  LATICACIDVEN 1.4 1.1 1.1    Recent Results (from the past 240 hour(s))  Culture, blood (routine x 2)     Status: None   Collection Time: 06/11/21  3:12 AM   Specimen: BLOOD  Result Value Ref Range Status   Specimen Description BLOOD LEFT ANTECUBITAL  Final   Special Requests   Final    BOTTLES DRAWN AEROBIC AND ANAEROBIC Blood Culture adequate volume    Culture   Final    NO GROWTH 5 DAYS Performed at [facility], [address], [place], [place] [address]    Report Status 06/16/2021 FINAL  Final  Culture, blood (routine x 2)     Status: None   Collection Time: 06/11/21  3:28 AM   Specimen: BLOOD LEFT HAND  Result Value Ref Range Status  Specimen Description BLOOD LEFT HAND  Final   Special Requests   Final    BOTTLES DRAWN AEROBIC AND ANAEROBIC Blood Culture adequate volume   Culture   Final    NO GROWTH 5 DAYS Performed at [facility], [address] [place], [place] [address]    Report Status 06/16/2021 FINAL  Final    Radiology Studies: DG Abd 1 View  Result Date: 06/15/2021 CLINICAL DATA:  Constipation EXAM: ABDOMEN - 1 VIEW COMPARISON:  None. FINDINGS: There is a large stool burden in the right colon and to a lesser degree throughout the remainder of the colon and rectum. There is gaseous distention of the small bowel throughout the abdomen without evidence of mechanical obstruction. There is no gross organomegaly or abnormal soft tissue calcification. Multiple pelvic surgical clips are noted. The bones are unremarkable. IMPRESSION: Large stool burden predominately in the right colon without evidence of mechanical obstruction. Electronically Signed   By: Valetta Mole M.D.   On: 06/15/2021 16:12    Scheduled Meds:  apixaban  5 mg Oral BID   aspirin EC  81 mg Oral Daily   feeding supplement  237 mL Oral BID BM   insulin aspart  0-15 Units Subcutaneous TID WC   insulin aspart  0-5 Units Subcutaneous QHS   mouth rinse  15 mL Mouth Rinse BID   mirtazapine  7.5 mg Oral QHS   multivitamin with minerals  1 tablet Oral Daily   pantoprazole  40 mg Oral Daily   simvastatin  10 mg Oral Daily   Continuous Infusions:  piperacillin-tazobactam (ZOSYN)  IV 3.375 g (06/17/21 1014)     LOS: 13 days    Time spent: 25 mins    Pj Zehner, MD [facility]   If 7PM-7AM, please contact night-coverage

## 2021-06-17 NOTE — TOC Progression Note (Signed)
Transition of Care [facility]) - Progression Note    Patient Details  Name: Kevin Hobbs MRN: [id number] Date of Birth: 1941/05/30  Transition [facility]) CM/SW Contact  Pollie Friar, RN Phone Number: 06/17/2021, 4:14 PM  Clinical Narrative:    DME for home has been delivered to the home. Plan is for pt to d/c home in am with [facility] making a visit tomorrow. Wife in agreement with am d/c and MD updated.  TOC following.   Expected Discharge Plan: Home w Hospice Care Barriers to Discharge: Continued Medical Work up  Expected Discharge Plan and Services Expected Discharge Plan: [place]         Expected Discharge Date: 06/09/21               DME Arranged: Hospital bed DME Agency: [employer]       HH Arranged: RN, PT [facility] Agency: [place] ([facility]) Date HH Agency Contacted: 06/09/21 Time [place]: 1414 Representative spoke with at [place]: [place] ([place]) Interventions    Readmission Risk Interventions No flowsheet data found.

## 2021-06-17 NOTE — Progress Notes (Signed)
Physical Therapy Treatment Patient Details Name: Kevin Hobbs MRN: [id number] DOB: [birth year] Today's Date: 06/17/2021   History of Present Illness This 80 years old male presented in the ED 06/04/21 with acute hypoxic respiratory failure, For further staging lung cancer, MRI was done 9/06  which showed scattered embolic strokes involving bilateral anterior and posterior circulation. On 2 L home O2. PMH: recently diagnosed acute pulmonary embolism, DVT, pleural effusion and right middle lobe lung mass, type 2 diabetes.    PT Comments    Pt received in supine, fatigued but agreeable to bed-level exercises and instruction on pressure relief strategies/caregiver training for pt and daughter. Pt with good participation as able for BLE A/AAROM therapeutic exercises, increased assist needed on RLE and daughter instructed on technique to assist pt with proper body mechanics, handout given to reinforce. Pt needing increased assist to roll in bed for repositioning this date (+2 modA) and defer OOB mobility as pt falling asleep after supine exercises. Pt continues to benefit from PT services to progress toward functional mobility goals.    Recommendations for follow up therapy are one component of a multi-disciplinary discharge planning process, led by the attending physician.  Recommendations may be updated based on patient status, additional functional criteria and insurance authorization.  Follow Up Recommendations  Home health PT;Supervision/Assistance - 24 hour (home with hospice support (HHPT if available for home set-up/safety and fall risk prevention))     Equipment Recommendations   (hospital bed, wheelchair with cushion, 3in1, mechanical lift (pending progress))    Recommendations for Other Services       Precautions / Restrictions Precautions Precautions: Fall;Other (comment) Precaution Comments: monitor sats Restrictions Weight Bearing Restrictions: No     Mobility  Bed  Mobility Overal bed mobility: Needs Assistance Bed Mobility: Rolling Rolling: Mod assist;+2 for physical assistance         General bed mobility comments: cues for self-assist to log roll to R side for pressure relief, pt needs heavy cues and assist to bend L knee/hand over hand assist to reach for opposite rail; daughter courtney present for caregiver instruction           Modified Rankin (Stroke Patients Only) Modified Rankin (Stroke Patients Only) Pre-Morbid Rankin Score: Moderate disability Modified Rankin: Moderately severe disability        Cognition Arousal/Alertness: Awake/alert Behavior During Therapy: WFL for tasks assessed/performed Overall Cognitive Status: Difficult to assess   General Comments: pt requiring increased time to process information, pt following commands consistently;pt soft spoken, initially alert but becoming more drowsy toward end of session; quick to fatigue      Exercises Other Exercises Other Exercises: supine BLE A/AAROM: ankle pumps, heel slides, hip ab/adduction, SAQ x10 reps ea Other Exercises: supine BUE AROM: elbow flex/ext, shoulder flexion in pain-free range, wrist flex/ext x3-5 reps ea (pt fatigued)    General Comments General comments (skin integrity, edema, etc.): instruction on pressure relief strategies (handout given), caregiver training for safety with body mechanics while lifting/turning patient, supine UE/LE exercises; VSS on O2 [place] throughout, HR 100-110 bpm with exertion when repositioning, BP WNL      Pertinent Vitals/Pain Pain Assessment: Faces Faces Pain Scale: Hurts little more Pain Location: left middle finger Pain Descriptors / Indicators: Sore Pain Intervention(s): Monitored during session;Repositioned     PT Goals (current goals can now be found in the care plan section) Acute Rehab PT Goals Patient Stated Goal: to go home and be with family PT Goal Formulation: With patient/family Time For Goal  Achievement:  06/30/2021 Potential to Achieve Goals: Fair Progress towards PT goals: Progressing toward goals (slow progress, fatigue limiting (plan home with hospice now per chart review))    Frequency    Min 3X/week      PT Plan Current plan remains appropriate       AM-PAC PT "6 Clicks" Mobility   Outcome Measure  Help needed turning from your back to your side while in a flat bed without using bedrails?: A Lot Help needed moving from lying on your back to sitting on the side of a flat bed without using bedrails?: A Lot Help needed moving to and from a bed to a chair (including a wheelchair)?: Total Help needed standing up from a chair using your arms (e.g., wheelchair or bedside chair)?: Total Help needed to walk in hospital room?: Total Help needed climbing 3-5 steps with a railing? : Total 6 Click Score: 8    End of Session Equipment Utilized During Treatment: Oxygen Activity Tolerance: Patient limited by fatigue Patient left: in bed;with call bell/phone within reach;with bed alarm set;with family/visitor present Nurse Communication: Mobility status PT Visit Diagnosis: Difficulty in walking, not elsewhere classified (R26.2)     Time: 7494-4967 PT Time Calculation (min) (ACUTE ONLY): 33 min  Charges:  $Therapeutic Exercise: 8-22 mins $Therapeutic Activity: 8-22 mins                     Chanda Laperle P., PTA Acute Rehabilitation Services Pager: [phone] Office: [place] 06/17/2021, 6:18 PM

## 2021-06-17 NOTE — Progress Notes (Signed)
   06/16/21 2230  Assess: MEWS Score  Resp (!) 25  Assess: MEWS Score  MEWS Temp 0  MEWS Systolic 0  MEWS Pulse 2  MEWS RR 1  MEWS LOC 0  MEWS Score 3  MEWS Score Color Yellow  Assess: if the MEWS score is Yellow or Red  Were vital signs taken at a resting state? Yes  Focused Assessment No change from prior assessment  Does the patient meet 2 or more of the SIRS criteria? Yes  Early Detection of Sepsis Score *See Row Information* Medium  MEWS guidelines implemented *See Row Information* Yes  Treat  Pain Scale 0-10  Pain Score 0  Faces Pain Scale 0  Take Vital Signs  Increase Vital Sign Frequency  Yellow: Q 2hr X 2 then Q 4hr X 2, if remains yellow, continue Q 4hrs  Escalate  MEWS: Escalate Yellow: discuss with charge nurse/RN and consider discussing with provider and RRT  Notify: Charge Nurse/RN  Name of Charge Nurse/RN Notified [place] RN  Date Charge Nurse/RN Notified 06/16/21  Time Charge Nurse/RN Notified 2240  Document  Patient Outcome Stabilized after interventions  Assess: SIRS CRITERIA  SIRS Temperature  0  SIRS Pulse 1  SIRS Respirations  1  SIRS WBC 0  SIRS Score Sum  2

## 2021-06-17 NOTE — Progress Notes (Signed)
Daily Progress Note   Patient Name: Kevin Hobbs       Date: 06/17/2021 DOB: 1940-11-01  Age: 80 y.o. MRN#: [id number] Attending Physician: Shawna Clamp, MD Primary Care Physician: Robyne Peers, MD Admit Date: 06/04/2021  Reason for Consultation/Follow-up: goals of care, symptom management  Subjective: Chart reviewed. Note that patient had an acute decline in status on 06/15/21. Patient and family ultimately decided not to proceed with radiation therapy, and elected for home with hospice.   Went to see patient at bedside. Daughter Loma Sousa is present. She reports he did "not have a good night last night", did not sleep at all. Was unable to tolerate BiPAP. He was given trazodone, which did not help. Loma Sousa reports that overall he seems "improved" today. We discussed that he would have good days and bad days in the setting of end-stage illness. Reviewed that the goal of hospice care would be to promote quality of life and help him feel as good as possible while also allowing a natural course to occur.   Recommended starting a low-dose benzodiazepine to help with sleep. Patient and daughter are agreeable to this. We also discussed indication for the use of opioids for the management of dyspnea in the setting of home hospice care. Patient and daughter are both open to this, but would prefer the lowest possible dose that is effective.   Length of Stay: 13  Current Medications: Scheduled Meds:   apixaban  5 mg Oral BID   aspirin EC  81 mg Oral Daily   feeding supplement  237 mL Oral BID BM   insulin aspart  0-15 Units Subcutaneous TID WC   insulin aspart  0-5 Units Subcutaneous QHS   mouth rinse  15 mL Mouth Rinse BID   mirtazapine  7.5 mg Oral QHS   multivitamin with minerals  1  tablet Oral Daily   pantoprazole  40 mg Oral Daily   simvastatin  10 mg Oral Daily    Continuous Infusions:  piperacillin-tazobactam (ZOSYN)  IV 3.375 g (06/17/21 1716)    PRN Meds: acetaminophen **OR** acetaminophen, albuterol, ALPRAZolam, chlorpheniramine-HYDROcodone, guaiFENesin-dextromethorphan, traZODone  Physical Exam Constitutional:      General: He is not in acute distress.    Appearance: He is ill-appearing.  Cardiovascular:     Rate and Rhythm: Tachycardia present.  Pulmonary:     Effort: Tachypnea present.  Neurological:     Mental Status: He is alert and oriented to person, place, and time.            Vital Signs: BP 120/61   Pulse 90   Temp 99.1 F (37.3 C) (Axillary)   Resp (!) 30   Ht 5\' 7"  (1.702 m)   Wt 105.2 kg   SpO2 99%   BMI 36.32 kg/m  SpO2: SpO2: 99 % O2 Device: O2 Device: Nasal Cannula O2 Flow Rate: O2 Flow Rate (L/min): 4 L/min  Intake/output summary:  Intake/Output Summary (Last 24 hours) at 06/17/2021 2000 Last data filed at 06/17/2021 1500 Gross per 24 hour  Intake --  Output 350 ml  Net -350 ml   LBM: Last BM Date: 06/17/21 Baseline Weight: Weight: 107 kg Most recent weight: Weight: 105.2 kg       Palliative Assessment/Data: PPS 30-40%      Palliative Care Assessment & Plan   HPI/Patient Profile: 80 y.o. male  with past medical history of recently diagnosed acute pulmonary embolism, right pleural effusion, right middle lobe lung mass, recently diagnosed acute left lower extremity DVT, and type 2 diabetes. He was admitted to [facility] hospital on 06/04/2021 with acute on chronic hypoxic respiratory failure after presenting from home to [facility] emergency department complaining of shortness of breath.    Patient was recently hospitalized 05/25/21 - 05/27/21 at [facility] for acute hypoxic respiratory failure. Work-up included CTA chest which showed evidence of acute pulmonary embolism, right pleural effusion, and right middle lobe lung mass (which  was reportedly new). 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   PET scan was done 06/03/21 and showed evidence of metastatic disease.    Assessment: - acute on chronic hypoxic respiratory failure - post obstructive pneumonia - pulmonary embolism - right middle lobe carcinoma, malignant pleural effusion - acute CVA, incidental - acute kidney injury  Recommendations/Plan: Start lorazepam (Ativan) 0.5 mg at bedtime for sleep D/C trazodone Plan for discharge home with hospice tomorrow  Goals of Care and Additional Recommendations: Limitations on Scope of Treatment: No radiation, avoid rehospitalization  Code Status:  DNR  Prognosis:  < 6 months  Discharge Planning: Home with Hospice    Thank you for allowing the Palliative Medicine Team to assist in the care of this patient.   Total Time 25 minutes Prolonged Time Billed  no       Greater than 50%  of this time was spent counseling and coordinating care related to the above assessment and plan.  Lavena Bullion, NP  Please contact Palliative Medicine Team phone at [phone] for questions and concerns.

## 2021-06-17 NOTE — Progress Notes (Signed)
RT note. Patient placed on bipap at this time 12/6 R10 30% Patient sat 96%. Labored breathing noted and breathing in 30's. RT will continue to monitor.

## 2021-06-18 LAB — GLUCOSE, CAPILLARY
Glucose-Capillary: 212 mg/dL — ABNORMAL HIGH (ref 70–99)
Glucose-Capillary: 132 mg/dL — ABNORMAL HIGH (ref 70–99)

## 2021-06-18 MED ORDER — MIRTAZAPINE 7.5 MG PO TABS: 7.5000 mg | ORAL_TABLET | Freq: Every day | ORAL | 1 refills | Status: AC

## 2021-06-18 MED ORDER — OXYCODONE HCL 5 MG PO TABS: 5.0000 mg | ORAL_TABLET | ORAL | 0 refills | Status: AC | PRN

## 2021-06-18 MED ORDER — LORAZEPAM 0.5 MG PO TABS: 0.5000 mg | ORAL_TABLET | Freq: Three times a day (TID) | ORAL | 0 refills | Status: AC | PRN

## 2021-06-18 NOTE — Discharge Summary (Addendum)
Physician Discharge Summary  GAROLD SHEELER XLK:[id number] DOB: April 05, 1941 DOA: 06/04/2021  PCP: Robyne Peers, MD  Admit date: 06/04/2021  Discharge date: 06/18/2021.  Admitted From: Home  Disposition: Residential hospice.  Recommendations for Outpatient Follow-up:  Follow up with PCP in 1-2 weeks. Please obtain BMP/CBC in one week. Patient is being discharged home with home hospice.  Home Health: Home health services been arranged. Equipment/Devices: Home oxygen, CPAP  Discharge Condition: Stable CODE STATUS:DNR Diet recommendation: Heart Healthy   Brief Summary / Hospital Course: This 80 year old male with PMH significant for type 2 diabetes mellitus, who presented with recurrent dyspnea.  He had a recent hospitalization from 8/23-8/25/2022 when he was diagnosed with pulmonary embolism, right pleural effusion and right middle lobe lung mass. He underwent thoracentesis and 1.3 L of fluid was removed.  At home he had progressive recurrent shortness of breath associated with cough and orthopnea.  Because of persistent and worsening symptoms he came back to the hospital.  He was admitted for acute on chronic hypoxic respiratory failure and  was placed on supplemental oxygen per nasal cannula. He underwent repeat  thoracentesis 22 cc of amber fluid was obtained. He has received antibiotic therapy for possible post-obstructive pneumonia with improvement of his symptoms. Brain MRI was performed as part of lung cancer staging , finding new CVA bilateral. Neurology was consulted with recommendations for transfer to [facility] for further neurologic workup.  Stroke work-up has been  complete.  Oncology recommended palliative radiation therapy. Patient has decided to be DNR/DNI and would not want to receive palliative radiation therapy.  Patient is requesting home with home hospice. Patient is being discharged to home with home hospice.  Home health services been arranged.   He was managed for below  problems.  Discharge Diagnoses:  Principal Problem:   Acute on chronic respiratory failure with hypoxemia (HCC) Active Problems:   Pulmonary embolism (HCC)   Pleural effusion   SOB (shortness of breath)   Diabetes mellitus without complication (HCC)   Essential hypertension   Hyperkalemia   GERD (gastroesophageal reflux disease)   Cerebrovascular accident (CVA) ([place])  Acute on chronic hypoxic respiratory failure sec. to COPD and postobstructive pneumonia. He initially received treatment with 5 days of azithromycin and prednisone along with albuterol as needed.  Speech therapy recommended dysphagia 3 diet. Patient has worsening cough with fever of 102.1 F. Chest x-ray on 06/11/2021 notable for stable right basilar consolidation. Given concerns of developing pneumonia, patient was started on IV ceftriaxone, Flagyl.   Despite abx, patient continued to have progressively worsening respiratory status. F/u CT chest on 9/13 reviewed, notable for significant post-obstructive pneumonia over R middle and lower lung fields Initiated on IV Zithromax, Pulmonary consulted due to increased work of breathing. Recommend broadening to zosyn, Rad-Onc consultation. Continue BiPAP at night and as needed. Discussed with Radiation Oncology.  Patient does not want to receive palliative radiation therapy.   He is requesting to go home on hospice at this time. All the equipments has been delivered to the patient's home.      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  No evidence of intracranial metastatic disease. Few scattered chronic microhemorrhages involving the soft tentorial brain. Echocardiogram from 08/22 had  no intracavitary thrombus. Dr. Cheral Marker from  neurology has recommended patient to be transfer to [facility] for stroke team evaluation. No evidence of intracardiac shunt.  Continue on anticoagulation with apixaban. Cont with statin. Seems to be stable neurologically at this time.   Acute pulmonary embolism.  Continue with Eliquis.   Acute kidney injury: >  Improved. Recently given IV Lasix with subsequent creatinine in excess of 1.6.   Losartan currently remains on hold.  Patient received IV fluid bolus. Functions improving and back to baseline.  Type 2 diabetes: Continue metformin.   Hypertension : Continue losartan, continue amlodipine.    GERD;  continue PPI   Depression: Continue Remeron.    Discharge Instructions  Discharge Instructions     Ambulatory referral to Neurology   Complete by: As directed    An appointment is requested in approximately: 4 weeks. Post hospitalization follow up.   Call MD for:  difficulty breathing, headache or visual disturbances   Complete by: As directed    Call MD for:  persistant dizziness or light-headedness   Complete by: As directed    Call MD for:  persistant nausea and vomiting   Complete by: As directed    Diet - low sodium heart healthy   Complete by: As directed    Diet - low sodium heart healthy   Complete by: As directed    Diet Carb Modified   Complete by: As directed    Discharge instructions   Complete by: As directed    Please follow up with primary care in 7 to 10 days.   Discharge instructions   Complete by: As directed    Patient is being discharged home with home hospice.   Increase activity slowly   Complete by: As directed    Increase activity slowly   Complete by: As directed       Allergies as of 06/18/2021   No Known Allergies      Medication List     STOP taking these medications    amoxicillin 500 MG tablet Commonly known as: AMOXIL   clarithromycin 500 MG tablet Commonly known as: BIAXIN        TAKE these medications    acetaminophen 325 MG tablet Commonly known as: TYLENOL Take 2 tablets (650 mg total) by mouth every 6 (six) hours as needed for mild pain (or Fever >/= 101).   amLODipine 10 MG tablet Commonly known as: NORVASC Take 10 mg by mouth daily.   apixaban 5 MG Tabs tablet Commonly known as: ELIQUIS Take 2 tablets (10 mg total) by mouth 2 (two) times daily for 7 days, THEN 1 tablet (5 mg total) 2 (two) times daily for 28 days. Start taking on: May 27, 2021   budesonide-formoterol 80-4.5 MCG/ACT inhaler Commonly known as: SYMBICORT Inhale 2 puffs into the lungs in the morning and at bedtime.   escitalopram 5 MG tablet Commonly known as: LEXAPRO Take 5 mg by mouth daily.   feeding supplement Liqd Take 237 mLs by mouth 2 (two) times daily between meals.   LORazepam 0.5 MG tablet Commonly known as: ATIVAN Take 1 tablet (0.5 mg total) by mouth every 8 (eight) hours as needed for anxiety or sleep.   losartan 100 MG tablet Commonly known as: COZAAR Take 100 mg by mouth daily.   metFORMIN 500 MG 24 hr tablet Commonly known as: GLUCOPHAGE-XR Take 1,000 mg by mouth daily.   mirtazapine 7.5 MG tablet Commonly known as: REMERON Take 1 tablet (7.5 mg total) by mouth at bedtime.  ondansetron 4 MG tablet Commonly known as: ZOFRAN Take 1 tablet (4 mg total) by mouth every 6 (six) hours as needed for nausea.   oxyCODONE 5 MG immediate release tablet Commonly known as: Roxicodone Take 1 tablet (5 mg total) by mouth every 4 (four) hours as needed for severe pain or moderate pain (or shortness of breath).   pantoprazole 40 MG tablet Commonly known as: PROTONIX Take 40 mg by mouth daily.   simvastatin 10 MG tablet Commonly known as: ZOCOR Take 10 mg by mouth daily.   sodium chloride 0.65 % nasal spray Commonly known as: OCEAN Place 1 spray into the nose at bedtime.   Spiriva HandiHaler 18 MCG inhalation capsule Generic drug: tiotropium Place 1  capsule (18 mcg total) into inhaler and inhale daily.               Durable Medical Equipment  (From admission, onward)           Start     Ordered   06/09/21 1316  For home use only DME Hospital bed  Once       Question Answer Comment  Length of Need 6 Months   The above medical condition requires: Patient requires the ability to reposition frequently   Head must be elevated greater than: 30 degrees   Bed type Semi-electric      06/09/21 1315            Follow-up Information     [facility] Follow up.   Why: Hospital bed Contact information: [place] [id number] [phone]         [facility] Follow up.   Why: HH nursing/physical therapy start of care date next week Contact information: [address] [place] [address] [phone]         [facility]. Schedule an appointment as soon as possible for a visit in 1 month(s).   Specialty: Neurology Why: stroke clinic Contact information: [place] [phone]               No Known Allergies  Consultations: Pulmonology Palliative care   Procedures/Studies: CT ANGIO HEAD NECK W WO CM  Result Date: 06/11/2021 CLINICAL DATA:  Stroke, follow up EXAM: CT ANGIOGRAPHY HEAD AND NECK TECHNIQUE: Multidetector CT imaging of the head and neck was performed using the standard protocol during bolus administration of intravenous contrast. Multiplanar CT image reconstructions and MIPs were obtained to evaluate the vascular anatomy. Carotid stenosis measurements (when applicable) are obtained utilizing NASCET criteria, using the distal internal carotid diameter as the denominator. CONTRAST:  40mL OMNIPAQUE IOHEXOL 350 MG/ML SOLN COMPARISON:  None. FINDINGS: CT HEAD FINDINGS Brain: Multiple small acute infarcts in bilateral cerebral hemispheres are better characterized on recent  MRI. No evidence of interval acute large vascular territory infarct or acute hemorrhage. No mass lesion, midline shift, hydrocephalus, or extra-axial fluid collection. Patchy white matter hypoattenuation, nonspecific but likely in part related to chronic microvascular ischemic disease. Mild atrophy. Vascular: See below. Skull: No acute fracture. Sinuses: Clear sinuses. Orbits: No acute finding. Review of the MIP images confirms the above findings CTA NECK FINDINGS Aortic arch: Curve vessel origins are patent. Right carotid system: Patent. Mild atherosclerosis of the carotid bifurcation without significant open greater than 50%) stenosis at the bifurcation. Tortuous ICA with moderate stenosis proximally due to abrupt turn/kink. Left carotid system: Atherosclerosis and irregularity at the carotid bifurcation without  greater than 50% stenosis relative to the distal vessel. Vertebral arteries: Mildly left dominant. No evidence of dissection, stenosis (50% or greater) or occlusion. Mild atherosclerosis. Skeleton: Reversal of the normal cervical lordosis. Moderate multilevel degenerative disc disease in the upper cervical spine with disc height loss, endplate sclerosis and posterior disc osteophyte complexes. Other neck: There is dilation of the right laryngeal ventricle with medialization of the posterior aspect of the right vocal cord and medialization of the right arytenoid cartilage, suggestive of right vocal cord paralysis. Upper chest: Right pleural effusion, atelectasis, and right paratracheal and adenopathy, partially imaged and further characterized on recent CT chest from 05/25/2021. Review of the MIP images confirms the above findings CTA HEAD FINDINGS Anterior circulation: Right petrous ICA is patent. Bilateral cavernous and paraclinoid ICA atherosclerosis with severe stenosis versus occlusion of the cavernous right ICA with opacified paraclinoid ICA. Patent right MCA with out high-grade proximal stenosis.  Left intracranial ICA is patent with multifocal moderate stenosis. Left MCA is patent without proximal high-grade stenosis. Right A1 ACA is small, probably congenital given prominent left A1 ACA. Otherwise common bilateral ACAs are patent without proximal high-grade stenosis. No aneurysm identified. Posterior circulation: Bilateral intradural vertebral arteries, basilar artery, and posterior cerebral arteries are patent without proximal high-grade stenosis. Mild proximal right P2 PCA stenosis. No aneurysm identified. Venous sinuses: As permitted by contrast timing, patent. Small left transverse sinus and distal transverse sinus arachnoid granulation Review of the MIP images confirms the above findings IMPRESSION: CT head: Multiple small acute infarcts in bilateral cerebral hemispheres are better characterized on recent MRI. No evidence of interval acute large vascular territory infarct or acute hemorrhage. CTA Head: 1. Severe stenosis versus occlusion of the cavernous right ICA with opacified moderately narrowed paraclinoid right ICA. Patent right MCA. 2. Multifocal moderate left intracranial ICA stenosis. 3. Mild right P2 PCA stenosis. CTA Neck: 1. Tortuous right ICA with moderate stenosis proximally due to abrupt turn/kink. 2. Otherwise, no significant (greater than 50%) stenosis in the neck. 3. 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 Recommend correlation with the presence or absence of hoarseness. 4. Right pleural effusion, atelectasis, and right paratracheal and adenopathy, partially imaged and further characterized on recent CT chest from 05/25/2021. Electronically Signed   By: Margaretha Sheffield M.D.   On: 06/11/2021 11:32   DG Chest 1 View  Result Date: 06/05/2021 CLINICAL DATA:  Status post thoracentesis. EXAM: CHEST  1 VIEW COMPARISON:  Chest radiograph dated 06/04/2021. FINDINGS: The heart is obscured. A moderate right pleural effusion with associated  atelectasis/consolidation appears unchanged. There is no right pneumothorax. The left lung is clear without pleural effusion or pneumothorax. IMPRESSION: No significant change in a moderate right pleural effusion with associated atelectasis/consolidation. No pneumothorax. Electronically Signed   By: Zerita Boers M.D.   On: 06/05/2021 12:52   DG Chest 1 View  Result Date: 05/26/2021 CLINICAL DATA:  Status post right thoracentesis. EXAM: CHEST  1 VIEW COMPARISON:  One-view chest x-ray 05/25/2021 FINDINGS: Heart is enlarged. Right pleural effusion is significantly decreased. No pneumothorax is present. Left lung is clear. Pulmonary vascular congestion noted. IMPRESSION: 1. Right pleural effusion without evidence for complication. 2. Pulmonary vascular congestion. Electronically Signed   By: San Morelle M.D.   On: 05/26/2021 15:36   DG Chest 2 View  Result Date: 06/04/2021 CLINICAL DATA:  Shortness of breath.  History of lung cancer. EXAM: CHEST - 2 VIEW COMPARISON:  Radiograph 05/27/2021.  PET CT yesterday.  FINDINGS: Again seen elevation of right hemidiaphragm. Right pleural effusion and right pulmonary mass seen on PET CT yesterday. Stable heart size and mediastinal contours. Peribronchial thickening. No pneumothorax. No significant left pleural effusion. No acute osseous abnormalities are seen. Known lytic lesions on prior PET are not seen by radiograph. IMPRESSION: 1. Unchanged elevation of right hemidiaphragm. Patient with known right pulmonary mass and pleural effusion, evaluated on PET yesterday. 2. Bronchial thickening. Electronically Signed   By: Keith Rake M.D.   On: 06/04/2021 16:31   DG Abd 1 View  Result Date: 06/15/2021 CLINICAL DATA:  Constipation EXAM: ABDOMEN - 1 VIEW COMPARISON:  None. FINDINGS: There is a large stool burden in the right colon and to a lesser degree throughout the remainder of the colon and rectum. There is gaseous distention of the small bowel throughout the  abdomen without evidence of mechanical obstruction. There is no gross organomegaly or abnormal soft tissue calcification. Multiple pelvic surgical clips are noted. The bones are unremarkable. IMPRESSION: Large stool burden predominately in the right colon without evidence of mechanical obstruction. Electronically Signed   By: Valetta Mole M.D.   On: 06/15/2021 16:12   CT CHEST WO CONTRAST  Result Date: 06/14/2021 CLINICAL DATA:  Normal x-ray. EXAM: CT CHEST WITHOUT CONTRAST TECHNIQUE: Multidetector CT imaging of the chest was performed following the standard protocol without IV contrast. COMPARISON:  None. FINDINGS: Cardiovascular: There is moderate severity calcification of the aortic arch. Normal heart size with marked severity coronary artery calcification. A small pericardial effusion is noted. Mediastinum/Nodes: There is mild pretracheal lymphadenopathy. Narrowing of the proximal portions of the bilateral mainstem bronchi is seen. The thyroid gland and esophagus demonstrate no significant findings. Lungs/Pleura: Marked severity airspace disease is seen throughout the right middle lobe and right lower lobe. A small right pleural effusion is noted. There is no evidence of a pneumothorax. Upper Abdomen: No acute abnormality. Musculoskeletal: No chest wall mass or suspicious bone lesions identified. IMPRESSION: 1. Marked severity airspace disease throughout the right middle lobe and right lower lobe, consistent with pneumonia. Follow-up to resolution is recommended to exclude the presence of an underlying neoplastic process. 2. Small right pleural effusion. 3. Small pericardial effusion. 4. Narrowing of the proximal portions of the bilateral mainstem bronchi. Aortic Atherosclerosis (ICD10-I70.0). Electronically Signed   By: Virgina Norfolk M.D.   On: 06/14/2021 19:13   CT Angio Chest PE W and/or Wo Contrast  Result Date: 05/25/2021 CLINICAL DATA:  Shortness of breath EXAM: CT ANGIOGRAPHY CHEST WITH  CONTRAST TECHNIQUE: Multidetector CT imaging of the chest was performed using the standard protocol during bolus administration of intravenous contrast. Multiplanar CT image reconstructions and MIPs were obtained to evaluate the vascular anatomy. CONTRAST:  41mL OMNIPAQUE IOHEXOL 350 MG/ML SOLN COMPARISON:  None. FINDINGS: Cardiovascular: Examination for pulmonary embolism is limited by marginal contrast bolus, main pulmonary artery = 141 HU. Within this limitation, positive examination for pulmonary embolism, with segmental to subsegmental embolus present in the right upper lobe (series 4, image 37). Normal heart size. Three-vessel coronary artery calcifications. No pericardial effusion. Aortic atherosclerosis. Enlargement of the main pulmonary artery measuring up to 3.6 cm in caliber. RV LV ratio is preserved, approximately 0.6. Mediastinum/Nodes: Markedly enlarged subcarinal nodes measuring at least 6.1 x 2.6 cm (series 4, image 52) thyroid gland, trachea, and esophagus demonstrate no significant findings. Lungs/Pleura: Moderate right pleural effusion associated atelectasis or consolidation. There is a large mass centered in the right middle lobe, which occludes the middle lobe segmental bronchi,  although difficult to distinguish from airspace consolidation and adjacent atelectasis, this measures approximately 7.9 x 7.7 cm (series 4, image 55). Upper Abdomen: No acute abnormality. Gallstones and or sludge in the dependent gallbladder. Musculoskeletal: No chest wall abnormality. No acute or significant osseous findings. Review of the MIP images confirms the above findings. IMPRESSION: 1. Positive examination for pulmonary embolism, with segmental to subsegmental embolus present in the right upper lobe. 2. Enlargement of the main pulmonary artery, concerning for pulmonary hypertension. No elevation of the RV LV ratio. 3. There is a large mass centered in the right middle lobe, which occludes the middle lobe  segmental bronchi, although difficult to distinguish from airspace consolidation and adjacent atelectasis, this measures approximately 7.9 x 7.7 cm. 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 4. Markedly enlarged subcarinal lymph nodes, concerning for nodal metastatic disease. 5. Moderate right pleural effusion and associated atelectasis or consolidation, presumably malignant, however without direct evidence of pleural metastatic disease. 6. Coronary artery disease. These results were called by telephone at the time of interpretation on 05/25/2021 at 11:29 am to Dr. Lennice Sites , who verbally acknowledged these results. Aortic Atherosclerosis (ICD10-I70.0). Electronically Signed   By: Eddie Candle M.D.   On: 05/25/2021 11:30   MR BRAIN W WO CONTRAST  Result Date: 06/09/2021 CLINICAL DATA:  Initial evaluation for non-small cell lung cancer, staging. EXAM: MRI HEAD WITHOUT AND WITH CONTRAST TECHNIQUE: Multiplanar, multiecho pulse sequences of the brain and surrounding structures were obtained without and with intravenous contrast. CONTRAST:  44mL GADAVIST GADOBUTROL 1 MMOL/ML IV SOLN COMPARISON:  None available. FINDINGS: Brain: Examination degraded by motion artifact. Diffuse prominence of the CSF containing spaces compatible with generalized age-related cerebral atrophy. Patchy and confluent T2/FLAIR hyperintensity involving the periventricular and deep white matter both cerebral hemispheres as well as the pons, most consistent with chronic small vessel ischemic disease, fairly advanced in nature. Superimposed remote lacunar infarct noted involving the left lentiform nucleus/external capsule. 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 Largest of these foci seen at the right frontal centrum semi  ovale and measures up to 1 cm (series 5, image 79). Minimal scattered cortical involvement noted at the parieto-occipital regions. No associated mass effect or hemorrhage. Gray-white matter differentiation otherwise maintained. No encephalomalacia to suggest chronic cortical infarction elsewhere within the brain. No acute intracranial hemorrhage. Few scattered chronic micro hemorrhages noted involving both cerebral hemispheres, likely related to poorly controlled hypertension. No mass lesion or abnormal enhancement. No evidence for intracranial metastatic disease. No mass effect or midline shift. No hydrocephalus or extra-axial fluid collection. Pituitary gland suprasellar region within normal limits. Midline structures intact. Vascular: Major intracranial vascular flow voids are maintained. Skull and upper cervical spine: Craniocervical junction within normal limits. Bone marrow signal intensity grossly within normal limits. No visible focal marrow replacing lesion. No scalp soft tissue abnormality. Sinuses/Orbits: Patient status post bilateral ocular lens replacement. Globes and orbital soft tissues demonstrate no acute finding. Mild scattered mucosal thickening noted about the ethmoidal air cells. Paranasal sinuses are otherwise clear. No significant mastoid effusion. Inner ear structures grossly normal. Other: None. IMPRESSION: 1. 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 No associated hemorrhage or mass effect. 2. No evidence for intracranial metastatic disease. 3. Underlying age-related cerebral  atrophy with advanced chronic microvascular ischemic disease. 4. Few scattered chronic micro hemorrhages involving the supratentorial brain, likely related to chronic poorly controlled hypertension. Electronically Signed   By: Jeannine Boga M.D.   On: 06/09/2021 01:45   NM PET Image Initial (PI) Skull Base To Thigh  Result Date: 06/04/2021 CLINICAL DATA:  Initial treatment strategy for lung nodule. EXAM: NUCLEAR MEDICINE PET SKULL BASE TO THIGH TECHNIQUE: 12.9 mCi F-18 FDG was injected intravenously. Full-ring PET imaging was performed from the skull base to thigh after the radiotracer. CT data was obtained and used for attenuation correction and anatomic localization. Fasting blood glucose: 115 mg/dl COMPARISON:  Chest CTA dated May 25, 2021 FINDINGS: Mediastinal blood pool activity: SUV max 3.1 Liver activity: SUV max 3.8. NECK: Hypermetabolic right supraclavicular lymph node measuring 7 mm in short axis on image number 39 with an SUV max of 6.8. Incidental CT findings: none CHEST: Hypermetabolic mass centered in the right upper lobe, difficult to measure, but approximately 9.4 x 9.4 cm with an SUV max of 23. Hypermetabolic right paratracheal, subcarinal and bilateral hilar lymph nodes. Reference subcarinal lymph node measures approximately 2.0 cm in short axis with an SUV max of 11.2. Reference right hilar lymph node measures 1.5 cm in short axis on image 69 with an SUV max of 15.2. Reference left hilar lymph node measures approximately 1.4 cm in short axis with an SUV max of 10.4. Small loculated right pleural effusion with pleural FDG uptake, SUV max of 6.3. Incidental CT findings: No pericardial effusion. Calcifications of the LAD and circumflex. Atherosclerotic disease of the thoracic aorta. ABDOMEN/PELVIS: No abnormal hypermetabolic activity within the liver, pancreas, adrenal glands, or spleen. No hypermetabolic lymph nodes in the abdomen or pelvis. Incidental CT findings: Cholelithiasis and sigmoid diverticula. SKELETON: Scattered foci of osseous hypermetabolic activity which correlate with subtle lytic lesions in the right iliac bone, T12 vertebral body, lateral sixth rib, and left scapula. Reference right iliac lesion  demonstrates an SUV max of 8.2. Incidental CT findings: none IMPRESSION: Large hypermetabolic mass centered in the right middle lobe which occludes the right middle lobe bronchi. Small loculated pleural effusion with pleural FDG uptake, compatible with malignant effusion. Hypermetabolic right supraclavicular, right paratracheal, subcarinal, and bilateral hilar lymph nodes. Scattered foci of osseous hypermetabolic activity which correlate with subtle lytic lesions, compatible with osseous metastatic disease. Aortic Atherosclerosis (ICD10-I70.0). Electronically Signed   By: Yetta Glassman M.D.   On: 06/04/2021 16:20   DG CHEST PORT 1 VIEW  Result Date: 06/14/2021 CLINICAL DATA:  Follow-up pleural effusion EXAM: PORTABLE CHEST 1 VIEW COMPARISON:  06/12/2021 FINDINGS: Cardiac shadow is stable. Elevation of the right hemidiaphragm is again noted. Right-sided pleural effusion is seen with right basilar infiltrate stable from the prior exam. The vascular congestion has improved in the interval from the prior study. No bony abnormality is seen. IMPRESSION: Persistent right basilar infiltrate and effusion Electronically Signed   By: Inez Catalina M.D.   On: 06/14/2021 14:05   DG CHEST PORT 1 VIEW  Result Date: 06/12/2021 CLINICAL DATA:  Shortness of breath. EXAM: PORTABLE CHEST 1 VIEW COMPARISON:  June 11, 2021 FINDINGS: Mediastinal contour and cardiac silhouette are stable. Heart size is enlarged. Dense consolidation of the right mid and lung base are identified without change. Increased pulmonary interstitium is identified bilaterally. The bony structures are stable. IMPRESSION: Congestive heart failure. Persistent consolidation of the right lung base with right pleural effusion unchanged. Electronically Signed   By: Mallie Darting.D.  On: 06/12/2021 13:49   DG CHEST PORT 1 VIEW  Result Date: 06/11/2021 CLINICAL DATA:  Dyspnea, acute on chronic respiratory failure EXAM: PORTABLE CHEST 1 VIEW COMPARISON:   06/07/2021 FINDINGS: There is dense consolidation of the a right lung base, similar to prior examination and corresponding to the known mass in this region better seen on CT examination of 05/25/2021. Left lung is clear. No pneumothorax. Small right pleural effusion is unchanged. Cardiac size is within normal limits. Pulmonary vascularity is normal. IMPRESSION: Stable right basilar consolidation corresponding to known right basilar mass and associated small right pleural effusion. Electronically Signed   By: Fidela Salisbury M.D.   On: 06/11/2021 01:47   DG CHEST PORT 1 VIEW  Result Date: 06/07/2021 CLINICAL DATA:  Right lower lung mass with component of pleural effusion. EXAM: PORTABLE CHEST 1 VIEW COMPARISON:  Chest x-rays on 06/04/2021 and 06/05/2021. PET scan on 06/03/2021. FINDINGS: Stable heart size and aortic tortuosity. Extensive mass-like density and consolidation of the right lower lung noted which has been previously demonstrated to be a large mass at the right lung base. Component of associated right pleural fluid may be present. However, recent ultrasound did not demonstrate a large pleural effusion. No pneumothorax or pulmonary edema. IMPRESSION: Mass-like opacity and consolidation of the right lower lung. Potential associated right pleural fluid. Electronically Signed   By: Aletta Edouard M.D.   On: 06/07/2021 08:43   DG CHEST PORT 1 VIEW  Result Date: 05/27/2021 CLINICAL DATA:  Short of breath.  Right thoracentesis yesterday EXAM: PORTABLE CHEST 1 VIEW COMPARISON:  05/26/2021 FINDINGS: Persistent density in the right lung base may represent neoplasm based on CT. Pneumonia possible. Small right effusion unchanged. No pneumothorax Left lung remains clear. IMPRESSION: Persistent airspace density in the right lung base may represent tumor or pneumonia. No pneumothorax. Electronically Signed   By: Franchot Gallo M.D.   On: 05/27/2021 08:19   DG Chest Portable 1 View  Result Date:  05/25/2021 CLINICAL DATA:  Breath for 1 week, hypertension, RIGHT pleural effusion by abdominal ultrasound EXAM: PORTABLE CHEST 1 VIEW COMPARISON:  Portable exam 0920 hours without priors for comparison FINDINGS: Enlargement of cardiac silhouette with pulmonary vascular congestion. Atherosclerotic calcification aorta. RIGHT pleural effusion and basilar atelectasis versus consolidation. Minimal atelectasis at LEFT base. Upper lungs clear. No pneumothorax. Bones demineralized. IMPRESSION: Enlargement of cardiac silhouette with pulmonary vascular congestion. RIGHT pleural effusion with basilar atelectasis and/or consolidation. Minimal LEFT basilar atelectasis. Electronically Signed   By: Lavonia Dana M.D.   On: 05/25/2021 10:03   ECHOCARDIOGRAM COMPLETE  Result Date: 05/26/2021    ECHOCARDIOGRAM REPORT   Patient Name:   ARLAND USERY Date of Exam: 05/26/2021 Medical Rec #:  [id number]       Height:       67.0 in Accession #:    [id number]      Weight:       262.0 lb Date of Birth:  1940-11-24       BSA:          2.268 m Patient Age:    40 years        BP:           144/80 mmHg Patient Gender: M               HR:           88 bpm. Exam Location:  Inpatient Procedure: 2D Echo, Cardiac Doppler and Color Doppler Indications:    Pulmonary embolus  History:        Patient has no prior history of Echocardiogram examinations.                 Signs/Symptoms:Pulmonary embolus; Risk Factors:Diabetes,                 Hypertension, Former Smoker and Obesity.  Sonographer:    Dustin Flock RDCS Referring Phys: Anthony  1. Left ventricular ejection fraction, by estimation, is 60 to 65%. The left ventricle has normal function. Left ventricular endocardial border not optimally defined to evaluate regional wall motion. The left ventricular internal cavity size was mildly to moderately dilated. Left ventricular diastolic parameters are indeterminate.  2. Right ventricular systolic function is normal. The  right ventricular size is normal.  3. The mitral valve is normal in structure. No evidence of mitral valve regurgitation.  4. The aortic valve is grossly normal. Aortic valve regurgitation is not visualized. FINDINGS  Left Ventricle: Left ventricular ejection fraction, by estimation, is 60 to 65%. The left ventricle has normal function. Left ventricular endocardial border not optimally defined to evaluate regional wall motion. The left ventricular internal cavity size was mildly to moderately dilated. There is no left ventricular hypertrophy. Left ventricular diastolic parameters are indeterminate. Right Ventricle: The right ventricular size is normal. Right vetricular wall thickness was not well visualized. Right ventricular systolic function is normal. Left Atrium: Left atrial size was normal in size. Right Atrium: Right atrial size was normal in size. Pericardium: There is no evidence of pericardial effusion. Mitral Valve: The mitral valve is normal in structure. No evidence of mitral valve regurgitation. Tricuspid Valve: The tricuspid valve is grossly normal. Tricuspid valve regurgitation is mild. Aortic Valve: The aortic valve is grossly normal. Aortic valve regurgitation is not visualized. Pulmonic Valve: The pulmonic valve was grossly normal. Pulmonic valve regurgitation is not visualized. Aorta: The aortic root and ascending aorta are structurally normal, with no evidence of dilitation. IAS/Shunts: The atrial septum is grossly normal.  LEFT VENTRICLE PLAX 2D LVIDd:         5.10 cm      Diastology LVIDs:         3.20 cm      LV e' medial:    6.96 cm/s LV PW:         1.20 cm      LV E/e' medial:  9.4 LV IVS:        1.00 cm      LV e' lateral:   6.09 cm/s LVOT diam:     2.40 cm      LV E/e' lateral: 10.7 LV SV:         79 LV SV Index:   35 LVOT Area:     4.52 cm  LV Volumes (MOD) LV vol d, MOD A4C: 157.0 ml LV vol s, MOD A4C: 66.8 ml LV SV MOD A4C:     157.0 ml RIGHT VENTRICLE RV Basal diam:  2.50 cm RV S  prime:     16.80 cm/s TAPSE (M-mode): 2.4 cm LEFT ATRIUM             Index       RIGHT ATRIUM           Index LA diam:        3.10 cm 1.37 cm/m  RA Area:     13.80 cm LA Vol (A2C):   65.9 ml 29.05 ml/m RA Volume:   36.60 ml  16.14 ml/m  LA Vol (A4C):   44.3 ml 19.53 ml/m LA Biplane Vol: 54.5 ml 24.03 ml/m  AORTIC VALVE LVOT Vmax:   110.00 cm/s LVOT Vmean:  64.400 cm/s LVOT VTI:    0.174 m  AORTA Ao Root diam: 2.90 cm MITRAL VALVE MV Area (PHT): 3.33 cm    SHUNTS MV Decel Time: 228 msec    Systemic VTI:  0.17 m MV E velocity: 65.10 cm/s  Systemic Diam: 2.40 cm MV A velocity: 78.40 cm/s MV E/A ratio:  0.83 Mertie Moores MD Electronically signed by Mertie Moores MD Signature Date/Time: 05/26/2021/1:17:37 PM    Final    ECHOCARDIOGRAM LIMITED BUBBLE STUDY  Result Date: 06/10/2021    ECHOCARDIOGRAM LIMITED REPORT   Patient Name:   KEYLIN PODOLSKY Date of Exam: 06/10/2021 Medical Rec #:  [id number]       Height:       67.0 in Accession #:    [id number]      Weight:       233.0 lb Date of Birth:  Dec 27, 1940       BSA:          2.158 m Patient Age:    89 years        BP:           132/59 mmHg Patient Gender: M               HR:           103 bpm. Exam Location:  Inpatient Procedure: Limited Echo and Saline Contrast Bubble Study Indications:    Stroke 434.91 / I63.9  History:        Patient has prior history of Echocardiogram examinations, most                 recent 05/26/2021. Risk Factors:Diabetes and Hypertension.  Sonographer:    Bernadene Person RDCS Referring Phys: [id number] [place]  1. Limited study to evaluate for interatrial shunt  2. Agitated saline contrast bubble study was negative, with no evidence of any interatrial shunt. Technically difficult study, but no shunting seen FINDINGS  IAS/Shunts: Agitated saline contrast was given intravenously to evaluate for intracardiac shunting. Agitated saline contrast bubble study was negative, with no evidence of any interatrial shunt.  Oswaldo Milian MD Electronically signed by Oswaldo Milian MD Signature Date/Time: 06/10/2021/8:36:09 PM    Final    VAS [place] LOWER EXTREMITY VENOUS (DVT)  Result Date: 05/27/2021  Lower Venous DVT Study Patient Name:  EFREN KROSS  Date of Exam:   05/27/2021 Medical Rec #: [id number]        Accession #:    [id number] Date of Birth: 03-20-1941        Patient Gender: M Patient Age:   37 years Exam Location:  [facility] Procedure:      VAS [place] LOWER EXTREMITY VENOUS (DVT) Referring Phys: Brand Males --------------------------------------------------------------------------------  Indications: Pulmonary embolism. Other Indications: Incrasing shortness of breath x1 week. Comparison Study: No previous exams Performing Technologist: Jody Hill RVT, RDMS  Examination Guidelines: A complete evaluation includes B-mode imaging, spectral Doppler, color Doppler, and power Doppler as needed of all accessible portions of each vessel. Bilateral testing is considered an integral part of a complete examination. Limited examinations for reoccurring indications may be performed as noted. The reflux portion of the exam is performed with the patient in reverse Trendelenburg.  +---------+---------------+---------+-----------+----------+--------------+ RIGHT    CompressibilityPhasicitySpontaneityPropertiesThrombus Aging +---------+---------------+---------+-----------+----------+--------------+ CFV      Full  Yes      Yes                                 +---------+---------------+---------+-----------+----------+--------------+ SFJ      Full                                                        +---------+---------------+---------+-----------+----------+--------------+ FV Prox  Full           Yes      Yes                                 +---------+---------------+---------+-----------+----------+--------------+ FV Mid   Full           Yes      Yes                                  +---------+---------------+---------+-----------+----------+--------------+ FV DistalFull           Yes      Yes                                 +---------+---------------+---------+-----------+----------+--------------+ PFV      Full                                                        +---------+---------------+---------+-----------+----------+--------------+ POP      Full           Yes      Yes                                 +---------+---------------+---------+-----------+----------+--------------+ PTV      Full                                                        +---------+---------------+---------+-----------+----------+--------------+ PERO     Full                                                        +---------+---------------+---------+-----------+----------+--------------+   +---------+---------------+---------+-----------+----------+--------------+ LEFT     CompressibilityPhasicitySpontaneityPropertiesThrombus Aging +---------+---------------+---------+-----------+----------+--------------+ CFV      Full           Yes      Yes                                 +---------+---------------+---------+-----------+----------+--------------+ SFJ      Full                                                        +---------+---------------+---------+-----------+----------+--------------+  FV Prox  Full           Yes      Yes                                 +---------+---------------+---------+-----------+----------+--------------+ FV Mid   Full           Yes      Yes                                 +---------+---------------+---------+-----------+----------+--------------+ FV DistalFull           Yes      Yes                                 +---------+---------------+---------+-----------+----------+--------------+ PFV      Full                                                         +---------+---------------+---------+-----------+----------+--------------+ POP      Full           Yes      Yes                                 +---------+---------------+---------+-----------+----------+--------------+ PTV      Full                                                        +---------+---------------+---------+-----------+----------+--------------+ PERO     None           No       No                   Acute          +---------+---------------+---------+-----------+----------+--------------+     Summary: BILATERAL: - No evidence of superficial venous thrombosis in the lower extremities, bilaterally. -No evidence of popliteal cyst, bilaterally. RIGHT: - There is no evidence of deep vein thrombosis in the lower extremity.  LEFT: - Findings consistent with acute deep vein thrombosis involving the left peroneal veins.  *See table(s) above for measurements and observations. Electronically signed by Jamelle Haring on 05/27/2021 at 5:52:25 PM.    Final    US THORACENTESIS ASP PLEURAL SPACE W/IMG GUIDE  Result Date: 06/05/2021 INDICATION: Patient with history of lung cancer, pulmonary embolus, malignant right pleural effusion. Request received for diagnostic and therapeutic right thoracentesis. EXAM: ULTRASOUND GUIDED DIAGNOSTIC AND THERAPEUTIC RIGHT THORACENTESIS MEDICATIONS: 1% lidocaine to skin and subcutaneous tissue COMPLICATIONS: None immediate. PROCEDURE: An ultrasound guided thoracentesis was thoroughly discussed with the patient and questions answered. The benefits, risks, alternatives and complications were also discussed. The patient understands and wishes to proceed with the procedure. Written consent was obtained. Ultrasound was performed to localize and mark an adequate pocket of fluid in the right chest. The area was then prepped and draped in the normal sterile fashion. 1% Lidocaine was used for local anesthesia. Under  ultrasound guidance a 6 Fr Safe-T-Centesis catheter  was introduced. Thoracentesis was performed. The catheter was removed and a dressing applied. FINDINGS: A total of approximately 20 cc of amber fluid was removed. Samples were sent to the laboratory as requested by the clinical team. Minimal free fluid was noted on today's ultrasound of right pleural space. Despite catheter manipulation only the above amount of fluid could be removed today. IMPRESSION: Successful ultrasound guided diagnostic and therapeutic right thoracentesis yielding 20 cc of pleural fluid. Read by: Rowe Robert, PA-C Electronically Signed   By: Aletta Edouard M.D.   On: 06/05/2021 12:39   US THORACENTESIS ASP PLEURAL SPACE W/IMG GUIDE  Result Date: 05/26/2021 INDICATION: Patient with history of right lung mass, pulmonary embolus, dyspnea, right pleural effusion. Request received for diagnostic and therapeutic right thoracentesis. EXAM: ULTRASOUND GUIDED DIAGNOSTIC AND THERAPEUTIC RIGHT THORACENTESIS MEDICATIONS: 1% lidocaine to skin and subcutaneous tissue COMPLICATIONS: None immediate. PROCEDURE: An ultrasound guided thoracentesis was thoroughly discussed with the patient and questions answered. The benefits, risks, alternatives and complications were also discussed. The patient understands and wishes to proceed with the procedure. Written consent was obtained. Ultrasound was performed to localize and mark an adequate pocket of fluid in the right chest. The area was then prepped and draped in the normal sterile fashion. 1% Lidocaine was used for local anesthesia. Under ultrasound guidance a 6 Fr Safe-T-Centesis catheter was introduced. Thoracentesis was performed. The catheter was removed and a dressing applied. FINDINGS: A total of approximately 1.3 liters of blood-tinged fluid was removed. Samples were sent to the laboratory as requested by the clinical team. IMPRESSION: Successful ultrasound guided diagnostic and therapeutic right thoracentesis yielding 1.3 liters of pleural fluid. Read  by: Rowe Robert, PA-C Electronically Signed   By: Markus Daft M.D.   On: 05/26/2021 15:10    MRI brain, CVA work-up.   Subjective: Patient was seen and examined at bedside.  Overnight events noted.  Patient remains on BiPAP overnight , wife reported he slept well.   He is feeling better,  Patient is being discharged home with home hospice today.  Discharge Exam: Vitals:   06/18/21 0332 06/18/21 1000  BP: 98/64 (!) 128/59  Pulse: 85 (!) 106  Resp: (!) 26 (!) 29  Temp: 98.9 F (37.2 C)   SpO2: 99% 100%   Vitals:   06/17/21 2342 06/18/21 0332 06/18/21 0500 06/18/21 1000  BP:  98/64  (!) 128/59  Pulse:  85  (!) 106  Resp:  (!) 26  (!) 29  Temp:  98.9 F (37.2 C)    TempSrc:  Axillary    SpO2: 100% 99%  100%  Weight:   103.9 kg   Height:        General: Appears comfortable, not in any acute distress.   Cardiovascular: Regular rate and rhythm, no murmur  Respiratory: CTA bilaterally, no wheezing, no rhonchi Abdominal: Soft, NT, ND, bowel sounds + Extremities: no edema, no cyanosis    The results of significant diagnostics from this hospitalization (including imaging, microbiology, ancillary and laboratory) are listed below for reference.     Microbiology: Recent Results (from the past 240 hour(s))  Culture, blood (routine x 2)     Status: None   Collection Time: 06/11/21  3:12 AM   Specimen: BLOOD  Result Value Ref Range Status   Specimen Description BLOOD LEFT ANTECUBITAL  Final   Special Requests   Final    BOTTLES DRAWN AEROBIC AND ANAEROBIC Blood Culture adequate volume   Culture  Final    NO GROWTH 5 DAYS Performed at [facility], [place] [address] [place], [place] [address]    Report Status 06/16/2021 FINAL  Final  Culture, blood (routine x 2)     Status: None   Collection Time: 06/11/21  3:28 AM   Specimen: BLOOD LEFT HAND  Result Value Ref Range Status   Specimen Description BLOOD LEFT HAND  Final   Special Requests   Final    BOTTLES DRAWN  AEROBIC AND ANAEROBIC Blood Culture adequate volume   Culture   Final    NO GROWTH 5 DAYS Performed at [facility], [place] [address], [place], [place] [address]    Report Status 06/16/2021 FINAL  Final     Labs: BNP (last 3 results) Recent Labs    05/25/21 0947 06/04/21 1635 06/11/21 0312  BNP 10.9 490.3* 24.5   Basic Metabolic Panel: Recent Labs  Lab 06/13/21 0238 06/14/21 0323 06/15/21 0358 06/16/21 0343 06/17/21 0358  NA 140 140 139 140 140  K 4.3 4.2 4.2 4.4 3.9  CL 109 108 105 104 108  CO2 21* 21* 22 21* 19*  GLUCOSE 103* 111* 109* 119* 190*  BUN 40* 37* 39* 44* 42*  CREATININE 1.61* 1.50* 1.54* 1.50* 1.41*  CALCIUM 8.5* 8.7* 8.9 9.0 8.6*  MG 1.7 1.9  --   --   --    Liver Function Tests: Recent Labs  Lab 06/12/21 0314 06/13/21 0238 06/14/21 0323 06/15/21 0358 06/16/21 0343  AST 48* 42* 41 39 55*  ALT 67* 52* 41 33 30  ALKPHOS 114 102 101 92 84  BILITOT 1.1 1.0 1.0 1.1 1.1  PROT 5.9* 6.1* 6.4* 6.4* 6.1*  ALBUMIN 1.6* 1.5* 1.6* 1.8* 1.7*   No results for input(s): LIPASE, AMYLASE in the last 168 hours. No results for input(s): AMMONIA in the last 168 hours. CBC: Recent Labs  Lab 06/12/21 0314 06/13/21 0238 06/15/21 0358 06/16/21 0343 06/17/21 0358  WBC 17.5* 16.8* 17.5* 17.5* 19.0*  HGB 9.2* 9.3* 9.1* 8.9* 8.6*  HCT 28.8* 28.8* 28.4* 27.9* 27.3*  MCV 85.7 86.5 86.9 87.2 86.9  PLT 467* 498* 484* 441* 464*   Cardiac Enzymes: No results for input(s): CKTOTAL, CKMB, CKMBINDEX, TROPONINI in the last 168 hours. BNP: Invalid input(s): POCBNP CBG: Recent Labs  Lab 06/17/21 1152 06/17/21 1632 06/17/21 2100 06/18/21 0620 06/18/21 1154  GLUCAP 204* 202* 119* 212* 132*   D-Dimer No results for input(s): DDIMER in the last 72 hours. Hgb A1c No results for input(s): HGBA1C in the last 72 hours. Lipid Profile No results for input(s): CHOL, HDL, LDLCALC, TRIG, CHOLHDL, LDLDIRECT in the last 72 hours. Thyroid function studies No results  for input(s): TSH, T4TOTAL, T3FREE, THYROIDAB in the last 72 hours.  Invalid input(s): FREET3 Anemia work up No results for input(s): VITAMINB12, FOLATE, FERRITIN, TIBC, IRON, RETICCTPCT in the last 72 hours. Urinalysis    Component Value Date/Time   COLORURINE YELLOW (A) 06/04/2021 1812   APPEARANCEUR CLOUDY (A) 06/04/2021 1812   LABSPEC 1.020 06/04/2021 1812   PHURINE 5.5 06/04/2021 1812   GLUCOSEU NEGATIVE 06/04/2021 1812   HGBUR TRACE (A) 06/04/2021 1812   BILIRUBINUR NEGATIVE 06/04/2021 1812   KETONESUR NEGATIVE 06/04/2021 1812   PROTEINUR TRACE (A) 06/04/2021 1812   NITRITE NEGATIVE 06/04/2021 1812   LEUKOCYTESUR NEGATIVE 06/04/2021 1812   Sepsis Labs Invalid input(s): PROCALCITONIN,  WBC,  LACTICIDVEN Microbiology Recent Results (from the past 240 hour(s))  Culture, blood (routine x 2)  Status: None   Collection Time: 06/11/21  3:12 AM   Specimen: BLOOD  Result Value Ref Range Status   Specimen Description BLOOD LEFT ANTECUBITAL  Final   Special Requests   Final    BOTTLES DRAWN AEROBIC AND ANAEROBIC Blood Culture adequate volume   Culture   Final    NO GROWTH 5 DAYS Performed at [facility], [address], [place], [place] [address]    Report Status 06/16/2021 FINAL  Final  Culture, blood (routine x 2)     Status: None   Collection Time: 06/11/21  3:28 AM   Specimen: BLOOD LEFT HAND  Result Value Ref Range Status   Specimen Description BLOOD LEFT HAND  Final   Special Requests   Final    BOTTLES DRAWN AEROBIC AND ANAEROBIC Blood Culture adequate volume   Culture   Final    NO GROWTH 5 DAYS Performed at [facility], [place] [address] [place], [place] [address]    Report Status 06/16/2021 FINAL  Final     Time coordinating discharge: Over 30 minutes  SIGNED:   Shawna Clamp, MD  [facility] 06/18/2021, 1:59 PM Pager   If 7PM-7AM, please contact night-coverage

## 2021-06-18 NOTE — Progress Notes (Signed)
Discharge instructions given. Patient' wife verbalized understanding and all questions were answered.

## 2021-06-18 NOTE — TOC Transition Note (Signed)
Transition of Care [facility]) - CM/SW Discharge Note   Patient Details  Name: Kevin Hobbs MRN: [id number] Date of Birth: [birth year]  [facility]) CM/SW Contact:  Pollie Friar, RN Phone Number: 06/18/2021, 9:03 AM   Clinical Narrative:    Patient is discharging home with hospice services. Wife is at the bedside and aware. [facility] to provide transport home. Bedside RN aware and d/c packet is at the desk. Cm has updated [facility] on timing.    Final next level of care: Home w Hospice Care Barriers to Discharge: No Barriers Identified   Patient Goals and CMS Choice   CMS Medicare.gov Compare Post Acute Care list provided to:: Patient Represenative (must comment) Choice offered to / list presented to : Spouse  Discharge Placement                       Discharge Plan and Services                DME Arranged: Hospital bed DME Agency: [employer]       HH Arranged: RN, PT [facility] Agency: [place] [facility] Date [place]: 06/09/21 Time [place]: 1414 Representative spoke with at [place]: [place] ([place]) Interventions     Readmission Risk Interventions No flowsheet data found.

## 2021-06-18 NOTE — Progress Notes (Signed)
Daily Progress Note   Patient Name: Kevin Hobbs       Date: 06/18/2021 DOB: 1940/12/18  Age: 80 y.o. MRN#: [id number] Attending Physician: Shawna Clamp, MD Primary Care Physician: Robyne Peers, MD Admit Date: 06/04/2021  Reason for Consultation/Follow-up: symptom management  Subjective: Patient reports he slept "much better" last night after receiving ativan. He has no other complaints. He is looking forward to going home.   Wife Kevin Hobbs is at bedside. Discussed that I would send him with Rx for ativan that he could take up to 3 times daily as needed for anxiety or to help him sleep at night. Also discussed sending him with an Rx for a low-dose opioid that he could take as needed for pain or dyspnea. Patient and wife express appreciation for PMT support.   Additional time spent communicating with Dr. Dwyane Dee and bedside RN regarding new discharge medications and to ensure patient received updated AVS.   Length of Stay: 14  Current Medications: Scheduled Meds:   apixaban  5 mg Oral BID   aspirin EC  81 mg Oral Daily   feeding supplement  237 mL Oral BID BM   insulin aspart  0-15 Units Subcutaneous TID WC   insulin aspart  0-5 Units Subcutaneous QHS   LORazepam  0.5 mg Oral QHS   mouth rinse  15 mL Mouth Rinse BID   mirtazapine  7.5 mg Oral QHS   multivitamin with minerals  1 tablet Oral Daily   pantoprazole  40 mg Oral Daily   simvastatin  10 mg Oral Daily    Continuous Infusions:  piperacillin-tazobactam (ZOSYN)  IV 3.375 g (06/18/21 0028)    PRN Meds: acetaminophen **OR** acetaminophen, albuterol, chlorpheniramine-HYDROcodone, guaiFENesin-dextromethorphan           Vital Signs: BP (!) 128/59   Pulse (!) 106   Temp 98.9 F (37.2 C) (Axillary)   Resp (!) 29   Ht  5\' 7"  (1.702 m)   Wt 103.9 kg   SpO2 100%   BMI 35.88 kg/m  SpO2: SpO2: 100 % O2 Device: O2 Device: Bi-PAP O2 Flow Rate: O2 Flow Rate (L/min): 4 L/min  Intake/output summary:  Intake/Output Summary (Last 24 hours) at 06/18/2021 1215 Last data filed at 06/18/2021 1135 Gross per 24 hour  Intake --  Output 675 ml  Net -675 ml   LBM: Last BM Date: 06/17/21 Baseline Weight: Weight: 107 kg Most recent weight: Weight: 103.9 kg       Palliative Assessment/Data: PPS 30-40%      Palliative Care Assessment & Plan   HPI/Patient Profile: 80 y.o. male  with past medical history of recently diagnosed acute pulmonary embolism, right pleural effusion, right middle lobe lung mass, recently diagnosed acute left lower extremity DVT, and type 2 diabetes. He was admitted to [facility] hospital on 06/04/2021 with acute on chronic hypoxic respiratory failure after presenting from home to [facility] emergency department complaining of shortness of breath.    Patient was recently hospitalized 05/25/21 - 05/27/21 at [facility] for acute hypoxic respiratory failure. Work-up included CTA chest which showed evidence of acute pulmonary embolism, right pleural effusion, and right middle lobe lung mass (which was reportedly new). 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   PET scan was done 06/03/21 and showed evidence of metastatic disease.    Assessment: - acute on chronic hypoxic respiratory failure - post obstructive pneumonia - pulmonary embolism - right middle lobe carcinoma, malignant pleural effusion - acute CVA, incidental - acute kidney injury  Recommendations/Plan: DNR/DNI Pending discharge home with hospice today RX provided for comfort meds at discharge: Lorazepam 0.5 mg every 8 hours as needed for anxiety or sleep Oxycodone IR 5 mg every 4 hours as needed for pain or shortness of breath  Goals of Care and Additional  Recommendations: Limitations on Scope of Treatment: No radiation, avoid rehospitalization  Code Status: DNR/DNI    Prognosis:  Weeks to months  Discharge Planning: Home with Hospice    Thank you for allowing the Palliative Medicine Team to assist in the care of this patient.   Total Time 25 minutes Prolonged Time Billed  no       Greater than 50%  of this time was spent counseling and coordinating care related to the above assessment and plan.  Lavena Bullion, NP  Please contact Palliative Medicine Team phone at [phone] for questions and concerns.

## 2021-06-24 ENCOUNTER — Telehealth: Payer: Self-pay

## 2021-06-24 NOTE — Telephone Encounter (Signed)
Hi Angie,   I was calling to schedule this patient for a hosp. Follow up and his wife informed me that he passed on 07/12/2023. His chart does need to be updated

## 2021-07-03 DEATH — deceased

## 2021-07-14 ENCOUNTER — Ambulatory Visit: Payer: PPO | Admitting: Pulmonary Disease

## 2021-07-20 LAB — ACID FAST CULTURE WITH REFLEXED SENSITIVITIES (MYCOBACTERIA): Acid Fast Culture: NEGATIVE

## 2021-11-15 IMAGING — CT CT ANGIO CHEST
2 of 8 series · 18 of 36 positions shown · IV contrast (Omnipaque)
Comparison: None.

CLINICAL DATA: Shortness of breath

EXAM:
CT ANGIOGRAPHY CHEST WITH CONTRAST
TECHNIQUE: Multidetector CT imaging of the chest was performed using the
standard protocol during bolus administration of intravenous
contrast. Multiplanar CT image reconstructions and MIPs were
obtained to evaluate the vascular anatomy.
CONTRAST:  80mL OMNIPAQUE IOHEXOL 350 MG/ML SOLN

[Series 6: pe coronal mpr · coronal · 0.63mm/px · 1 of 162 slices shown]
[im 81/162  mediastinal]
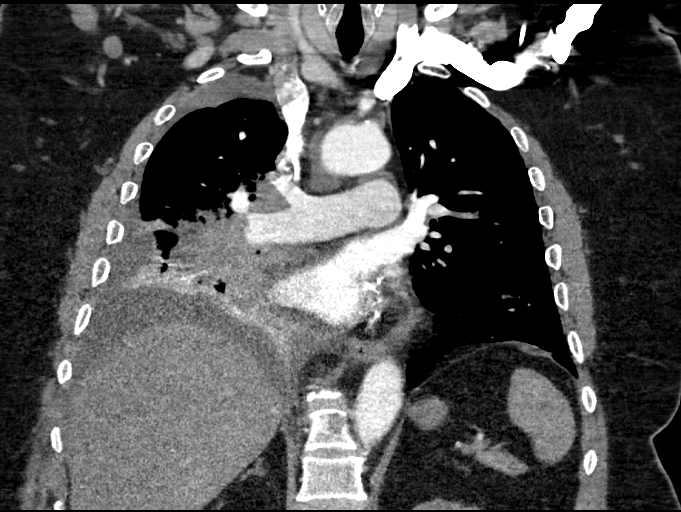

[Series 10: pe thins · axial · 0.82mm/px · z∈[-356,-69]mm · 17 of 321 slices shown]
[im 17/321  lung]
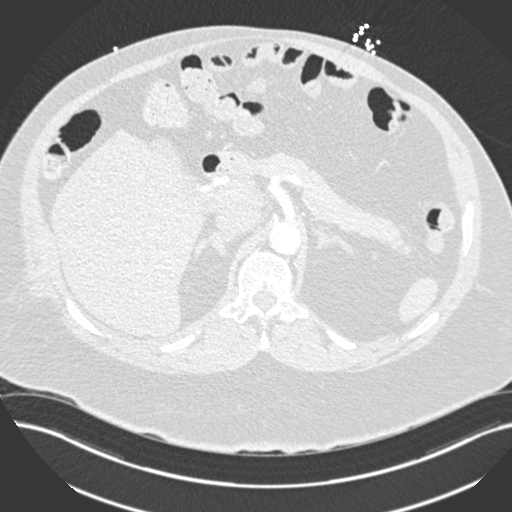
[im 34/321  mediastinal]
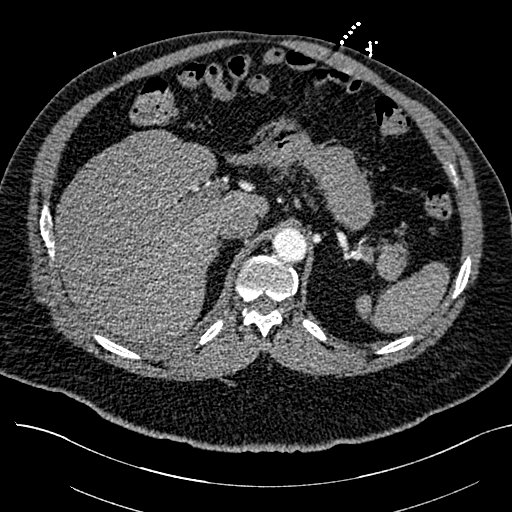
[im 51/321  lung]
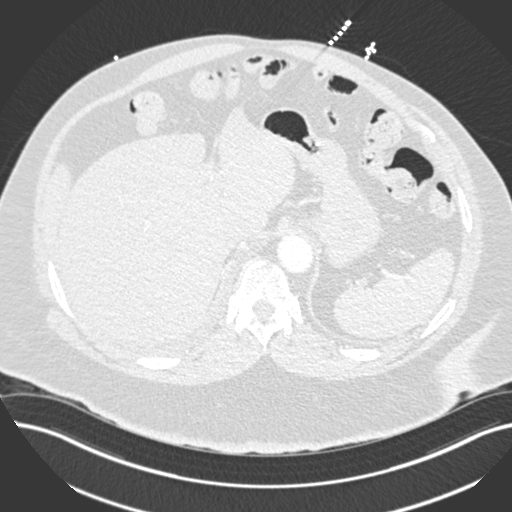
[im 68/321  mediastinal]
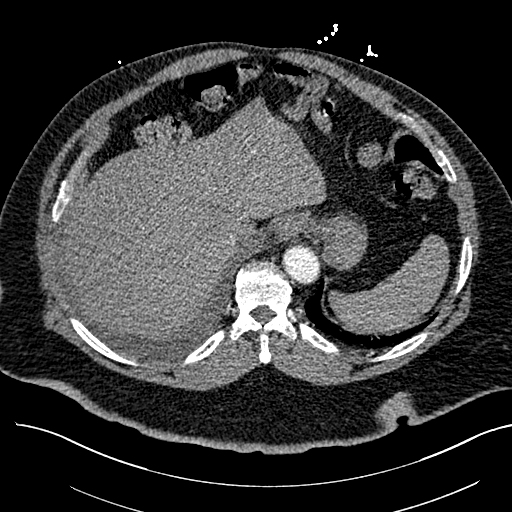
[im 85/321  lung]
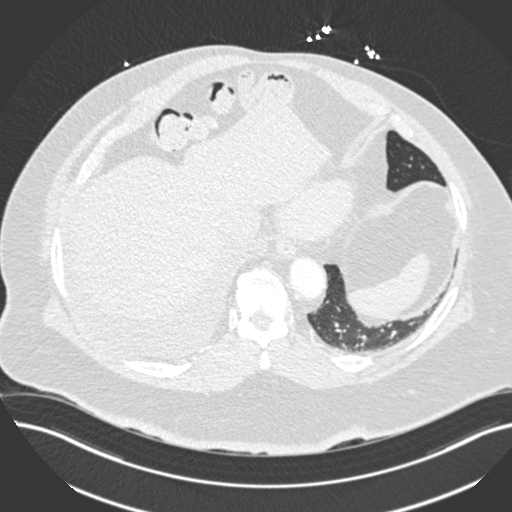
[im 102/321  mediastinal]
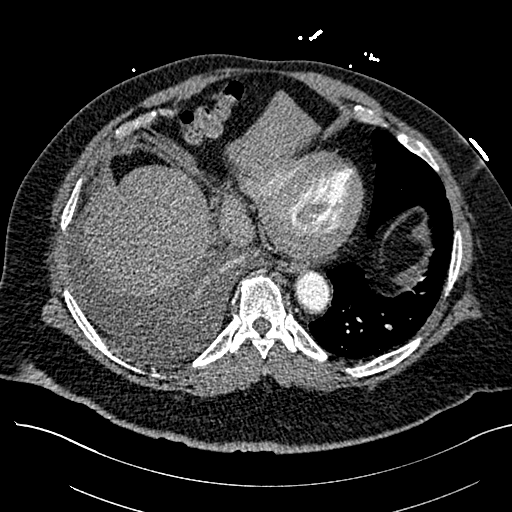
[im 118/321  lung]
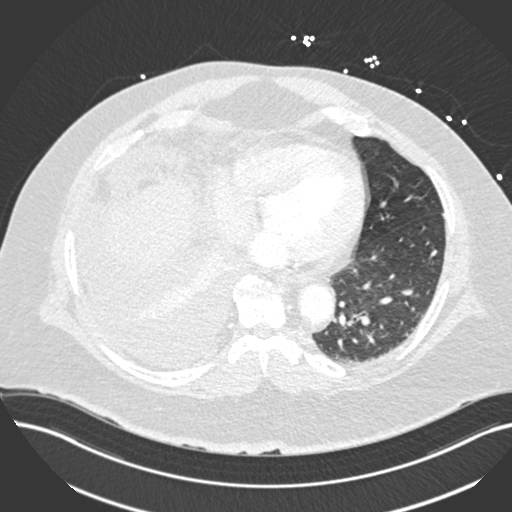
[im 135/321  mediastinal]
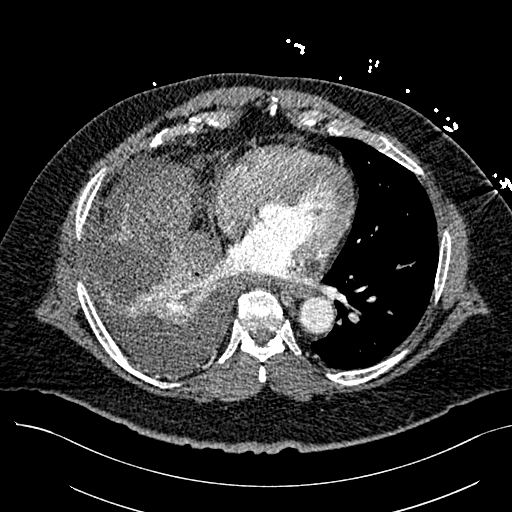
[im 169/321  lung]
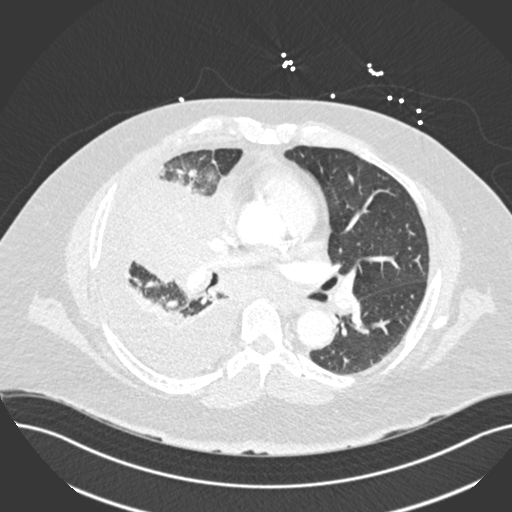
[im 186/321  mediastinal]
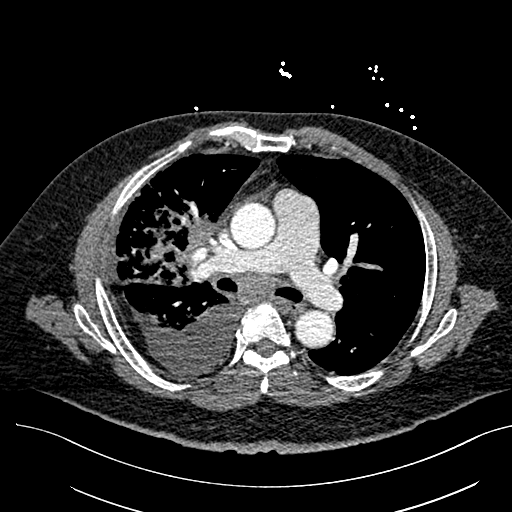
[im 203/321  lung]
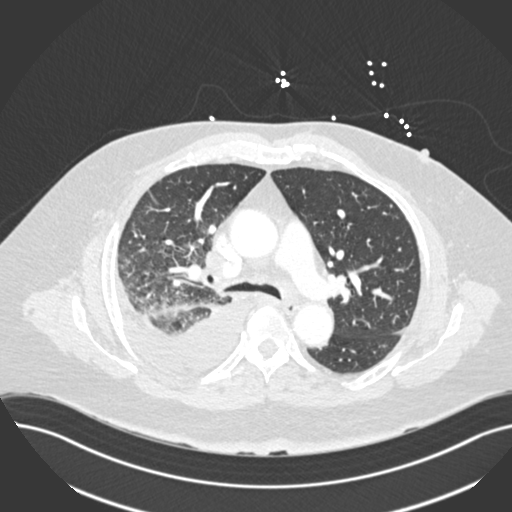
[im 219/321  mediastinal]
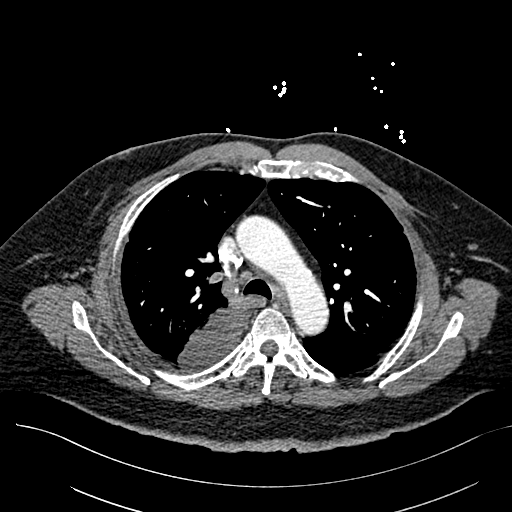
[im 236/321  lung]
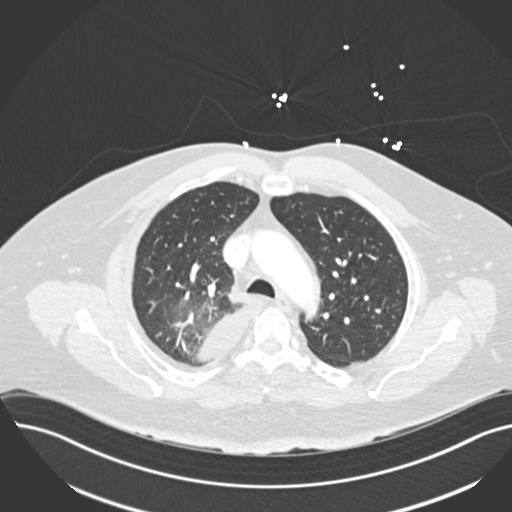
[im 253/321  mediastinal]
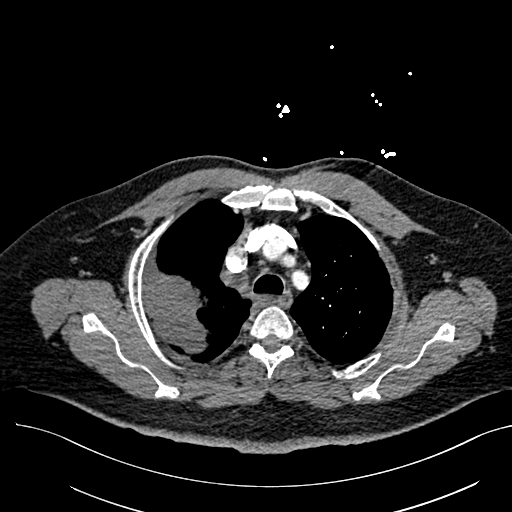
[im 270/321  lung]
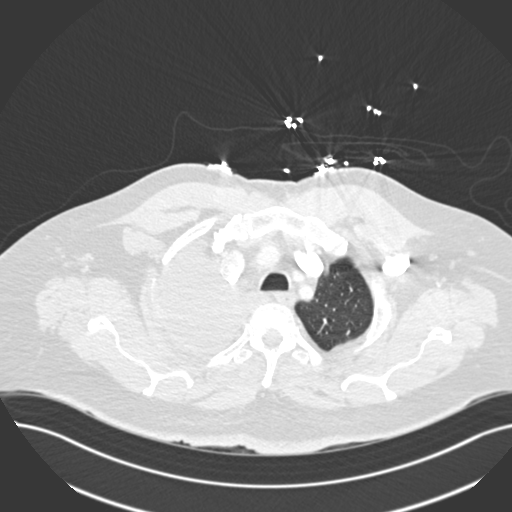
[im 287/321  mediastinal]
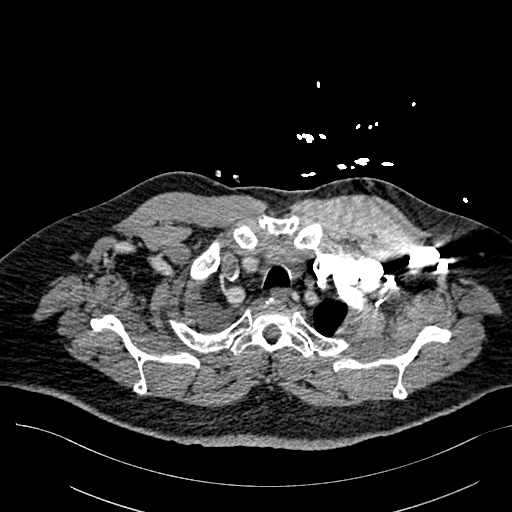
[im 304/321  lung]
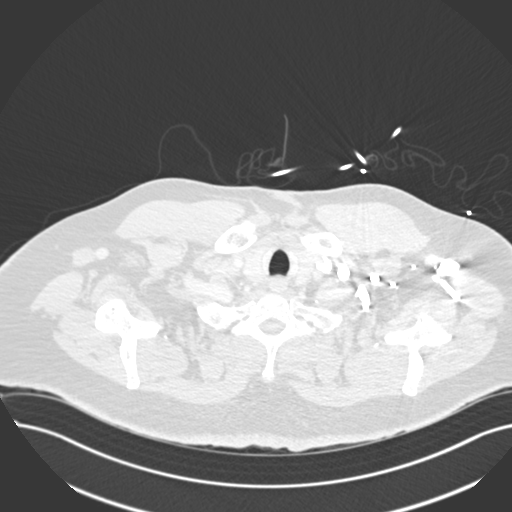

[18 of 36 positions shown; findings below may reference images not displayed]

FINDINGS: Cardiovascular: Examination for pulmonary embolism is limited by
marginal contrast bolus, main pulmonary artery = 141 HU. Within this
limitation, positive examination for pulmonary embolism, with
segmental to subsegmental embolus present in the right upper lobe
(series 4, image 37). Normal heart size. Three-vessel coronary
artery calcifications. No pericardial effusion. Aortic
atherosclerosis. Enlargement of the main pulmonary artery measuring
up to 3.6 cm in caliber. RV LV ratio is preserved, approximately
0.6.

Mediastinum/Nodes: Markedly enlarged subcarinal nodes measuring at
least 6.1 x 2.6 cm (series 4, image 52) thyroid gland, trachea, and
esophagus demonstrate no significant findings.

Lungs/Pleura: Moderate right pleural effusion associated atelectasis
or consolidation. There is a large mass centered in the right middle
lobe, which occludes the middle lobe segmental bronchi, although
difficult to distinguish from airspace consolidation and adjacent
atelectasis, this measures approximately 7.9 x 7.7 cm (series 4,
image 55).

Upper Abdomen: No acute abnormality. Gallstones and or sludge in the
dependent gallbladder.

Musculoskeletal: No chest wall abnormality. No acute or significant
osseous findings.

Review of the MIP images confirms the above findings.
IMPRESSION: 1. Positive examination for pulmonary embolism, with segmental to
subsegmental embolus present in the right upper lobe.
2. Enlargement of the main pulmonary artery, concerning for
pulmonary hypertension. No elevation of the RV LV ratio.
3. There is a large mass centered in the right middle lobe, which
occludes the middle lobe segmental bronchi, although difficult to
distinguish from airspace consolidation and adjacent atelectasis,
this measures approximately 7.9 x 7.7 cm. Findings are most
consistent with primary lung malignancy.
4. Markedly enlarged subcarinal lymph nodes, concerning for nodal
metastatic disease.
5. Moderate right pleural effusion and associated atelectasis or
consolidation, presumably malignant, however without direct evidence
of pleural metastatic disease.
6. Coronary artery disease.

These results were called by telephone at the time of interpretation
on 05/25/2021 at [DATE] to Dr. BORODE CEASAR , who verbally
acknowledged these results.

Aortic Atherosclerosis ([id number]-[id number].E).

## 2021-11-15 IMAGING — DX DG CHEST 1V PORT
1 series · 1 of 1 positions shown · non-contrast
Comparison: Portable exam 9269 hours without priors for comparison

CLINICAL DATA: Breath for 1 week, hypertension, RIGHT pleural
effusion by abdominal ultrasound

EXAM:
PORTABLE CHEST 1 VIEW

[chest ap]
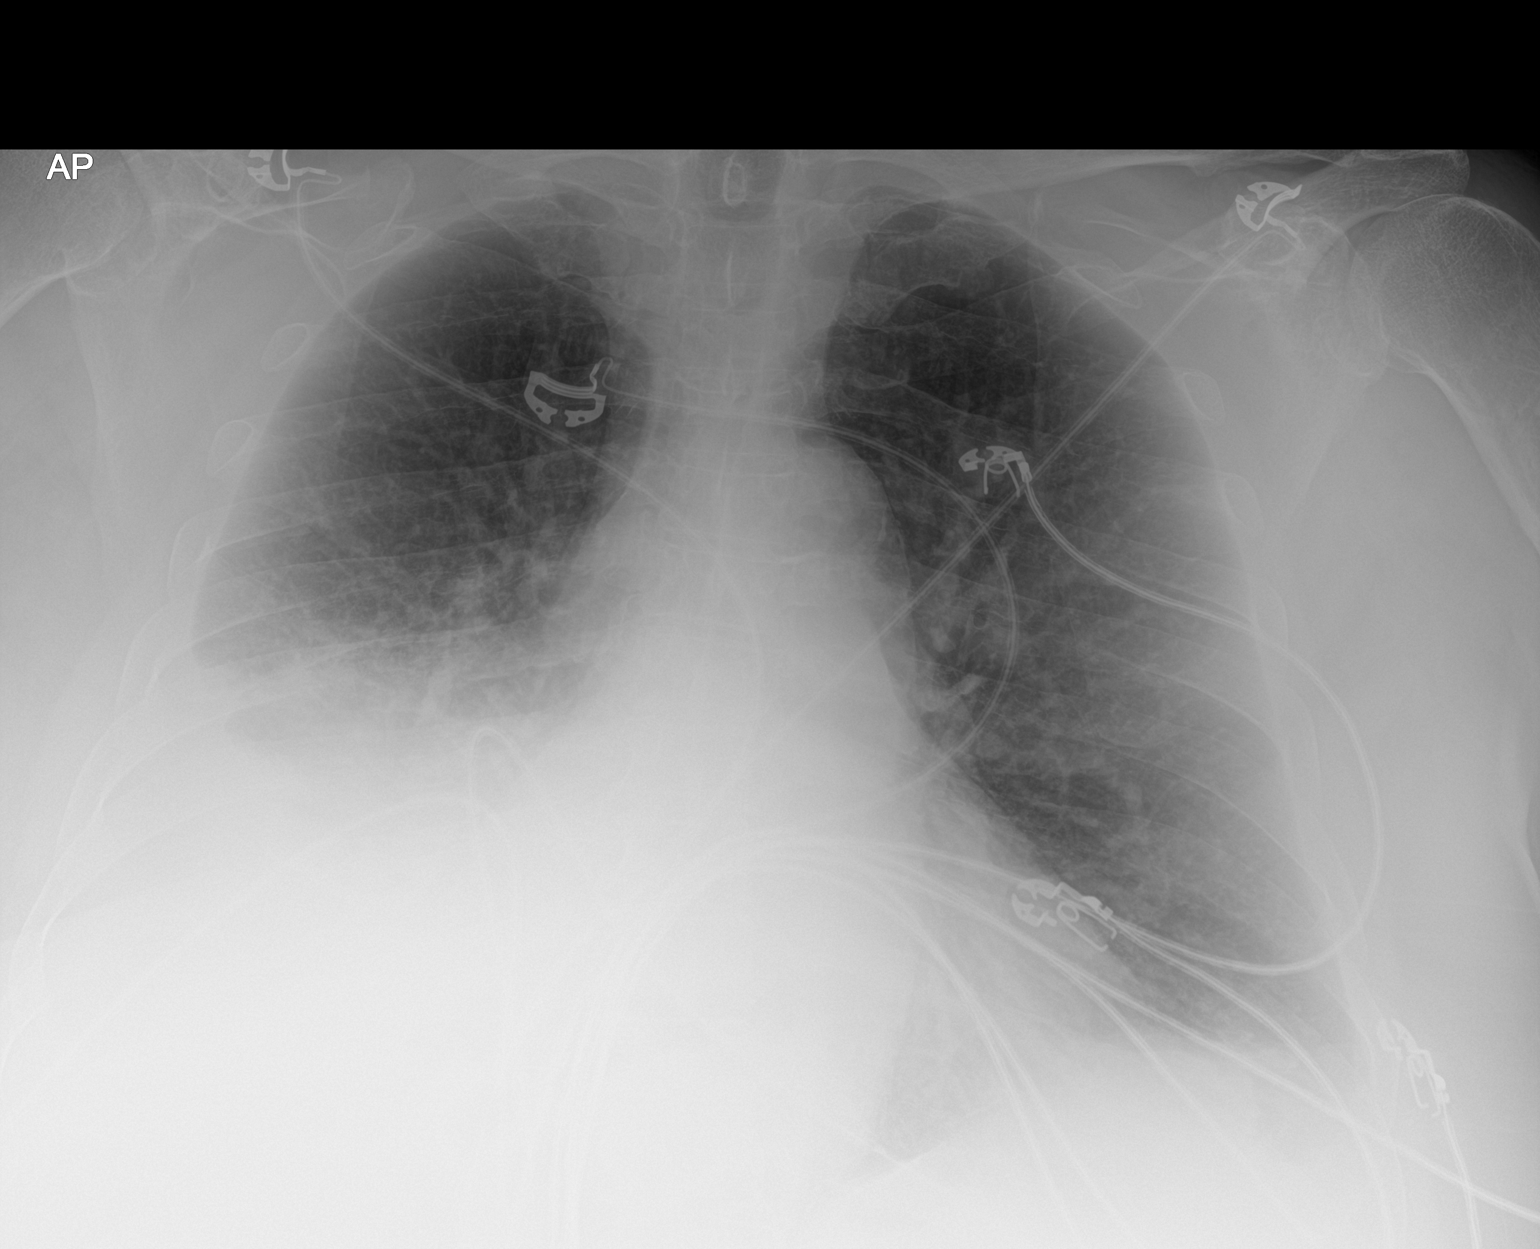

[1 of 1 positions shown; findings below may reference images not displayed]

FINDINGS: Enlargement of cardiac silhouette with pulmonary vascular
congestion.

Atherosclerotic calcification aorta.

RIGHT pleural effusion and basilar atelectasis versus consolidation.

Minimal atelectasis at LEFT base.

Upper lungs clear.

No pneumothorax.

Bones demineralized.
IMPRESSION: Enlargement of cardiac silhouette with pulmonary vascular
congestion.

RIGHT pleural effusion with basilar atelectasis and/or
consolidation.

Minimal LEFT basilar atelectasis.

## 2021-11-16 IMAGING — US US THORACENTESIS ASP PLEURAL SPACE W/IMG GUIDE
1 series · 4 of 4 positions shown · non-contrast
Comparison: none

INDICATION: Patient with history of right lung mass, pulmonary embolus, dyspnea,
right pleural effusion. Request received for diagnostic and
therapeutic right thoracentesis.

[Series 1: us thoracentesis asp pleural space w/img guide · 4 of 4 slices shown]
[im 1/4]
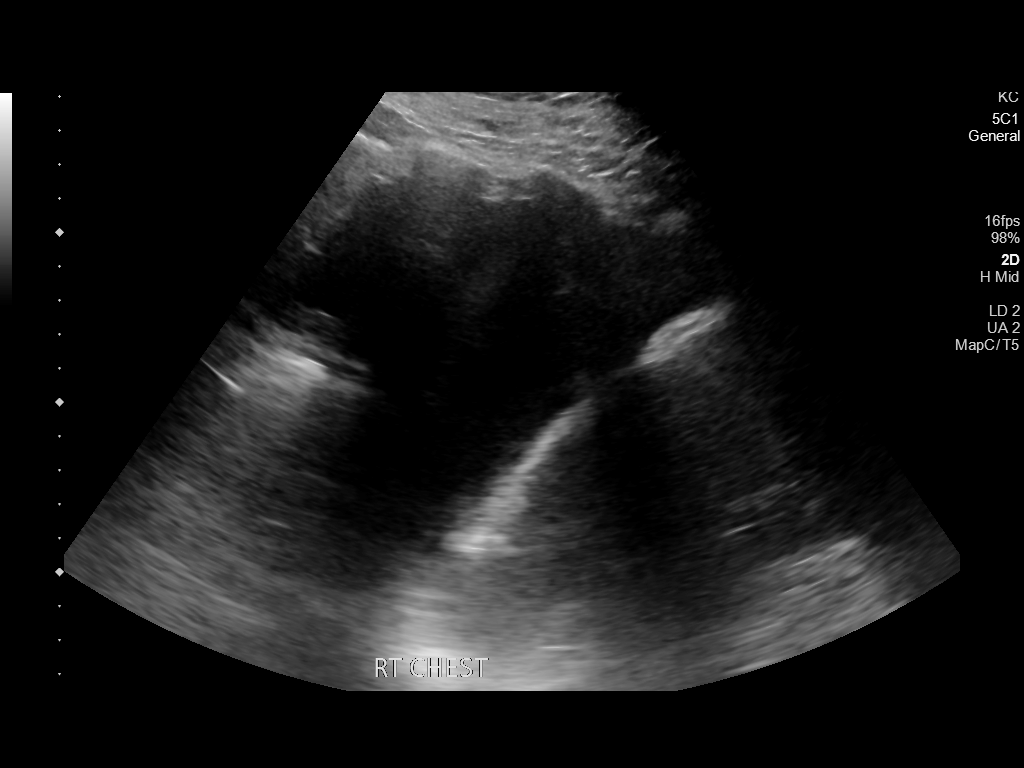
[im 2/4]
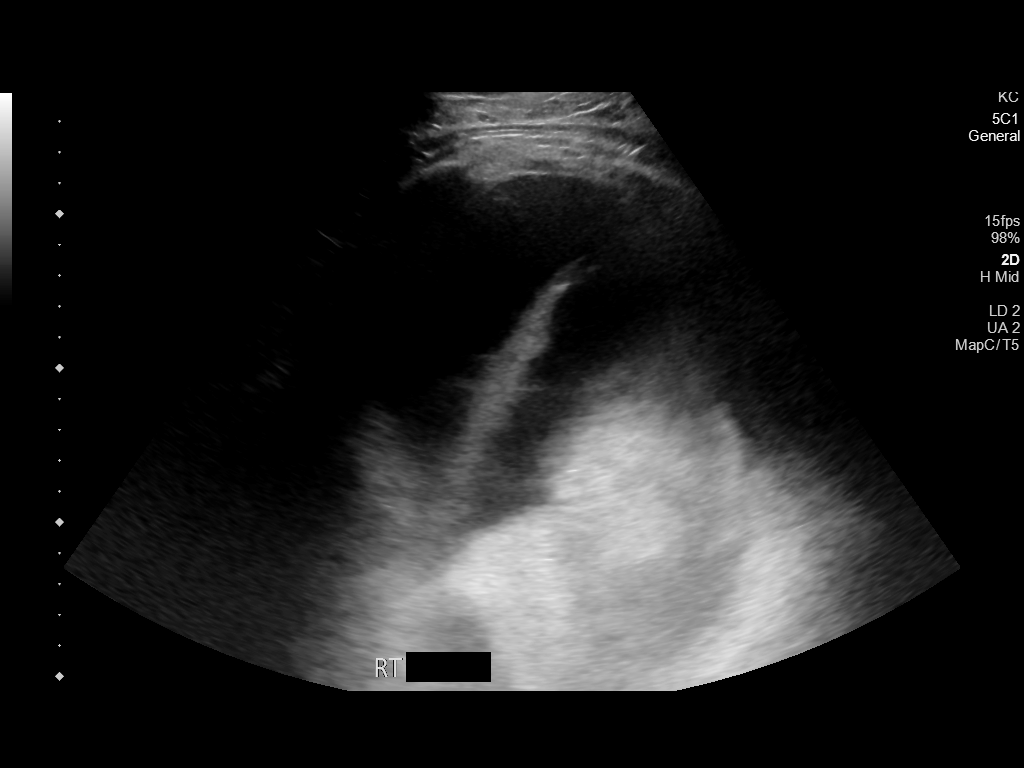
[im 3/4]
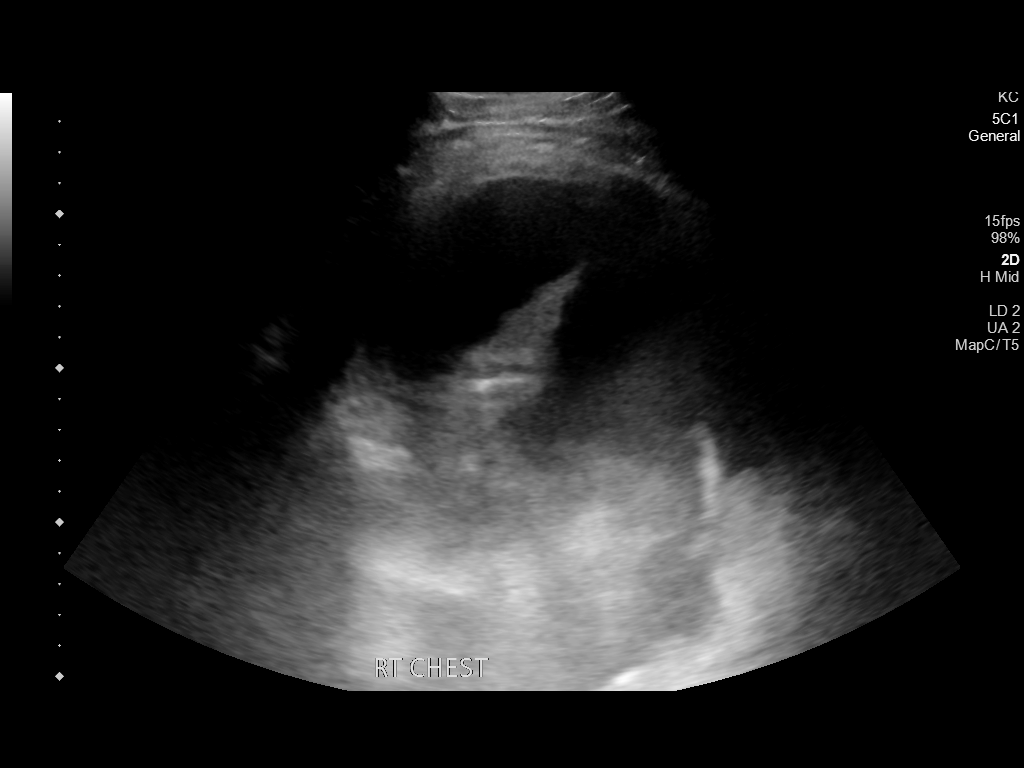
[im 4/4]
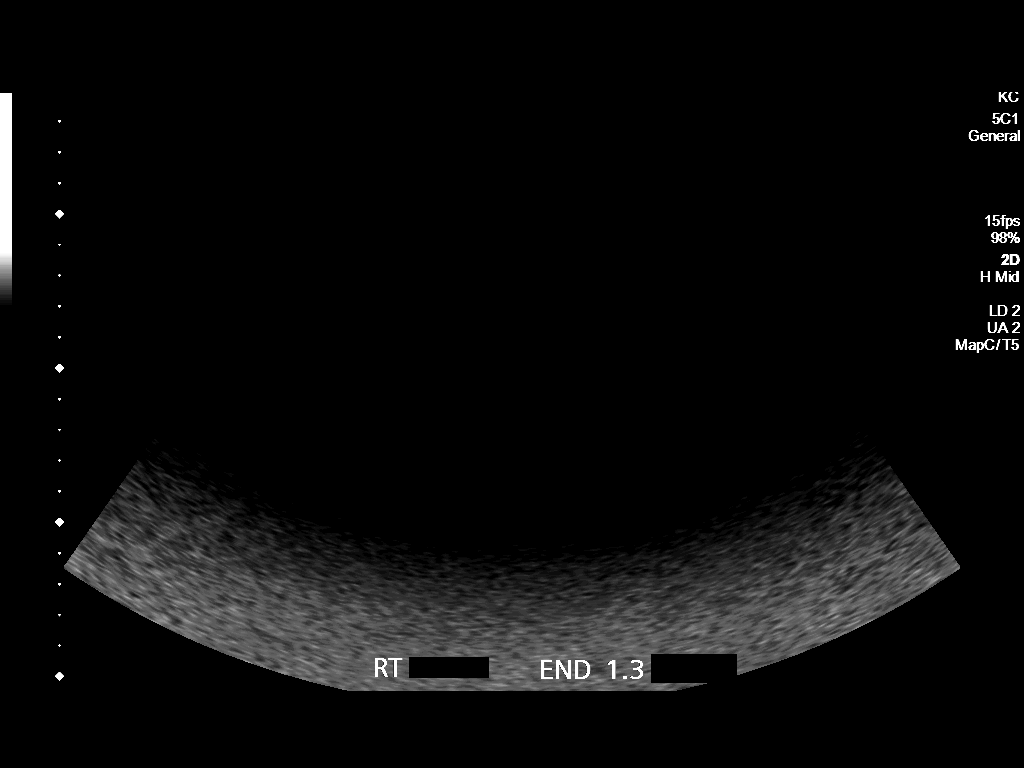

[4 of 4 positions shown; findings below may reference images not displayed]

EXAM:
ULTRASOUND GUIDED DIAGNOSTIC AND THERAPEUTIC RIGHT THORACENTESIS

MEDICATIONS:
1% lidocaine to skin and subcutaneous tissue

COMPLICATIONS:
None immediate.

PROCEDURE:
An ultrasound guided thoracentesis was thoroughly discussed with the
patient and questions answered. The benefits, risks, alternatives
and complications were also discussed. The patient understands and
wishes to proceed with the procedure. Written consent was obtained.

Ultrasound was performed to localize and mark an adequate pocket of
fluid in the right chest. The area was then prepped and draped in
the normal sterile fashion. 1% Lidocaine was used for local
anesthesia. Under ultrasound guidance a 6 Fr Safe-T-Centesis
catheter was introduced. Thoracentesis was performed. The catheter
was removed and a dressing applied.
FINDINGS: A total of approximately 1.3 liters of blood-tinged fluid was
removed. Samples were sent to the laboratory as requested by the
clinical team.
IMPRESSION: Successful ultrasound guided diagnostic and therapeutic right
thoracentesis yielding 1.3 liters of pleural fluid.

## 2021-11-16 IMAGING — DX DG CHEST 1V
1 series · 1 of 1 positions shown · non-contrast
Comparison: One-view chest x-ray 05/25/2021

CLINICAL DATA: Status post right thoracentesis.

EXAM:
CHEST  1 VIEW

[chest ap]
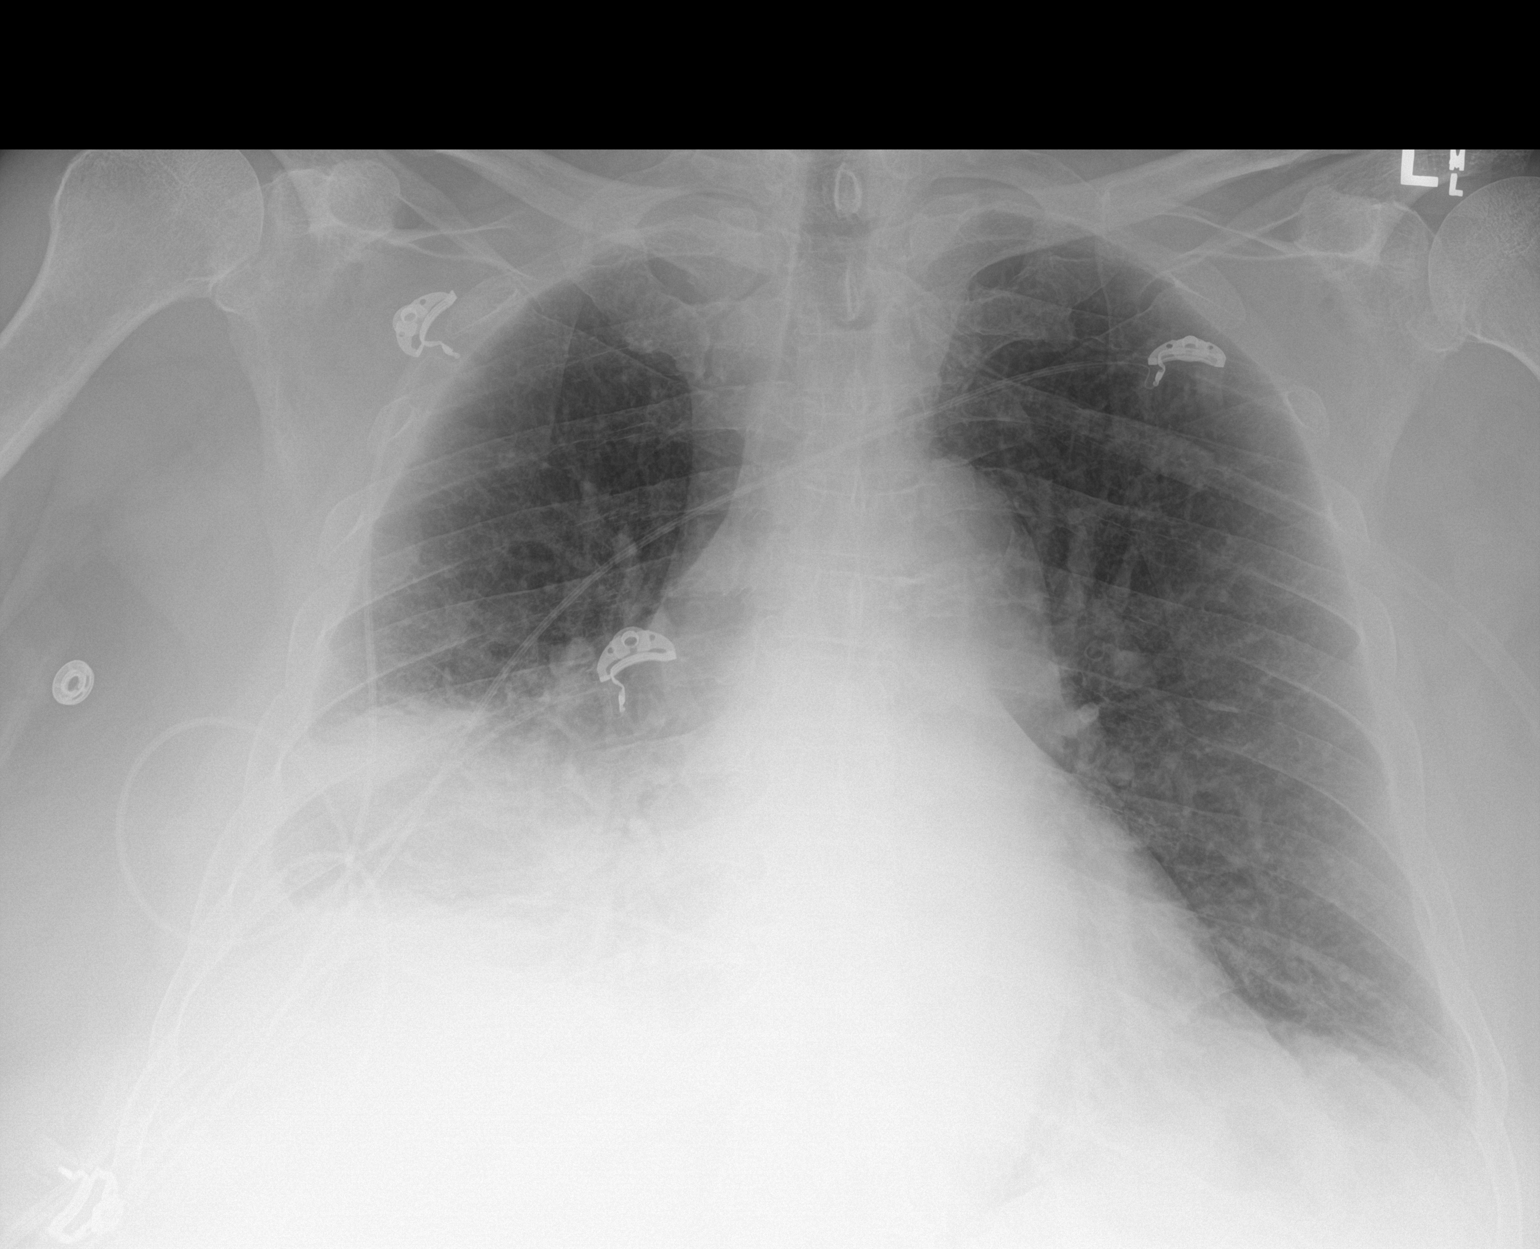

[1 of 1 positions shown; findings below may reference images not displayed]

FINDINGS: Heart is enlarged. Right pleural effusion is significantly
decreased. No pneumothorax is present. Left lung is clear. Pulmonary
vascular congestion noted.
IMPRESSION: 1. Right pleural effusion without evidence for complication.
2. Pulmonary vascular congestion.

## 2021-11-17 IMAGING — DX DG CHEST 1V PORT
1 series · 1 of 1 positions shown · non-contrast
Comparison: 05/26/2021

CLINICAL DATA: Short of breath.  Right thoracentesis yesterday

EXAM:
PORTABLE CHEST 1 VIEW

[chest ap]
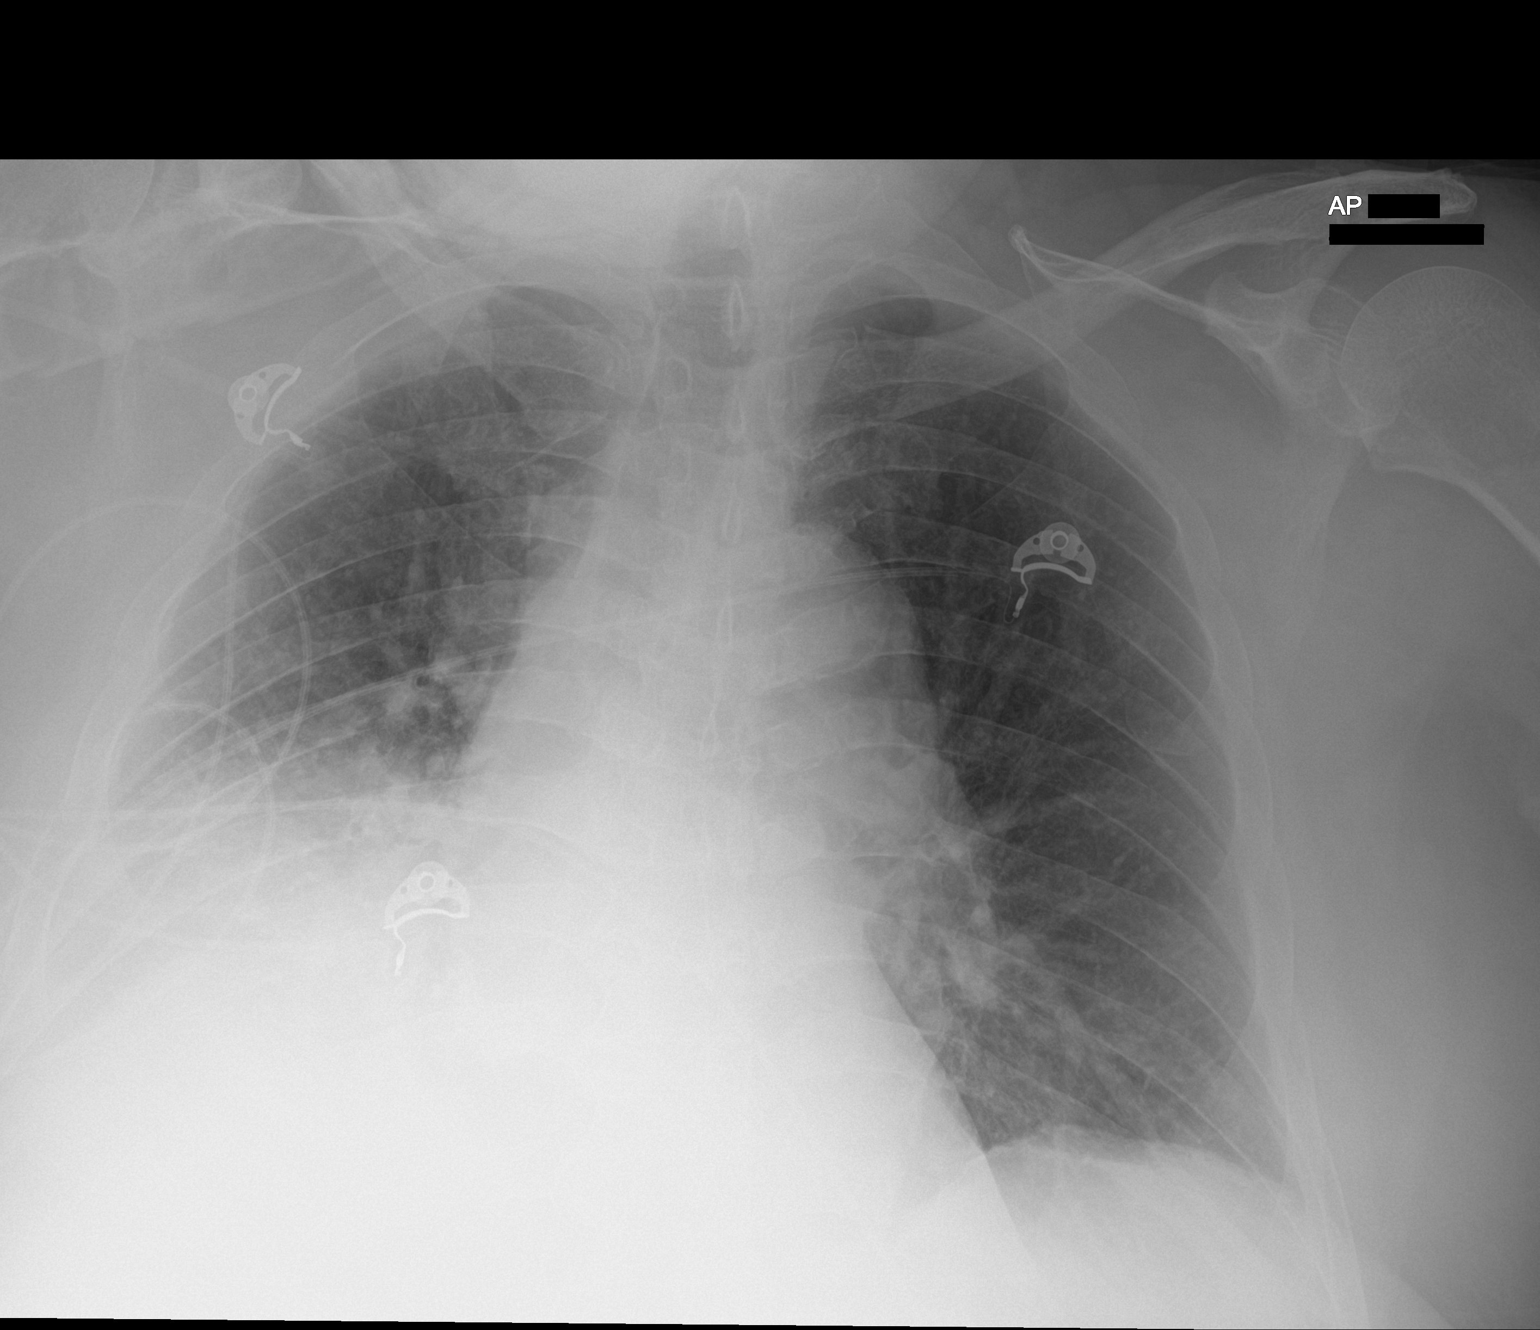

[1 of 1 positions shown; findings below may reference images not displayed]

FINDINGS: Persistent density in the right lung base may represent neoplasm
based on CT. Pneumonia possible. Small right effusion unchanged. No
pneumothorax

Left lung remains clear.
IMPRESSION: Persistent airspace density in the right lung base may represent
tumor or pneumonia. No pneumothorax.
